# Patient Record
Sex: Female | Born: 1935
Health system: Southern US, Community
[De-identification: ages and names within clinical notes are randomized; demographics above are authoritative.]

## PROBLEM LIST (undated history)

## (undated) DIAGNOSIS — Z950 Presence of cardiac pacemaker: Principal | ICD-10-CM

## (undated) DIAGNOSIS — Z9181 History of falling: Secondary | ICD-10-CM

## (undated) DIAGNOSIS — F0390 Unspecified dementia without behavioral disturbance: Secondary | ICD-10-CM

## (undated) DIAGNOSIS — I4891 Unspecified atrial fibrillation: Secondary | ICD-10-CM

## (undated) DIAGNOSIS — E039 Hypothyroidism, unspecified: Secondary | ICD-10-CM

## (undated) DIAGNOSIS — R55 Syncope and collapse: Secondary | ICD-10-CM

## (undated) DIAGNOSIS — D649 Anemia, unspecified: Secondary | ICD-10-CM

## (undated) DIAGNOSIS — I10 Essential (primary) hypertension: Secondary | ICD-10-CM

## (undated) DIAGNOSIS — Z9289 Personal history of other medical treatment: Secondary | ICD-10-CM

## (undated) DIAGNOSIS — M1990 Unspecified osteoarthritis, unspecified site: Secondary | ICD-10-CM

## (undated) HISTORY — DX: Presence of cardiac pacemaker: Z95.0

## (undated) HISTORY — PX: PACEMAKER INSERTION: SHX728

## (undated) HISTORY — PX: ABDOMINAL HYSTERECTOMY: SHX81

---

## 2011-11-04 ENCOUNTER — Encounter (HOSPITAL_BASED_OUTPATIENT_CLINIC_OR_DEPARTMENT_OTHER): Payer: Self-pay

## 2011-12-02 ENCOUNTER — Ambulatory Visit (HOSPITAL_BASED_OUTPATIENT_CLINIC_OR_DEPARTMENT_OTHER): Payer: Self-pay

## 2012-09-21 ENCOUNTER — Other Ambulatory Visit: Payer: Self-pay | Admitting: *Deleted

## 2012-09-21 DIAGNOSIS — R011 Cardiac murmur, unspecified: Secondary | ICD-10-CM

## 2012-10-02 ENCOUNTER — Ambulatory Visit (HOSPITAL_COMMUNITY)
Admission: RE | Admit: 2012-10-02 | Discharge: 2012-10-02 | Disposition: A | Discharge status: Home / Self Care | Payer: Medicare Other | Source: Ambulatory Visit | Attending: Cardiovascular Disease | Admitting: Cardiovascular Disease

## 2012-10-02 DIAGNOSIS — R011 Cardiac murmur, unspecified: Secondary | ICD-10-CM | POA: Insufficient documentation

## 2012-10-02 DIAGNOSIS — I4891 Unspecified atrial fibrillation: Secondary | ICD-10-CM | POA: Insufficient documentation

## 2012-10-02 DIAGNOSIS — R55 Syncope and collapse: Secondary | ICD-10-CM | POA: Insufficient documentation

## 2012-10-02 NOTE — Progress Notes (Signed)
2D Echo Performed 10/02/2012    Auset Fritzler, RCS  

## 2012-10-05 ENCOUNTER — Encounter: Payer: Self-pay | Admitting: Cardiovascular Disease

## 2012-11-05 ENCOUNTER — Emergency Department (HOSPITAL_COMMUNITY): Payer: Medicare Other

## 2012-11-05 ENCOUNTER — Emergency Department (HOSPITAL_COMMUNITY)
Admission: EM | Admit: 2012-11-05 | Discharge: 2012-11-05 | Disposition: A | Discharge status: Home / Self Care | Payer: Medicare Other | Attending: Emergency Medicine | Admitting: Emergency Medicine

## 2012-11-05 ENCOUNTER — Encounter (HOSPITAL_COMMUNITY): Payer: Self-pay | Admitting: *Deleted

## 2012-11-05 DIAGNOSIS — Z880 Allergy status to penicillin: Secondary | ICD-10-CM | POA: Insufficient documentation

## 2012-11-05 DIAGNOSIS — R51 Headache: Secondary | ICD-10-CM | POA: Insufficient documentation

## 2012-11-05 DIAGNOSIS — Z950 Presence of cardiac pacemaker: Secondary | ICD-10-CM | POA: Insufficient documentation

## 2012-11-05 DIAGNOSIS — R42 Dizziness and giddiness: Secondary | ICD-10-CM | POA: Insufficient documentation

## 2012-11-05 DIAGNOSIS — Z79899 Other long term (current) drug therapy: Secondary | ICD-10-CM | POA: Insufficient documentation

## 2012-11-05 DIAGNOSIS — R000 Tachycardia, unspecified: Secondary | ICD-10-CM | POA: Insufficient documentation

## 2012-11-05 DIAGNOSIS — I4891 Unspecified atrial fibrillation: Secondary | ICD-10-CM | POA: Insufficient documentation

## 2012-11-05 DIAGNOSIS — M2550 Pain in unspecified joint: Secondary | ICD-10-CM | POA: Insufficient documentation

## 2012-11-05 DIAGNOSIS — Z7901 Long term (current) use of anticoagulants: Secondary | ICD-10-CM | POA: Insufficient documentation

## 2012-11-05 DIAGNOSIS — Z87891 Personal history of nicotine dependence: Secondary | ICD-10-CM | POA: Insufficient documentation

## 2012-11-05 DIAGNOSIS — M542 Cervicalgia: Secondary | ICD-10-CM | POA: Insufficient documentation

## 2012-11-05 DIAGNOSIS — I10 Essential (primary) hypertension: Secondary | ICD-10-CM | POA: Insufficient documentation

## 2012-11-05 HISTORY — DX: Unspecified atrial fibrillation: I48.91

## 2012-11-05 HISTORY — DX: Essential (primary) hypertension: I10

## 2012-11-05 HISTORY — DX: Syncope and collapse: R55

## 2012-11-05 LAB — CBC WITH DIFFERENTIAL/PLATELET
Basophils Absolute: 0 10*3/uL (ref 0.0–0.1)
Basophils Relative: 0 % (ref 0–1)
Eosinophils Absolute: 0.2 10*3/uL (ref 0.0–0.7)
HCT: 46.4 % — ABNORMAL HIGH (ref 36.0–46.0)
Hemoglobin: 15.7 g/dL — ABNORMAL HIGH (ref 12.0–15.0)
MCHC: 33.8 g/dL (ref 30.0–36.0)
MCV: 92.1 fL (ref 78.0–100.0)
Monocytes Absolute: 1.1 10*3/uL — ABNORMAL HIGH (ref 0.1–1.0)
Monocytes Relative: 9 % (ref 3–12)
Neutro Abs: 7 10*3/uL (ref 1.7–7.7)
Platelets: 239 10*3/uL (ref 150–400)
RDW: 13.5 % (ref 11.5–15.5)
WBC: 11.7 10*3/uL — ABNORMAL HIGH (ref 4.0–10.5)

## 2012-11-05 LAB — BASIC METABOLIC PANEL
BUN: 22 mg/dL (ref 6–23)
Calcium: 9 mg/dL (ref 8.4–10.5)
Chloride: 97 mEq/L (ref 96–112)
Creatinine, Ser: 0.81 mg/dL (ref 0.50–1.10)
GFR calc Af Amer: 71 mL/min — ABNORMAL LOW (ref >90)
GFR calc non Af Amer: 62 mL/min — ABNORMAL LOW (ref >90)
Sodium: 134 mEq/L — ABNORMAL LOW (ref 135–145)

## 2012-11-05 LAB — MAGNESIUM: Magnesium: 2.2 mg/dL (ref 1.5–2.5)

## 2012-11-05 MED ORDER — SODIUM CHLORIDE 0.9 % IV BOLUS (SEPSIS)
500.0000 mL | Freq: Once | INTRAVENOUS | Status: AC
  Administered 2012-11-05: 500 mL via INTRAVENOUS

## 2012-11-05 MED ORDER — DILTIAZEM HCL 25 MG/5ML IV SOLN
15.0000 mg | Freq: Once | INTRAVENOUS | Status: AC
  Administered 2012-11-05: 15 mg via INTRAVENOUS
  Filled 2012-11-05: qty 5

## 2012-11-05 MED ORDER — DEXTROSE 5 % IV SOLN
5.0000 mg/h | INTRAVENOUS | Status: DC
  Filled 2012-11-05: qty 100

## 2012-11-05 MED ORDER — MORPHINE SULFATE 2 MG/ML IJ SOLN
2.0000 mg | Freq: Once | INTRAMUSCULAR | Status: AC
  Administered 2012-11-05: 2 mg via INTRAVENOUS
  Filled 2012-11-05: qty 1

## 2012-11-05 NOTE — ED Notes (Signed)
Dr Alanda Amass repaged about pt. Family has inquired again about delay in getting out of dept

## 2012-11-05 NOTE — ED Notes (Signed)
EDP notified that Family and Pt. Refuse Head CT . Pt also refuses Morphine for Pain.

## 2012-11-05 NOTE — ED Notes (Signed)
Pt is on Xaralto for atrial fibrillation

## 2012-11-05 NOTE — ED Provider Notes (Signed)
CSN: [id number]     Arrival date & time 11/05/12  1456 History   First MD Initiated Contact with Patient 11/05/12 1513     Chief Complaint  Patient presents with  . Near Syncope  . Headache  . Neck Pain   (Consider location/radiation/quality/duration/timing/severity/associated sxs/prior Treatment) HPI Comments: 79 yo female with pacemaker placed 2 yrs prior, a fib hx, on xaralto, pacemaker checked 3 wks ago and was okay per pt, Dr Alanda Amass cardio and presents with fatigue and lightheaded for 2 days.  She has had chronic neck pain since fusion/ arthritis however left neck pain worsening and left HA at base new recently, gradual onset.  No new injuries. No syncope. Pt felt similar prior to pacemaker. No cp or sob.  No recent surgery.  Patient is a 79 y.o. female presenting with headaches and neck pain. The history is provided by the patient.  Headache Associated symptoms: fatigue and neck pain   Associated symptoms: no abdominal pain, no back pain, no fever, no neck stiffness, no numbness and no vomiting   Neck Pain Associated symptoms: headaches   Associated symptoms: no chest pain, no fever, no numbness and no weakness     Past Medical History  Diagnosis Date  . Hypertension   . Atrial fibrillation   . Syncope    Past Surgical History  Procedure Laterality Date  . Pacemaker insertion    . Abdominal hysterectomy     No family history on file. History  Substance Use Topics  . Smoking status: Former Games developer  . Smokeless tobacco: Not on file  . Alcohol Use: Yes     Comment: occ   OB History   Grav Para Term Preterm Abortions TAB SAB Ect Mult Living                 Review of Systems  Constitutional: Positive for fatigue. Negative for fever and chills.  HENT: Positive for neck pain. Negative for neck stiffness.   Eyes: Negative for visual disturbance.  Respiratory: Negative for shortness of breath.   Cardiovascular: Negative for chest pain.  Gastrointestinal: Negative for  vomiting and abdominal pain.  Genitourinary: Negative for dysuria and flank pain.  Musculoskeletal: Positive for arthralgias. Negative for back pain.  Skin: Negative for rash.  Neurological: Positive for light-headedness and headaches. Negative for speech difficulty, weakness and numbness.    Allergies  Penicillins  Home Medications   Current Outpatient Rx  Name  Route  Sig  Dispense  Refill  . atenolol (TENORMIN) 100 MG tablet   Oral   Take 100 mg by mouth daily.         . Rivaroxaban (XARELTO) 20 MG TABS tablet   Oral   Take 20 mg by mouth daily.         . valsartan (DIOVAN) 40 MG tablet   Oral   Take 40 mg by mouth daily.          BP 148/88  Pulse 89  Temp(Src) 97.4 F (36.3 C) (Oral)  Resp 20  SpO2 97% Physical Exam  Nursing note and vitals reviewed. Constitutional: She is oriented to person, place, and time. She appears well-developed and well-nourished.  HENT:  Head: Normocephalic and atraumatic.  Eyes: Conjunctivae are normal. Right eye exhibits no discharge. Left eye exhibits no discharge.  Neck: Normal range of motion. Neck supple. No tracheal deviation present.  Cardiovascular: An irregularly irregular rhythm present. Tachycardia present.   Pulses:      Radial pulses are 2+  on the right side, and 2+ on the left side.  Pulmonary/Chest: Effort normal and breath sounds normal.  Abdominal: Soft. She exhibits no distension. There is no tenderness. There is no guarding.  Musculoskeletal: She exhibits tenderness (left paraspinal neck). She exhibits no edema.  Neurological: She is alert and oriented to person, place, and time. No cranial nerve deficit.  Skin: Skin is warm. No rash noted.  Psychiatric: She has a normal mood and affect.    ED Course  Procedures (including critical care time) Labs Review Labs Reviewed  CBC WITH DIFFERENTIAL - Abnormal; Notable for the following:    WBC 11.7 (*)    Hemoglobin 15.7 (*)    HCT 46.4 (*)    Monocytes  Absolute 1.1 (*)    All other components within normal limits  BASIC METABOLIC PANEL - Abnormal; Notable for the following:    Sodium 134 (*)    Glucose, Bld 104 (*)    GFR calc non Af Amer 62 (*)    GFR calc Af Amer 71 (*)    All other components within normal limits  MAGNESIUM  TROPONIN I   Imaging Review No results found.  MDM  No diagnosis found. Concern for a fib and/ or pacemaker malfunction.   Date: 11/05/2012  Rate: 132  Rhythm: atrial fibrillation  QRS Axis: left  Intervals: QT prolonged  ST/T Wave abnormalities: nonspecific ST changes  Conduction Disutrbances:irregular, occcasional atrial spikes  Narrative Interpretation:   Old EKG Reviewed: changes noted   Cardizem bolus improved hr to 70s, pt sxs resolved Fluid bolus and cardio consult.   Rhythm strip reviewed HR varying 120 to 140s, no acute ST changes.   Pt on xaralto. Dg Chest Portable 1 View  11/05/2012   CLINICAL DATA:  Near syncope. Headache and neck pain  EXAM: PORTABLE CHEST - 1 VIEW  COMPARISON:  None  FINDINGS: Dual lead pacemaker in satisfactory position. Heart size upper normal. Negative for heart failure. Negative for mass, pneumonia, or effusion. Lungs are hyperinflated.  IMPRESSION: COPD. No acute abnormality.   Electronically Signed   By: Marlan Palau M.D.   On: 11/05/2012 16:16   Spoke with cardiology on call for [facility], recommended outpt fup in next two days, take medicines as directed.  Pt does not want to be observed for hr increase, family will ensure close fup.  DC   Enid Skeens, MD 11/05/12 1757

## 2012-11-05 NOTE — ED Notes (Signed)
Dr Jodi Mourning in to speak with pt and her family about dispo

## 2012-11-05 NOTE — ED Notes (Signed)
Pt was out of town yesterday and did not want to eat and slept.  Pt got up to shower and started feeling like she was going to pass out.  Pt feels like when she stands up she is going to pass out.  Pt complaining of headache this am from left posterior head down into her neck.  Pt has pacemaker

## 2012-11-05 NOTE — ED Notes (Signed)
Pt moved to pod c for further observation and to have her ct's done

## 2012-11-06 ENCOUNTER — Ambulatory Visit (INDEPENDENT_AMBULATORY_CARE_PROVIDER_SITE_OTHER): Payer: Medicare Other | Admitting: Cardiology

## 2012-11-06 ENCOUNTER — Telehealth (HOSPITAL_COMMUNITY): Payer: Self-pay | Admitting: Cardiovascular Disease

## 2012-11-06 ENCOUNTER — Encounter: Payer: Self-pay | Admitting: Cardiology

## 2012-11-06 ENCOUNTER — Ambulatory Visit: Payer: Medicare Other | Admitting: Cardiology

## 2012-11-06 VITALS — BP 126/72 | HR 130 | Ht 67.0 in | Wt 109.1 lb

## 2012-11-06 DIAGNOSIS — R000 Tachycardia, unspecified: Secondary | ICD-10-CM

## 2012-11-06 DIAGNOSIS — I4891 Unspecified atrial fibrillation: Secondary | ICD-10-CM

## 2012-11-06 DIAGNOSIS — I480 Paroxysmal atrial fibrillation: Secondary | ICD-10-CM

## 2012-11-06 LAB — PACEMAKER DEVICE OBSERVATION

## 2012-11-06 MED ORDER — ATENOLOL 100 MG PO TABS: 150.0000 mg | ORAL_TABLET | Freq: Every day | ORAL | Status: DC

## 2012-11-06 MED ORDER — ATENOLOL 100 MG PO TABS: 150.0000 mg | ORAL_TABLET | Freq: Two times a day (BID) | ORAL | Status: DC

## 2012-11-06 NOTE — Assessment & Plan Note (Addendum)
Was seen in the ER, treated but today her HR is 130-150.  She is on Xarelto.  Atenolol increased to 100 BID today  And will begin 150 mg daily tomorrow.  She will follow up in 2 weeks unless further problems.  We did discuss admitting but both the pt and her son did not want her to go back to the hospital.

## 2012-11-06 NOTE — Patient Instructions (Addendum)
Atenolol increase to 150 mg daily,  But for today only take a total of 200 mg.    Follow up, we will call you.  2 weeks with an extender.

## 2012-11-06 NOTE — Progress Notes (Signed)
Pt was seen in the ER yesterday and after cardizem bolus with decrease of new atrial fib to rate controlled a fib of 70 from 150, pt was discharged.  She is on Xarelto.  Today she is in a fib with RVR.  Pacemaker interrogated.  Dr.Weintraub here, he saw the pt and assumed care.  We increased her atenolol and she will follow up in 2 weeks unless further problems.

## 2012-11-06 NOTE — Telephone Encounter (Signed)
Patient seen in ER yesterday with increased heart rate.  Was given medication to slow it down and told to follow up today.  Mr. Fuerte says patient is dizzy and gets faint when she tries to move around.  Not sure he can get her here.

## 2012-11-06 NOTE — Telephone Encounter (Signed)
Returned call to Mr. Fiscus.  Bad connection.  Called [phone] per request.  Stated pt seen in ER yesterday for high HR (120s-150s).  Stated she was given fluids and meds and HR down to 70s-80s.  Stated pt has a pacemaker and isn't sure if something is going on with it.  Stated only new change in pt's life is being started on Valsartan at her last appt and taking prednisone for neck pain.  Stated BP has been in 140s.  Stated pt still c/o dizziness.  Informed pt should be evaluated and offered appt today w/ PA since Dr. Alanda Amass has no openings.  Agreed and appt scheduled w/ Corine Shelter, PA-C at 2:20pm for evaluation.  Mr. Wieczorek agreed to call back with any changes in pt's condition.  Informed Dr. Alanda Amass will be notified as well.  Verbalized understanding.  Available on mobile number if needed throughout the day.    [phone]

## 2012-11-10 ENCOUNTER — Telehealth: Payer: Self-pay | Admitting: Physician Assistant

## 2012-11-10 ENCOUNTER — Telehealth: Payer: Self-pay | Admitting: Cardiology

## 2012-11-10 NOTE — Telephone Encounter (Signed)
Needs some additional information about her visit with Nada Boozer, NP on Monday of this week.  Her medications were changed, but she has the dizziness.  Please advise.

## 2012-11-10 NOTE — Telephone Encounter (Signed)
BP and HR look good.   Continue current atenolol dosing.  Stay hydrated.  Follow up with Ms. Ingold as scheduled.  Antero Derosia 2:40 PM

## 2012-11-10 NOTE — Telephone Encounter (Signed)
Returned call to [place], pt's daughter-in-law.  Statd pt's med was increased at last visit (atenolol) and pt has been c/o "head feels swimmy" all week.  Stated pt c/o feeling light-headed when she's walking sometimes.  Denied checking pt's BP, but does have the means.  RN advised she check pt's BP when she goes to see her for lunch and call back w/ BP and HR.  Orthostatic instructions given as well since she described pt w/ some orthostatic symptoms.  Also advised increase in water intake as dehydration can cause similar symptoms.  F/u appt scheduled for Tuesday, 9.30.14, at 2:20pm w/ Vernona Rieger, NP.    Eunice Blase will call back w/ BP and HR so nurse/CMA can notify provider today for advice on meds.

## 2012-11-10 NOTE — Telephone Encounter (Signed)
Opened in error

## 2012-11-10 NOTE — Telephone Encounter (Signed)
Blood pressure reading 114/96 pulse is 77

## 2012-11-14 ENCOUNTER — Ambulatory Visit: Payer: Medicare Other | Admitting: Cardiology

## 2012-11-20 ENCOUNTER — Ambulatory Visit (INDEPENDENT_AMBULATORY_CARE_PROVIDER_SITE_OTHER): Payer: Medicare Other | Admitting: Cardiology

## 2012-11-20 ENCOUNTER — Encounter: Payer: Self-pay | Admitting: Cardiology

## 2012-11-20 VITALS — BP 132/80 | HR 85 | Ht 67.0 in | Wt 110.0 lb

## 2012-11-20 DIAGNOSIS — I480 Paroxysmal atrial fibrillation: Secondary | ICD-10-CM

## 2012-11-20 DIAGNOSIS — Z7901 Long term (current) use of anticoagulants: Secondary | ICD-10-CM | POA: Insufficient documentation

## 2012-11-20 DIAGNOSIS — I4891 Unspecified atrial fibrillation: Secondary | ICD-10-CM

## 2012-11-20 NOTE — Assessment & Plan Note (Addendum)
Now back in SR.   BP stable with increased dose of BB.  No complaints today.

## 2012-11-20 NOTE — Progress Notes (Signed)
          11/20/2012   PCP: Delorse Lek, MD   Chief Complaint  Patient presents with  . Follow-up    2 week follow up    Primary Cardiologist: Now Dr. Eula Fried, Had been Dr. Alanda Amass.  HPI:79 year old was seen last week for afib with RVR, she was very weak and off balance.  She had been to the ER the night before the visit and IV cardizem slowed her rate, but on arrival in office it was elevated again.  She was seen and evaluated by Dr. Alanda Amass.  He discussed with her son hospitalization, but the pt did not want to be admitted.  We increased her atenolol and she has since converted to SR.   She has PPM for sick sinus syndrome.  She is also on Xarelto for anticoagulation.  Today she has no complaints.  Feels and looks much better.  No chest pain, no shortness of breath.   Allergies  Allergen Reactions  . Penicillins Other (See Comments)    unknown    Current Outpatient Prescriptions  Medication Sig Dispense Refill  . atenolol (TENORMIN) 100 MG tablet Take 1.5 tablets (150 mg total) by mouth daily.  150 tablet  3  . Rivaroxaban (XARELTO) 20 MG TABS tablet Take 15 mg by mouth daily.       . valsartan (DIOVAN) 40 MG tablet Take 40 mg by mouth daily.       No current facility-administered medications for this visit.    Past Medical History  Diagnosis Date  . Hypertension   . Atrial fibrillation   . Syncope     Past Surgical History  Procedure Laterality Date  . Pacemaker insertion    . Abdominal hysterectomy      OZH:YQMVHQI:ON colds or fevers, no weight changes, feels much better Skin:no rashes or ulcers HEENT:no blurred vision, no congestion CV:see HPI PUL:see HPI GI:no diarrhea constipation or melena, no indigestion GU:no hematuria, no dysuria MS:no joint pain, no claudication Neuro:no syncope, no lightheadedness Endo:no diabetes, no thyroid disease  PHYSICAL EXAM BP 132/80  Pulse 85  Ht 5\' 7"  (1.702 m)  Wt 110 lb (49.896 kg)  BMI 17.22  kg/m2 General:Pleasant affect, NAD Skin:Warm and dry, brisk capillary refill HEENT:normocephalic, sclera clear, mucus membranes moist Heart:S1S2 RRR with soft systolic murmur, no gallup, rub or click Lungs:clear without rales, rhonchi, or wheezes GEX:BMWU, non tender, + BS, do not palpate liver spleen or masses Neuro:alert and oriented, MAE, follows commands, + facial symmetry XLK:GMWNUU, SR rate controlled.  ASSESSMENT AND PLAN PAF (paroxysmal atrial fibrillation) Now back in SR.   BP stable with increased dose of BB.  No complaints today.   She will follow up with Dr. Royann Shivers in 2 months unless problems in the meantime.

## 2012-11-20 NOTE — Patient Instructions (Signed)
You are now in regular Sinus rhythm with pacing normally.  We will have you follow up with Dr. Royann Shivers in 2 months for follow up.

## 2012-12-19 ENCOUNTER — Telehealth: Payer: Self-pay | Admitting: Internal Medicine

## 2012-12-19 NOTE — Telephone Encounter (Signed)
Pt has appt with Dr C on 01-22-13/mt

## 2013-01-08 ENCOUNTER — Encounter: Payer: Self-pay | Admitting: *Deleted

## 2013-01-22 ENCOUNTER — Ambulatory Visit (INDEPENDENT_AMBULATORY_CARE_PROVIDER_SITE_OTHER): Payer: Medicare Other | Admitting: Cardiovascular Disease

## 2013-01-22 ENCOUNTER — Encounter: Payer: Self-pay | Admitting: Cardiovascular Disease

## 2013-01-22 VITALS — BP 150/88 | HR 76 | Resp 20 | Ht 67.0 in | Wt 113.4 lb

## 2013-01-22 DIAGNOSIS — I4891 Unspecified atrial fibrillation: Secondary | ICD-10-CM

## 2013-01-22 DIAGNOSIS — Z950 Presence of cardiac pacemaker: Secondary | ICD-10-CM

## 2013-01-22 DIAGNOSIS — I495 Sick sinus syndrome: Secondary | ICD-10-CM

## 2013-01-22 DIAGNOSIS — I480 Paroxysmal atrial fibrillation: Secondary | ICD-10-CM

## 2013-01-22 LAB — MDC_IDC_ENUM_SESS_TYPE_INCLINIC
Lead Channel Impedance Value: 456 Ohm
Lead Channel Pacing Threshold Amplitude: 0.9 V
Lead Channel Pacing Threshold Pulse Width: 0.4 ms
Lead Channel Sensing Intrinsic Amplitude: 2 mV
Lead Channel Sensing Intrinsic Amplitude: 4.8 mV
Lead Channel Setting Sensing Sensitivity: 1 mV
Zone Setting Detection Interval: 316 ms

## 2013-01-22 LAB — PACEMAKER DEVICE OBSERVATION

## 2013-01-22 NOTE — Patient Instructions (Signed)
Remote monitoring is used to monitor your pacemaker from home. This monitoring reduces the number of office visits required to check your device to one time per year. It allows [place] to keep an eye on the functioning of your device to ensure it is working properly. You are scheduled for a device check from home on 04-25-2013. You may send your transmission at any time that day. If you have a wireless device, the transmission will be sent automatically. After your physician reviews your transmission, you will receive a postcard with your next transmission date.  Your physician recommends that you schedule a follow-up appointment in: 6 months

## 2013-01-25 ENCOUNTER — Encounter: Payer: Self-pay | Admitting: Cardiovascular Disease

## 2013-01-25 DIAGNOSIS — Z950 Presence of cardiac pacemaker: Secondary | ICD-10-CM

## 2013-01-25 HISTORY — DX: Presence of cardiac pacemaker: Z95.0

## 2013-01-25 NOTE — Progress Notes (Signed)
Patient ID: Jamie Daugherty, female   DOB: 17-Dec-1918, 79 y.o.   MRN: [id number]      Reason for office visit Atrial fibrillation, pacemaker followup  Jamie Daugherty is a former patient of Dr. Susa Griffins. She is accompanied by her son. She is now almost 47 years old. She appears relatively frail but takes care of most of her day-to-day activities independently. She had a syncopal event roughly 1-1/2 years ago and was found to have atrial flutter ablation rapid ventricular response. Medications list to marked symptomatic bradycardia and she received a Administrator in July of 2013. Since that time she has not had syncope. Jamie Daugherty saw Dr. Alanda Amass she was in a protracted episode of atrial fibrillation but this has subsequently resolved. The overall burden of atrial fibrillation is only around 1%. Rate control is good. A few episodes of nonsustained ventricular tachycardia have been recorded, all very brief. She has not had any bleeding problems and has not had falls or injuries. He does not have a history of stroke or other embolic events. Echocardiography performed in August showed normal left ventricular systolic function, moderate mitral insufficiency and a moderately dilated left atrium(the diameter was only 39 mm, but the volume was 40 mL per meter squared BSA)   Allergies  Allergen Reactions  . Penicillins Other (See Comments)    unknown    Current Outpatient Prescriptions  Medication Sig Dispense Refill  . atenolol (TENORMIN) 100 MG tablet Take 1.5 tablets (150 mg total) by mouth daily.  150 tablet  3  . Rivaroxaban (XARELTO) 20 MG TABS tablet Take 15 mg by mouth daily.       . valsartan (DIOVAN) 40 MG tablet Take 40 mg by mouth daily.       No current facility-administered medications for this visit.    Past Medical History  Diagnosis Date  . Hypertension   . Atrial fibrillation   . Syncope   . Pacemaker - dual chamber [facility] implanted  2013 01/25/2013    Past Surgical History  Procedure Laterality Date  . Pacemaker insertion    . Abdominal hysterectomy      Family History  Problem Relation Age of Onset  . Heart disease Mother   . Heart disease Father     History   Social History  . Marital Status: Unknown    Spouse Name: N/A    Number of Children: N/A  . Years of Education: N/A   Occupational History  . Not on file.   Social History Main Topics  . Smoking status: Former Games developer  . Smokeless tobacco: Not on file  . Alcohol Use: Yes     Comment: occ  . Drug Use: No  . Sexual Activity: Not on file   Other Topics Concern  . Not on file   Social History Narrative  . No narrative on file  Review of systems: The patient specifically denies any chest pain at rest or with exertion, dyspnea at rest or with exertion, orthopnea, paroxysmal nocturnal dyspnea, syncope, palpitations, focal neurological deficits, intermittent claudication, lower extremity edema, unexplained weight gain, cough, hemoptysis or wheezing.  The patient also denies abdominal pain, nausea, vomiting, dysphagia, diarrhea, constipation, polyuria, polydipsia, dysuria, hematuria, frequency, urgency, abnormal bleeding or bruising, fever, chills, unexpected weight changes, mood swings, change in skin or hair texture, change in voice quality, auditory or visual problems, allergic reactions or rashes, new musculoskeletal complaints other than usual "aches and pains".   PHYSICAL EXAM BP 150/88  Pulse 76  Resp 20  Ht 5\' 7"  (1.702 m)  Wt 113 lb 6.4 oz (51.438 kg)  BMI 17.76 kg/m2  General: Alert, oriented x3, no distress, very slender and frail appearing Head: no evidence of trauma, PERRL, EOMI, no exophtalmos or lid lag, no myxedema, no xanthelasma; normal ears, nose and oropharynx Neck: normal jugular venous pulsations and no hepatojugular reflux; brisk carotid pulses without delay and no carotid bruits Chest: clear to auscultation, no signs  of consolidation by percussion or palpation, normal fremitus, symmetrical and full respiratory excursions; they didn't pacemaker site appears healthy Cardiovascular: normal position and quality of the apical impulse, regular rhythm, normal first and second heart sounds, no murmurs, rubs or gallops Abdomen: no tenderness or distention, no masses by palpation, no abnormal pulsatility or arterial bruits, normal bowel sounds, no hepatosplenomegaly Extremities: no clubbing, cyanosis or edema; 2+ radial, ulnar and brachial pulses bilaterally; 2+ right femoral, posterior tibial and dorsalis pedis pulses; 2+ left femoral, posterior tibial and dorsalis pedis pulses; no subclavian or femoral bruits Neurological: grossly nonfocal   EKG: Atrial paced ventricular sensed  Lipid Panel  No results found for this basename: chol, trig, hdl, cholhdl, vldl, ldlcalc    BMET    Component Value Date/Time   NA 134* 11/05/2012 1606   K 3.5 11/05/2012 1606   CL 97 11/05/2012 1606   CO2 26 11/05/2012 1606   GLUCOSE 104* 11/05/2012 1606   BUN 22 11/05/2012 1606   CREATININE 0.81 11/05/2012 1606   CALCIUM 9.0 11/05/2012 1606   GFRNONAA 62* 11/05/2012 1606   GFRAA 71* 11/05/2012 1606     ASSESSMENT AND PLAN Pacemaker - dual chamber [employer] implanted 2013 Pacemaker check in clinic. Normal device function. Thresholds and impedances consistent with previous measurements. Device programmed to maximize longevity.  Roughly 65% atrial pacing and 25% ventricular pacing. (42) mode switch episodes <1 min, (10) lasting 1 min-1 hr, (4) lasting 1hr-24 hrs, (2) lasting 24hrs-48hrs, (1)>48 hours that occurred in September. 3 high ventricular rates noted---NSVT noted x 10bts.  RV auto threshold was D/C after an inaccurate reading. A fixed output of 2.5V @ 0.66ms was programmed.  Ventricularensitivity was also increased to 1.91mV after measuring an R wave of only 2.71mV.  Histogram distribution appropriate for patient activity  level. Device programmed to optimize intrinsic conduction.  Estimated longevity 6 years. Patient will follow up remotely on 04-25-2013 and with MD in 6 months.   PAF (paroxysmal atrial fibrillation) Overall burden of atrial fibrillation appears to be low and it is not particular symptomatic. Ventricular rate control is good. She is on appropriate anticoagulation therapy. We discussed the fact that the anticoagulant should be discontinued 24 hours before minor procedures that involve bleeding risk and 48 hours before major procedures.  Orders Placed This Encounter  Procedures  . Implantable device check   Donnie Gedeon  Thurmon Fair, MD, [facility] [phone] office [phone] pager

## 2013-01-25 NOTE — Assessment & Plan Note (Signed)
Overall burden of atrial fibrillation appears to be low and it is not particular symptomatic. Ventricular rate control is good. She is on appropriate anticoagulation therapy. We discussed the fact that the anticoagulant should be discontinued 24 hours before minor procedures that involve bleeding risk and 48 hours before major procedures.

## 2013-01-25 NOTE — Assessment & Plan Note (Signed)
Pacemaker check in clinic. Normal device function. Thresholds and impedances consistent with previous measurements. Device programmed to maximize longevity.  Roughly 65% atrial pacing and 25% ventricular pacing. (42) mode switch episodes <1 min, (10) lasting 1 min-1 hr, (4) lasting 1hr-24 hrs, (2) lasting 24hrs-48hrs, (1)>48 hours that occurred in September. 3 high ventricular rates noted---NSVT noted x 10bts.  RV auto threshold was D/C after an inaccurate reading. A fixed output of 2.5V @ 0.75ms was programmed.  Ventricularensitivity was also increased to 1.37mV after measuring an R wave of only 2.39mV.  Histogram distribution appropriate for patient activity level. Device programmed to optimize intrinsic conduction.  Estimated longevity 6 years. Patient will follow up remotely on 04-25-2013 and with MD in 6 months.

## 2013-03-09 LAB — PACEMAKER DEVICE OBSERVATION

## 2013-04-25 ENCOUNTER — Ambulatory Visit (INDEPENDENT_AMBULATORY_CARE_PROVIDER_SITE_OTHER): Payer: Medicare Other | Admitting: *Deleted

## 2013-04-25 DIAGNOSIS — I480 Paroxysmal atrial fibrillation: Secondary | ICD-10-CM

## 2013-04-25 DIAGNOSIS — I4891 Unspecified atrial fibrillation: Secondary | ICD-10-CM

## 2013-04-25 DIAGNOSIS — Z950 Presence of cardiac pacemaker: Secondary | ICD-10-CM

## 2013-04-25 LAB — PACEMAKER DEVICE OBSERVATION

## 2013-05-02 LAB — MDC_IDC_ENUM_SESS_TYPE_REMOTE
Brady Statistic RA Percent Paced: 64 %
Implantable Pulse Generator Serial Number: 131216
Lead Channel Impedance Value: 632 Ohm
Lead Channel Sensing Intrinsic Amplitude: 2.4 mV
Lead Channel Setting Pacing Amplitude: 2.5 V
Lead Channel Setting Pacing Amplitude: 2.6 V
Lead Channel Setting Pacing Pulse Width: 0.4 ms
Lead Channel Setting Sensing Sensitivity: 1 mV
MDC IDC MSMT LEADCHNL RA SENSING INTR AMPL: 4.6 mV
MDC IDC MSMT LEADCHNL RV IMPEDANCE VALUE: 391 Ohm
MDC IDC SET ZONE DETECTION INTERVAL: 316 ms
MDC IDC STAT BRADY RV PERCENT PACED: 6 %

## 2013-05-18 ENCOUNTER — Encounter: Payer: Self-pay | Admitting: *Deleted

## 2013-06-21 ENCOUNTER — Telehealth: Payer: Self-pay | Admitting: Cardiovascular Disease

## 2013-06-21 MED ORDER — RIVAROXABAN 15 MG PO TABS: 15.0000 mg | ORAL_TABLET | Freq: Two times a day (BID) | ORAL | Status: DC

## 2013-06-21 NOTE — Telephone Encounter (Signed)
Rx was sent to pharmacy electronically. 

## 2013-06-21 NOTE — Telephone Encounter (Signed)
Need refill on Xarelto 15 mg #90

## 2013-07-17 ENCOUNTER — Ambulatory Visit (INDEPENDENT_AMBULATORY_CARE_PROVIDER_SITE_OTHER): Payer: Medicare Other | Admitting: Cardiovascular Disease

## 2013-07-17 VITALS — BP 122/86 | HR 98 | Resp 16 | Ht 67.0 in | Wt 110.9 lb

## 2013-07-17 DIAGNOSIS — I480 Paroxysmal atrial fibrillation: Secondary | ICD-10-CM

## 2013-07-17 DIAGNOSIS — Z950 Presence of cardiac pacemaker: Secondary | ICD-10-CM

## 2013-07-17 DIAGNOSIS — I4891 Unspecified atrial fibrillation: Secondary | ICD-10-CM

## 2013-07-17 LAB — MDC_IDC_ENUM_SESS_TYPE_INCLINIC
Brady Statistic RA Percent Paced: 59 %
Brady Statistic RV Percent Paced: 8 %
Implantable Pulse Generator Serial Number: 131216
Lead Channel Impedance Value: 695 Ohm
Lead Channel Pacing Threshold Amplitude: 0.7 V
Lead Channel Pacing Threshold Amplitude: 0.9 V
Lead Channel Pacing Threshold Pulse Width: 0.4 ms
Lead Channel Sensing Intrinsic Amplitude: 6.2 mV
Lead Channel Setting Pacing Amplitude: 2.5 V
Lead Channel Setting Pacing Amplitude: 2.6 V
MDC IDC MSMT LEADCHNL RV IMPEDANCE VALUE: 435 Ohm
MDC IDC MSMT LEADCHNL RV PACING THRESHOLD PULSEWIDTH: 0.4 ms
MDC IDC MSMT LEADCHNL RV SENSING INTR AMPL: 7.1 mV
MDC IDC SESS DTM: 20150602040000
MDC IDC SET LEADCHNL RV PACING PULSEWIDTH: 0.4 ms
MDC IDC SET LEADCHNL RV SENSING SENSITIVITY: 1 mV
Zone Setting Detection Interval: 316 ms

## 2013-07-17 NOTE — Patient Instructions (Signed)
Remote monitoring is used to monitor your pacemaker from home. This monitoring reduces the number of office visits required to check your device to one time per year. It allows [place] to keep an eye on the functioning of your device to ensure it is working properly. You are scheduled for a device check from home on 10-18-2013. You may send your transmission at any time that day. If you have a wireless device, the transmission will be sent automatically. After your physician reviews your transmission, you will receive a postcard with your next transmission date.  Your physician recommends that you schedule a follow-up appointment in: 12 months with Dr.Croitoru

## 2013-07-20 ENCOUNTER — Encounter: Payer: Self-pay | Admitting: Cardiovascular Disease

## 2013-07-20 NOTE — Progress Notes (Signed)
Patient ID: Jamie Daugherty, female   DOB: 18-Jul-1918, 80 y.o.   MRN: [id number]      Reason for office visit Atrial fibrillation, pacemaker follow up  She had a syncopal event roughly 2 years ago and was found to have atrial fibrillation with rapid ventricular response. Medications caused marked symptomatic bradycardia and she received a Holiday representative in July of 2013. Since that time she has not had syncope. The overall burden of atrial fibrillation is only around 1%. Rate control is good. A few episodes of nonsustained ventricular tachycardia have been recorded, all very brief.  She has not had any bleeding problems and has not had falls or injuries. She does not have a history of stroke or other embolic events. Echocardiography performed in August 2014 showed normal left ventricular systolic function, moderate mitral insufficiency and a moderately dilated left atrium(the diameter was only 39 mm, but the volume was 40 mL per meter squared BSA).  She has no complaints. She has an increased burden of PVCs but is minimally symptomatic. Allergies  Allergen Reactions  . Penicillins Other (See Comments)    unknown    Current Outpatient Prescriptions  Medication Sig Dispense Refill  . atenolol (TENORMIN) 100 MG tablet Take 1.5 tablets (150 mg total) by mouth daily.  150 tablet  3  . Rivaroxaban (XARELTO) 15 MG TABS tablet Take 15 mg by mouth daily.      . valsartan (DIOVAN) 40 MG tablet Take 40 mg by mouth daily.       No current facility-administered medications for this visit.    Past Medical History  Diagnosis Date  . Hypertension   . Atrial fibrillation   . Syncope   . Pacemaker - dual chamber [facility] implanted 2013 01/25/2013    Past Surgical History  Procedure Laterality Date  . Pacemaker insertion    . Abdominal hysterectomy      Family History  Problem Relation Age of Onset  . Heart disease Mother   . Heart disease Father      History   Social History  . Marital Status: Unknown    Spouse Name: N/A    Number of Children: N/A  . Years of Education: N/A   Occupational History  . Not on file.   Social History Main Topics  . Smoking status: Former Research scientist (life sciences)  . Smokeless tobacco: Not on file  . Alcohol Use: Yes     Comment: occ  . Drug Use: No  . Sexual Activity: Not on file   Other Topics Concern  . Not on file   Social History Narrative  . No narrative on file    Review of systems: The patient specifically denies any chest pain at rest or with exertion, dyspnea at rest or with exertion, orthopnea, paroxysmal nocturnal dyspnea, syncope, palpitations, focal neurological deficits, intermittent claudication, lower extremity edema, unexplained weight gain, cough, hemoptysis or wheezing.  The patient also denies abdominal pain, nausea, vomiting, dysphagia, diarrhea, constipation, polyuria, polydipsia, dysuria, hematuria, frequency, urgency, abnormal bleeding or bruising, fever, chills, unexpected weight changes, mood swings, change in skin or hair texture, change in voice quality, auditory or visual problems, allergic reactions or rashes, new musculoskeletal complaints other than usual "aches and pains".   PHYSICAL EXAM BP 122/86  Pulse 98  Resp 16  Ht 5\' 7"  (1.702 m)  Wt 110 lb 14.4 oz (50.304 kg)  BMI 17.37 kg/m2 General: Alert, oriented x3, no distress, very slender and frail appearing  Head: no  evidence of trauma, PERRL, EOMI, no exophtalmos or lid lag, no myxedema, no xanthelasma; normal ears, nose and oropharynx  Neck: normal jugular venous pulsations and no hepatojugular reflux; brisk carotid pulses without delay and no carotid bruits  Chest: clear to auscultation, no signs of consolidation by percussion or palpation, normal fremitus, symmetrical and full respiratory excursions; they didn't pacemaker site appears healthy  Cardiovascular: normal position and quality of the apical impulse, regular  rhythm, normal first and second heart sounds, no murmurs, rubs or gallops  Abdomen: no tenderness or distention, no masses by palpation, no abnormal pulsatility or arterial bruits, normal bowel sounds, no hepatosplenomegaly  Extremities: no clubbing, cyanosis or edema; 2+ radial, ulnar and brachial pulses bilaterally; 2+ right femoral, posterior tibial and dorsalis pedis pulses; 2+ left femoral, posterior tibial and dorsalis pedis pulses; no subclavian or femoral bruits  Neurological: grossly nonfocal   EKG: Atrial paced ventricular sensed, LAFB. PVCs.  BMET    Component Value Date/Time   NA 134* 11/05/2012 1606   K 3.5 11/05/2012 1606   CL 97 11/05/2012 1606   CO2 26 11/05/2012 1606   GLUCOSE 104* 11/05/2012 1606   BUN 22 11/05/2012 1606   CREATININE 0.81 11/05/2012 1606   CALCIUM 9.0 11/05/2012 1606   GFRNONAA 62* 11/05/2012 1606   GFRAA 71* 11/05/2012 1606     ASSESSMENT AND PLAN  Pacemaker check in clinic. Normal device function.  Overall AFib burden 9%, onXarelto. 83 NSVT episodes noted---max dur. 5 bts per EGMs. 3.51M PVCs recorded x 51mos. Histogram distribution appropriate for patient activity level. Device programmed to  optimize intrinsic conduction. Estimated battery longevity 6 years. Patient will follow up via Latitude on 9-3 and with MD in 12 months.  Patient Instructions  Remote monitoring is used to monitor your pacemaker from home. This monitoring reduces the number of office visits required to check your device to one time per year. It allows [place] to keep an eye on the functioning of your device to ensure it is working properly. You are scheduled for a device check from home on 10-18-2013. You may send your transmission at any time that day. If you have a wireless device, the transmission will be sent automatically. After your physician reviews your transmission, you will receive a postcard with your next transmission date.  Your physician recommends that you schedule a follow-up  appointment in: 12 months with Dr.Ibrahima Holberg       Orders Placed This Encounter  Procedures  . Implantable device check   Meds ordered this encounter  Medications  . Rivaroxaban (XARELTO) 15 MG TABS tablet    Sig: Take 15 mg by mouth daily.    Shawonda Kerce  Sanda Klein, MD, [facility] [phone] office [phone] pager

## 2013-09-10 ENCOUNTER — Encounter: Payer: Self-pay | Admitting: Cardiovascular Disease

## 2013-09-19 ENCOUNTER — Other Ambulatory Visit: Payer: Self-pay | Admitting: Cardiology

## 2013-09-19 NOTE — Telephone Encounter (Signed)
Rx was sent to pharmacy electronically. 

## 2013-10-18 ENCOUNTER — Encounter: Payer: Medicare Other | Admitting: *Deleted

## 2013-10-18 ENCOUNTER — Telehealth: Payer: Self-pay | Admitting: Cardiology

## 2013-10-18 NOTE — Telephone Encounter (Signed)
Spoke with pt and reminded pt of remote transmission that is due today. Pt verbalized understanding.   

## 2013-10-19 ENCOUNTER — Encounter: Payer: Self-pay | Admitting: Cardiology

## 2013-11-20 ENCOUNTER — Encounter: Payer: Self-pay | Admitting: *Deleted

## 2013-12-25 ENCOUNTER — Encounter: Payer: Self-pay | Admitting: Cardiology

## 2014-01-02 ENCOUNTER — Telehealth: Payer: Self-pay | Admitting: *Deleted

## 2014-01-02 ENCOUNTER — Telehealth: Payer: Self-pay | Admitting: Cardiovascular Disease

## 2014-01-02 NOTE — Telephone Encounter (Signed)
Pt have been feeling dizzy. She would like to check her pacemake readings to see if everything is goi all right.

## 2014-01-02 NOTE — Telephone Encounter (Signed)
Pt feeling dizzy for 3 days. Latitude shows substantial increase in AT/AF since 12/30/13. Pt takes xarelto as instructed. Appt made to follow up w/ Dr. Sallyanne Kuster 02/25/14.

## 2014-01-02 NOTE — Telephone Encounter (Signed)
Spoke to patient 's daughter in law- Debbie RN asked if blood pressure has been taking today, she states she will see patient a little later today. RN informed will send message to [place]- someone will call her back

## 2014-01-02 NOTE — Telephone Encounter (Signed)
Spoke w/ [name]. Updated her we have not received a transmission since June 2015. Gave Debbie instructions how to send a manual transmission. She will do so this afternoon w/ pt. Gave her my direct # if trouble shooting is necessary.

## 2014-01-24 ENCOUNTER — Other Ambulatory Visit: Payer: Self-pay | Admitting: Cardiology

## 2014-01-25 NOTE — Telephone Encounter (Signed)
Rx has been sent to the pharmacy electronically. ° °

## 2014-02-20 ENCOUNTER — Other Ambulatory Visit: Payer: Self-pay | Admitting: Cardiovascular Disease

## 2014-02-20 NOTE — Telephone Encounter (Signed)
Rx(s) sent to pharmacy electronically.  

## 2014-02-25 ENCOUNTER — Encounter: Payer: Self-pay | Admitting: Cardiovascular Disease

## 2014-02-25 ENCOUNTER — Ambulatory Visit (INDEPENDENT_AMBULATORY_CARE_PROVIDER_SITE_OTHER): Payer: Medicare Other | Admitting: Cardiovascular Disease

## 2014-02-25 VITALS — BP 130/81 | HR 82 | Resp 16 | Ht 67.0 in | Wt 107.3 lb

## 2014-02-25 DIAGNOSIS — Z7901 Long term (current) use of anticoagulants: Secondary | ICD-10-CM

## 2014-02-25 DIAGNOSIS — Z950 Presence of cardiac pacemaker: Secondary | ICD-10-CM

## 2014-02-25 DIAGNOSIS — I480 Paroxysmal atrial fibrillation: Secondary | ICD-10-CM

## 2014-02-25 NOTE — Patient Instructions (Signed)
Remote monitoring is used to monitor your Pacemaker from home. This monitoring reduces the number of office visits required to check your device to one time per year. It allows [place] to monitor the functioning of your device to ensure it is working properly. You are scheduled for a device check from home on May 28, 2014.   You may send your transmission at any time that day. If you have a wireless device, the transmission will be sent automatically. After your physician reviews your transmission, you will receive a postcard with your next transmission date.   Dr. Sallyanne Kuster recommends that you schedule a follow-up appointment in: One Year.

## 2014-02-26 ENCOUNTER — Encounter: Payer: Self-pay | Admitting: Cardiovascular Disease

## 2014-02-26 NOTE — Progress Notes (Signed)
Patient ID: Jamie Daugherty, female   DOB: 1918/11/29, 81 y.o.   MRN: [id number]     Reason for office visit Paroxysmal atrial fibrillation, sinus node dysfunction, pacemaker check  Mrs. Sestak is a remarkably vibrant 81 year old woman that lives independently. They she is accompanied by her daughter.  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 She has paroxysmal atrial fibrillation with rapid ventricular response. Despite a remarkably high dose of beta blocker for an elderly slender woman, she continues to have fairly rapid ventricular rates. The arrhythmia is however always asymptomatic. The prevalence is low at around 5%. She has 74% atrial pacing and only 7% ventricular pacing. All technical parameters of her [employer] dual-chamber pacemaker are within normal limits. Her blood pressure when she came in today was a little elevated at 154/88 but when rechecked it was down to 1:30/81.  She is very steady on her feet and has not had any falls. She does not have any bleeding problems. She does not have a history of stroke or TIA and denies any interim focal neurological events. Her echocardiogram performed in August 2014 showed normal left ventricular systolic function, moderate mitral insufficiency and a moderately dilated left atrium.  Her only comorbid cardiovascular condition is mild hypertension  Allergies  Allergen Reactions  . Penicillins Other (See Comments)    unknown    Current Outpatient Prescriptions  Medication Sig Dispense Refill  . atenolol (TENORMIN) 100 MG tablet Take 1.5 tablets (150 mg total) by mouth daily. 45 tablet 5  . Rivaroxaban (XARELTO) 15 MG TABS tablet Take 15 mg by mouth daily.    . valsartan (DIOVAN) 40 MG tablet TAKE 1 TABLET DAILY 90 tablet 3   No current facility-administered medications for this visit.    Past Medical History  Diagnosis Date  . Hypertension     . Atrial fibrillation   . Syncope   . Pacemaker - dual chamber [facility] implanted 2013 01/25/2013    Past Surgical History  Procedure Laterality Date  . Pacemaker insertion    . Abdominal hysterectomy      Family History  Problem Relation Age of Onset  . Heart disease Mother   . Heart disease Father     History   Social History  . Marital Status: Unknown    Spouse Name: N/A    Number of Children: N/A  . Years of Education: N/A   Occupational History  . Not on file.   Social History Main Topics  . Smoking status: Former Research scientist (life sciences)  . Smokeless tobacco: Not on file  . Alcohol Use: Yes     Comment: occ  . Drug Use: No  . Sexual Activity: Not on file   Other Topics Concern  . Not on file   Social History Narrative    Review of systems: The patient specifically denies any chest pain at rest or with exertion, dyspnea at rest or with exertion, orthopnea, paroxysmal nocturnal dyspnea, syncope, palpitations, focal neurological deficits, intermittent claudication, lower extremity edema, unexplained weight gain, cough, hemoptysis or wheezing.  The patient also denies abdominal pain, nausea, vomiting, dysphagia, diarrhea, constipation, polyuria, polydipsia, dysuria, hematuria, frequency, urgency, abnormal bleeding or bruising, fever, chills, unexpected weight changes, mood swings, change in skin or hair texture, change in voice quality, auditory or visual problems, allergic reactions or rashes, new musculoskeletal complaints other than usual "aches and pains".   PHYSICAL EXAM BP 130/81 mmHg  Pulse 82  Resp 16  Ht 5\' 7"  (1.702 m)  Wt 107 lb 4.8 oz (48.671 kg)  BMI 16.80 kg/m2 She is very very slender but does not consider herself underweight. She was very surprised when I told her that she is too thin General: Alert, oriented x3, no distress Head: no evidence of trauma, PERRL, EOMI, no exophtalmos or lid lag, no myxedema, no xanthelasma; normal ears, nose and  oropharynx Neck: normal jugular venous pulsations and no hepatojugular reflux; brisk carotid pulses without delay and no carotid bruits Chest: clear to auscultation, no signs of consolidation by percussion or palpation, normal fremitus, symmetrical and full respiratory excursions Cardiovascular: normal position and quality of the apical impulse, regular rhythm, normal first and second heart sounds, no murmurs, rubs or gallops Abdomen: no tenderness or distention, no masses by palpation, no abnormal pulsatility or arterial bruits, normal bowel sounds, no hepatosplenomegaly Extremities: no clubbing, cyanosis or edema; 2+ radial, ulnar and brachial pulses bilaterally; 2+ right femoral, posterior tibial and dorsalis pedis pulses; 2+ left femoral, posterior tibial and dorsalis pedis pulses; no subclavian or femoral bruits Neurological: grossly nonfocal   EKG: Atrial paced, ventricular sensed  Lipid Panel  No results found for: CHOL, TRIG, HDL, CHOLHDL, VLDL, LDLCALC, LDLDIRECT  BMET    Component Value Date/Time   NA 134* 11/05/2012 1606   K 3.5 11/05/2012 1606   CL 97 11/05/2012 1606   CO2 26 11/05/2012 1606   GLUCOSE 104* 11/05/2012 1606   BUN 22 11/05/2012 1606   CREATININE 0.81 11/05/2012 1606   CALCIUM 9.0 11/05/2012 1606   GFRNONAA 62* 11/05/2012 1606   GFRAA 71* 11/05/2012 1606     ASSESSMENT AND PLAN  Paroxysmal atrial fibrillation Manuela Schwartz asymptomatic and low prevalence arrhythmia. Despite the fact that she continues to have occasional problems with rapid ventricular rate, I am loath to increase the dose of beta blocker any further due to the risk of side effects. As long as she is steady on her feet and does not have serious bleeding problems, the benefit of anticoagulation still outweighs the risk of bleeding and she should continue the Xarelto.  Dual-chamber permanent pacemaker for sinus node dysfunction Normal device function. We have not received her last remote download and  need to troubleshoot the Latitude connection.  Patient Instructions  Remote monitoring is used to monitor your Pacemaker from home. This monitoring reduces the number of office visits required to check your device to one time per year. It allows [place] to monitor the functioning of your device to ensure it is working properly. You are scheduled for a device check from home on May 28, 2014.   You may send your transmission at any time that day. If you have a wireless device, the transmission will be sent automatically. After your physician reviews your transmission, you will receive a postcard with your next transmission date.   Dr. Sallyanne Kuster recommends that you schedule a follow-up appointment in: One Year.      Holli Humbles, MD, [place] [phone] office [phone] pager

## 2014-03-01 LAB — MDC_IDC_ENUM_SESS_TYPE_INCLINIC
Battery Remaining Longevity: 6
Brady Statistic RA Percent Paced: 74 %
Brady Statistic RV Percent Paced: 7 %
Implantable Pulse Generator Serial Number: 131216
Lead Channel Impedance Value: 395 Ohm
Lead Channel Impedance Value: 657 Ohm
Lead Channel Pacing Threshold Amplitude: 0.7 V
Lead Channel Pacing Threshold Amplitude: 0.9 V
Lead Channel Pacing Threshold Pulse Width: 0.4 ms
Lead Channel Sensing Intrinsic Amplitude: 3.4 mV
Lead Channel Sensing Intrinsic Amplitude: 4.6 mV
Lead Channel Setting Pacing Amplitude: 2.5 V
MDC IDC MSMT LEADCHNL RV PACING THRESHOLD PULSEWIDTH: 0.4 ms
MDC IDC SET LEADCHNL RA PACING AMPLITUDE: 2.6 V
MDC IDC SET LEADCHNL RV PACING PULSEWIDTH: 0.4 ms
MDC IDC SET LEADCHNL RV SENSING SENSITIVITY: 1 mV
Zone Setting Detection Interval: 316 ms

## 2014-05-24 ENCOUNTER — Telehealth: Payer: Self-pay | Admitting: Cardiology

## 2014-05-24 NOTE — Telephone Encounter (Signed)
Informed pt daughter in law to call tech services.

## 2014-05-24 NOTE — Telephone Encounter (Signed)
New Message  Pt daughter in law calling to make sure remote transmission was successfully sent. Please call back and discuss.

## 2014-05-24 NOTE — Telephone Encounter (Signed)
Per [name], pt has increased drop in energy. No cold symptoms. No temp. Pacer remote was received. Remote shows increase in PAF to 39%---recent interrogation on 1/11/6 was 5%. Current rhythm strip shows pt is out of rhythm--- afib or a-flutter.  [name] is aware I will send a msg to Dr. Lissa Merlin is aware, if pt's symptoms are solely due to AF/a-flutter, a visit to her PCP is not necessary.

## 2014-05-24 NOTE — Telephone Encounter (Signed)
Pt daughter in law called and stated that has not been feeling well, more tired / fatigued than ususal. Pt doesn't seem to be getting a cold. Informed pt daughter in law that it would be ok to send remote transmission. Pt daughter in law agreed w/ this plan.

## 2014-05-28 ENCOUNTER — Encounter: Payer: Self-pay | Admitting: Cardiovascular Disease

## 2014-05-28 ENCOUNTER — Ambulatory Visit (INDEPENDENT_AMBULATORY_CARE_PROVIDER_SITE_OTHER): Payer: Medicare Other | Admitting: *Deleted

## 2014-05-28 DIAGNOSIS — I480 Paroxysmal atrial fibrillation: Secondary | ICD-10-CM | POA: Diagnosis not present

## 2014-05-28 NOTE — Progress Notes (Signed)
Remote pacemaker transmission.   

## 2014-05-29 ENCOUNTER — Telehealth: Payer: Self-pay | Admitting: Cardiovascular Disease

## 2014-05-29 NOTE — Telephone Encounter (Signed)
Phone note sent in regards to transmission from last week. Jamie Daugherty was aware of reading from last week but concerned with increase in AF from January to now. Pt was scheduled and given appointment for 06-13-14 at 40 with Dr Loletha Grayer at [facility] office but daughter was unaware of appointment. Daughter to call and speak with scheduler about appointment.

## 2014-05-29 NOTE — Telephone Encounter (Signed)
Pt's daughter-in-law called in stating that a transmission was put through on 4/8 and she would like to know what the reading was. She also states that pt has been feeling fatigue and "woosy" and would like to be advised on what to do. Please f/u with her   Thanks

## 2014-06-03 ENCOUNTER — Telehealth: Payer: Self-pay | Admitting: Cardiovascular Disease

## 2014-06-03 NOTE — Telephone Encounter (Signed)
She has had a substantial increase in atrial fibrillation burden since last device check (37% versus 7-8% in past). I have yet to get access to the whole report, but should see it tomorrow. Specifically, would like to compare the exact dates that she feels worse with the dates AFib was ocurring. This could explain why she has more fatigue.

## 2014-06-03 NOTE — Telephone Encounter (Signed)
Pt is returning Nathan's call °

## 2014-06-03 NOTE — Telephone Encounter (Signed)
Daughter called, citing pt fatigue which has been noticeably pronounced over past 2 weeks. Pt has had less energy, spent 2 days in bed. No other obvious symptoms.  Advised this may be handled best by PCP - however, she requested Dr. Sallyanne Kuster review last pacemaker download. Informed I would pass along her request.  She would like instruction from Dr. Sallyanne Kuster on what to do.

## 2014-06-03 NOTE — Telephone Encounter (Signed)
Jamie Daugherty is calling in stating that within the past week the pt has been very weak and has little energy. Jamie Daugherty is very concerned and would like to know what to do. Please advise.  Thanks

## 2014-06-03 NOTE — Telephone Encounter (Signed)
Left message for patient to call.

## 2014-06-04 ENCOUNTER — Telehealth: Payer: Self-pay | Admitting: *Deleted

## 2014-06-04 LAB — MDC_IDC_ENUM_SESS_TYPE_REMOTE
Brady Statistic RA Percent Paced: 20 %
Implantable Pulse Generator Serial Number: 131216
Lead Channel Impedance Value: 436 Ohm
Lead Channel Impedance Value: 667 Ohm
MDC IDC MSMT LEADCHNL RA SENSING INTR AMPL: 3.7 mV
MDC IDC MSMT LEADCHNL RV SENSING INTR AMPL: 1.5 mV
MDC IDC STAT BRADY RV PERCENT PACED: 11 %

## 2014-06-04 NOTE — Telephone Encounter (Signed)
Called patient in an attempt to have her to send in a manual transmission to assess AF. Patient states that she was unable to this and does not even know if she has a monitor. She stated that I would need to talk to her daughter in law about her sx's and sending in a manual remote. She also stated that her daughter in law was in class and wouldn't be available until later this pm. I left a message with her son for him to call back.

## 2014-06-06 ENCOUNTER — Other Ambulatory Visit (HOSPITAL_COMMUNITY): Payer: Self-pay | Admitting: *Deleted

## 2014-06-06 ENCOUNTER — Ambulatory Visit (HOSPITAL_COMMUNITY)
Admission: RE | Admit: 2014-06-06 | Discharge: 2014-06-06 | Disposition: A | Discharge status: Home / Self Care | Payer: Medicare Other | Source: Ambulatory Visit | Attending: Nurse Practitioner | Admitting: Nurse Practitioner

## 2014-06-06 ENCOUNTER — Other Ambulatory Visit: Payer: Self-pay

## 2014-06-06 ENCOUNTER — Other Ambulatory Visit (HOSPITAL_COMMUNITY): Payer: Self-pay | Admitting: Nurse Practitioner

## 2014-06-06 ENCOUNTER — Encounter (HOSPITAL_COMMUNITY): Payer: Self-pay | Admitting: Nurse Practitioner

## 2014-06-06 VITALS — BP 118/72 | HR 115 | Ht 67.0 in | Wt 105.0 lb

## 2014-06-06 DIAGNOSIS — I481 Persistent atrial fibrillation: Secondary | ICD-10-CM | POA: Diagnosis present

## 2014-06-06 DIAGNOSIS — I4819 Other persistent atrial fibrillation: Secondary | ICD-10-CM

## 2014-06-06 LAB — COMPREHENSIVE METABOLIC PANEL
ALBUMIN: 3.8 g/dL (ref 3.5–5.2)
ALT: 20 U/L (ref 0–35)
ANION GAP: 11 (ref 5–15)
AST: 26 U/L (ref 0–37)
Alkaline Phosphatase: 58 U/L (ref 39–117)
BUN: 11 mg/dL (ref 6–23)
CHLORIDE: 100 mmol/L (ref 96–112)
CO2: 25 mmol/L (ref 19–32)
Calcium: 9 mg/dL (ref 8.4–10.5)
Creatinine, Ser: 0.88 mg/dL (ref 0.50–1.10)
GFR calc Af Amer: 64 mL/min — ABNORMAL LOW (ref >90)
GFR calc non Af Amer: 55 mL/min — ABNORMAL LOW (ref >90)
Glucose, Bld: 145 mg/dL — ABNORMAL HIGH (ref 70–99)
Potassium: 3.9 mmol/L (ref 3.5–5.1)
Sodium: 136 mmol/L (ref 135–145)
TOTAL PROTEIN: 6.9 g/dL (ref 6.0–8.3)
Total Bilirubin: 1.1 mg/dL (ref 0.3–1.2)

## 2014-06-06 LAB — CBC
HEMATOCRIT: 50 % — AB (ref 36.0–46.0)
HEMOGLOBIN: 16.6 g/dL — AB (ref 12.0–15.0)
MCH: 30.7 pg (ref 26.0–34.0)
MCHC: 33.2 g/dL (ref 30.0–36.0)
MCV: 92.4 fL (ref 78.0–100.0)
Platelets: 172 10*3/uL (ref 150–400)
RBC: 5.41 MIL/uL — ABNORMAL HIGH (ref 3.87–5.11)
RDW: 14.1 % (ref 11.5–15.5)
WBC: 6 10*3/uL (ref 4.0–10.5)

## 2014-06-06 MED ORDER — DILTIAZEM HCL ER COATED BEADS 120 MG PO TB24: 120.0000 mg | ORAL_TABLET | Freq: Every day | ORAL | Status: DC

## 2014-06-06 MED ORDER — ATENOLOL 50 MG PO TABS: 75.0000 mg | ORAL_TABLET | Freq: Two times a day (BID) | ORAL | Status: DC

## 2014-06-06 NOTE — Telephone Encounter (Signed)
Can this encounter be closed?

## 2014-06-06 NOTE — Progress Notes (Signed)
Patient ID: Jamie Daugherty, female   DOB: January 31, 1919, 81 y.o.   MRN: [id number]   Primary Care Physician: Stephens Shire, MD Referring Physician: Dr. Margy Clarks Busk is a 81 y.o. female with a h/o PAF, s/p dual chamber pacemaker in 08/2011, for tachy/brady syndrome. She has had recent c/o fatigue and exertional dyspnea over the last several weeks.. Interrogation of her device in 1/16, showed afib burden at 5%. But, interrogation of her device today reveals persistent afib since the beginning of March. She seems to tolerate at rest  and her daughter in law has noticed it takes much more effort for her to do her ADL's. She is pleasantly confused in the office but continues to live independently, with very close supervision of the daughter-in-law and the pt usually eats with her every pm meal.   Today, she denies symptoms of palpitations, chest pain,  orthopnea, PND, lower extremity edema, dizziness, presyncope, syncope, or neurologic sequela. Occasionally she will c/o of feeling woozy.  The patient is tolerating medications without difficulties and is otherwise without complaint today.   Past Medical History  Diagnosis Date  . Hypertension   . Atrial fibrillation   . Syncope   . Pacemaker - dual chamber [facility] implanted 2013 01/25/2013   Past Surgical History  Procedure Laterality Date  . Pacemaker insertion    . Abdominal hysterectomy      Current Outpatient Prescriptions  Medication Sig Dispense Refill  . Rivaroxaban (XARELTO) 15 MG TABS tablet Take 15 mg by mouth daily.    Marland Kitchen atenolol (TENORMIN) 50 MG tablet Take 1.5 tablets (75 mg total) by mouth 2 (two) times daily. 45 tablet 3  . diltiazem (CARDIZEM LA) 120 MG 24 hr tablet Take 1 tablet (120 mg total) by mouth daily. 30 tablet 3   No current facility-administered medications for this encounter.    Allergies  Allergen Reactions  . Penicillins Other (See Comments)    unknown    History   Social  History  . Marital Status: Unknown    Spouse Name: N/A  . Number of Children: N/A  . Years of Education: N/A   Occupational History  . Not on file.   Social History Main Topics  . Smoking status: Former Research scientist (life sciences)  . Smokeless tobacco: Not on file  . Alcohol Use: Yes     Comment: occ  . Drug Use: No  . Sexual Activity: Not on file   Other Topics Concern  . Not on file   Social History Narrative    Family History  Problem Relation Age of Onset  . Heart disease Mother   . Heart disease Father     ROS- All systems are reviewed and negative except as per the HPI above  Physical Exam: Filed Vitals:   06/06/14 1344  BP: 118/72  Pulse: 115  Height: 5\' 7"  (1.702 m)  Weight: 105 lb (47.628 kg)    GEN- The patient is well appearing, alert and oriented x 3 today, but pleasantly confused..   Head- normocephalic, atraumatic Eyes-  Sclera clear, conjunctiva pink Ears- hearing intact Oropharynx- clear Neck- supple, no JVP Lymph- no cervical lymphadenopathy Lungs- Clear to ausculation bilaterally, normal work of breathing Heart- Rapid, irregular rate and rhythm, no murmurs, rubs or gallops, PMI not laterally displaced GI- soft, NT, ND, + BS Extremities- no clubbing, cyanosis, or edema MS- no significant deformity or atrophy Skin- no rash or lesion Psych- euthymic mood, full affect Neuro- strength and  sensation are intact  EKG- Aflutter with variable AV block vrs afib with RVR, 115 bpm, ventricular paced. Device interrogation- Persistent afib since March 2016.  Rate response with mode switch while in afib turned on by device rep. See Paceart report   Assessment and Plan:  1. Symptomatic persistent  Afib- Long discussion with pt and daughter in law with options to make pt less symptomatic, including rate control/AAD/ DCCV. The daughter in law would like to start with most conservative management, rate control. Will stop valsartan to have more room with BP to add  additional rate control, since BP is around 118 sys today.  Add Cardizem CD 120 mg qd  Will keep current dose of atenolol but make it 75 mg bid to have better around the clock coverage. Continue xarelto for chadsvasc at least 5.  Labs today include CBC, CMET, TSH.  F/u on Monday for assessment BP/ HR/tolerance of above change.  F/u with Dr. Loletha Grayer as scheduled 4/28.

## 2014-06-06 NOTE — Telephone Encounter (Signed)
Daughter in law returned my call on 4/19. I asked her to send in a manual transmission. Manual transmission received. Presenting rhythm APVS, although increased AF burden noted since late Feb.    Spoke to daughter in law on 4/20 about pt sx's of weakness and fatigue. She states that this has now been going on for 3 weeks. I asked if she would like to have her mother seen in the office this week. She stated that she would like her seen this week. Appt made with Roderic Palau, NP in the AF clinic for 4/21 @ 1330. Corene Cornea w/[employer] aware of appt.

## 2014-06-06 NOTE — Patient Instructions (Signed)
Your physician has recommended you make the following change in your medication:  1)Stop Diovan 2)Change Atenolol to  1 1/2 tablets (75mg ) twice a day ----  A new prescription for Atenolol to a 50mg  tablet has been called in. 3)Add Cardizem 120mg  once a day.  Return on Monday for check - up.

## 2014-06-08 NOTE — Addendum Note (Signed)
Encounter addended by: Asencion Gowda, CCT on: 06/08/2014 11:56 AM<BR>     Documentation filed: Charges VN

## 2014-06-10 ENCOUNTER — Ambulatory Visit (HOSPITAL_COMMUNITY)
Admission: RE | Admit: 2014-06-10 | Discharge: 2014-06-10 | Disposition: A | Discharge status: Home / Self Care | Payer: Medicare Other | Source: Ambulatory Visit | Attending: Nurse Practitioner | Admitting: Nurse Practitioner

## 2014-06-10 VITALS — BP 100/70 | HR 91

## 2014-06-10 DIAGNOSIS — I481 Persistent atrial fibrillation: Secondary | ICD-10-CM | POA: Insufficient documentation

## 2014-06-10 DIAGNOSIS — I4819 Other persistent atrial fibrillation: Secondary | ICD-10-CM

## 2014-06-10 NOTE — Patient Instructions (Signed)
Follow up with Dr. Loletha Grayer as scheduled.  As needed for Afib clinic

## 2014-06-10 NOTE — Telephone Encounter (Signed)
Can this encounter be closed?

## 2014-06-11 NOTE — Progress Notes (Signed)
Patient ID: Jamie Daugherty, female   DOB: 10-16-1918, 81 y.o.   MRN: [id number]   Primary Care Physician: Stephens Shire, MD Referring Physician: Dr. Margy Clarks Jamie Daugherty is a 81 y.o. female with a h/o PAF, s/p dual chamber pacemaker in 08/2011, for tachy/brady syndrome. She has had recent c/o fatigue and exertional dyspnea over the last several weeks.. Interrogation of her device in 1/16, showed afib burden at 5%. But, interrogation of her device 4/21 reveals persistent afib since the beginning of March. She seems to tolerate at rest  and her daughter in law has noticed it takes more effort for her to do her ADL's. She is pleasantly confused in the office but continues to live independently, with very close supervision of the daughter-in-law and the pt usually eats with her every pm meal.   On last visit, after options were given including rate and rhythm control, the daughter in law wanted the most conservative approach, so rate control was chosen and cardizem was added and atenolol dose was made bid with ARB d/ced for fear of hypotension. Her heart rate was 115 and today is 91. The daughter in law states it took a couple of days for her to get use to the cardizem but today seems to have more energy. She will have more shortness of breath than a few months ago with walking long distances but with short distances is not symptomatic.  Today, she denies symptoms of palpitations, chest pain,  orthopnea, PND, lower extremity edema, dizziness, presyncope, syncope, or neurologic sequela. Occasionally she will c/o of feeling woozy.  The patient is tolerating medications without difficulties and is otherwise without complaint today.   Past Medical History  Diagnosis Date  . Hypertension   . Atrial fibrillation   . Syncope   . Pacemaker - dual chamber [facility] implanted 2013 01/25/2013   Past Surgical History  Procedure Laterality Date  . Pacemaker insertion    . Abdominal  hysterectomy      Current Outpatient Prescriptions  Medication Sig Dispense Refill  . atenolol (TENORMIN) 50 MG tablet Take 1.5 tablets (75 mg total) by mouth 2 (two) times daily. 45 tablet 3  . diltiazem (CARDIZEM LA) 120 MG 24 hr tablet Take 1 tablet (120 mg total) by mouth daily. 30 tablet 3  . Rivaroxaban (XARELTO) 15 MG TABS tablet Take 15 mg by mouth daily.     No current facility-administered medications for this encounter.    Allergies  Allergen Reactions  . Penicillins Other (See Comments)    unknown    History   Social History  . Marital Status: Unknown    Spouse Name: N/A  . Number of Children: N/A  . Years of Education: N/A   Occupational History  . Not on file.   Social History Main Topics  . Smoking status: Former Research scientist (life sciences)  . Smokeless tobacco: Not on file  . Alcohol Use: Yes     Comment: occ  . Drug Use: No  . Sexual Activity: Not on file   Other Topics Concern  . Not on file   Social History Narrative    Family History  Problem Relation Age of Onset  . Heart disease Mother   . Heart disease Father     ROS- All systems are reviewed and negative except as per the HPI above  Physical Exam: There were no vitals filed for this visit.  GEN- The patient is well appearing, alert and oriented x  3 today, but pleasantly confused..   Head- normocephalic, atraumatic Eyes-  Sclera clear, conjunctiva pink Ears- hearing intact Oropharynx- clear Neck- supple, no JVP Lymph- no cervical lymphadenopathy Lungs- Clear to ausculation bilaterally, normal work of breathing Heart-  irregular rate and rhythm, no murmurs, rubs or gallops, PMI not laterally displaced GI- soft, NT, ND, + BS Extremities- no clubbing, cyanosis, or edema MS- no significant deformity or atrophy Skin- no rash or lesion Psych- euthymic mood, full affect Neuro- strength and sensation are intact  EKG-afib at 91 bpm, ventricular paced. Device interrogation 4/21- Persistent afib since  March 2016.  Rate response with mode switch while in afib turned on by device rep. See [employer] report   Assessment and Plan:  1. Persistent  Afib- Continue cardizem/atenolol at current dose, additional rate control may cause symptomatic hypotension with soft BP of 431 systolic today. Continue xarelto for chadsvasc at least 5.   F/u with Dr. Sallyanne Kuster, afib clinic as needed.

## 2014-06-13 ENCOUNTER — Encounter: Payer: Self-pay | Admitting: Cardiovascular Disease

## 2014-06-13 ENCOUNTER — Ambulatory Visit (INDEPENDENT_AMBULATORY_CARE_PROVIDER_SITE_OTHER): Payer: Medicare Other | Admitting: Cardiovascular Disease

## 2014-06-13 VITALS — BP 112/62 | HR 88 | Ht 67.0 in | Wt 105.3 lb

## 2014-06-13 DIAGNOSIS — Z7901 Long term (current) use of anticoagulants: Secondary | ICD-10-CM | POA: Diagnosis not present

## 2014-06-13 DIAGNOSIS — I480 Paroxysmal atrial fibrillation: Secondary | ICD-10-CM | POA: Diagnosis not present

## 2014-06-13 DIAGNOSIS — Z950 Presence of cardiac pacemaker: Secondary | ICD-10-CM

## 2014-06-13 NOTE — Patient Instructions (Signed)
Dr. Sallyanne Kuster recommends that you schedule a follow-up appointment in: 3 months with pacemaker check (BS).

## 2014-06-13 NOTE — Progress Notes (Signed)
Patient ID: Jamie Daugherty, female   DOB: Aug 28, 1918, 81 y.o.   MRN: [id number]      Cardiology Office Note   Date:  06/13/2014   ID:  [name], DOB [birth year], MRN [id number]  PCP:  Stephens Shire, MD  Cardiologist:   Sanda Klein, MD   Chief Complaint  Patient presents with  . Follow-up    No complaints of chest pain or edema.  SOB with activity.  Occas. lightheadedness. Evaluated by Roderic Palau in the Atrial Fib clinic.  Energy is increasing.      History of Present Illness: Jamie Daugherty is a 81 y.o. female who presents for increased prevalence of atrial fibrillation and rapid ventricular response  She had syncope related to tachycardia bradycardia syndrome and received a dual-chamber permanent pacemaker in July 2013. She has had paroxysmal atrial fibrillation with rapid ventricular response. The arrhythmia was previously asymptomatic with a prevalence around 5%. She had 74% atrial pacing and only 7% ventricular pacing at her previous office check in early January.. All technical parameters of her [employer] dual-chamber pacemaker are within normal limits.  Over the last couple of months she has had deteriorating energy levels and poor stamina. Interrogation of her pacemaker showed a marked increase in the burden of atrial fibrillation and frequent rapid ventricular rates. She had been in persistent atrial fibrillation since the beginning of March. Generally asymptomatic at rest, she becomes easily fatigued with activity. She was seen in the atrial fibrillation clinic by Roderic Palau and diltiazem was added. Since then she feels better. Pacemaker interrogation shows that she has actually been in and out of atrial fibrillation, but the overall burden of arrhythmia remains high. The patient and her daughter prefer conservative management, preferably without antiarrhythmic medications and cardioversion.  Nonvalvular AF, normal LV function. CHADSVasc score is 4  (age 60, HTN 1, gender 1; no history of stroke/TIA, DM, CHF, vascular disease).  Past Medical History  Diagnosis Date  . Hypertension   . Atrial fibrillation   . Syncope   . Pacemaker - dual chamber [facility] implanted 2013 01/25/2013    Past Surgical History  Procedure Laterality Date  . Pacemaker insertion    . Abdominal hysterectomy       Current Outpatient Prescriptions  Medication Sig Dispense Refill  . atenolol (TENORMIN) 50 MG tablet Take 1.5 tablets (75 mg total) by mouth 2 (two) times daily. 45 tablet 3  . diltiazem (CARDIZEM LA) 120 MG 24 hr tablet Take 1 tablet (120 mg total) by mouth daily. 30 tablet 3  . Rivaroxaban (XARELTO) 15 MG TABS tablet Take 15 mg by mouth daily.     No current facility-administered medications for this visit.    Allergies:   Penicillins    Social History:  The patient  reports that she has quit smoking. She does not have any smokeless tobacco history on file. She reports that she drinks alcohol. She reports that she does not use illicit drugs.   Family History:  The patient's family history includes Heart disease in her father and mother.    ROS:  Please see the history of present illness.    Otherwise, review of systems positive for none.   All other systems are reviewed and negative.    PHYSICAL EXAM: VS:  BP 112/62 mmHg  Pulse 88  Ht 5\' 7"  (1.702 m)  Wt 105 lb 4.8 oz (47.764 kg)  BMI 16.49 kg/m2 , BMI Body mass index is 16.49 kg/(m^2).  General: Alert, oriented  x3, no distress, very slender Head: no evidence of trauma, PERRL, EOMI, no exophtalmos or lid lag, no myxedema, no xanthelasma; normal ears, nose and oropharynx Neck: normal jugular venous pulsations and no hepatojugular reflux; brisk carotid pulses without delay and no carotid bruits Chest: clear to auscultation, no signs of consolidation by percussion or palpation, normal fremitus, symmetrical and full respiratory excursions, healthy appearing pacemaker  subclavian site Cardiovascular: normal position and quality of the apical impulse, irregular rhythm, normal first and second heart sounds, no murmurs, rubs or gallops Abdomen: no tenderness or distention, no masses by palpation, no abnormal pulsatility or arterial bruits, normal bowel sounds, no hepatosplenomegaly Extremities: no clubbing, cyanosis or edema; 2+ radial, ulnar and brachial pulses bilaterally; 2+ right femoral, posterior tibial and dorsalis pedis pulses; 2+ left femoral, posterior tibial and dorsalis pedis pulses; no subclavian or femoral bruits Neurological: grossly nonfocal Psych: euthymic mood, full affect   EKG:  EKG is not ordered today.   Recent Labs: 06/06/2014: ALT 20; BUN 11; Creatinine 0.88; Hemoglobin 16.6*; Platelets 172; Potassium 3.9; Sodium 136    Lipid Panel No results found for: CHOL, TRIG, HDL, CHOLHDL, VLDL, LDLCALC, LDLDIRECT    Wt Readings from Last 3 Encounters:  06/13/14 105 lb 4.8 oz (47.764 kg)  02/25/14 107 lb 4.8 oz (48.671 kg)  07/17/13 110 lb 14.4 oz (50.304 kg)      Other studies Reviewed: Additional studies/ records that were reviewed today include: Notes from A. fib clinic and full pacemaker interrogation today   ASSESSMENT AND PLAN:  Jamie Daugherty developed fatigue and low energy levels secondary to protracted atrial fibrillation with rapid ventricular response. She was unaware of palpitations but this was recorded by her pacemaker. Electrical cardioversion would have no benefit since she spontaneously goes in and out of the arrhythmia. Simple rate control improvement had slept lead to improved symptoms. She is 81 years old and both the patient and her daughter would prefer conservative management without toxic antiarrhythmic medications and electrical cardioversion, as long as this ensures good quality of life. We are definitely in agreement on this and so far her clinical course has been favorable just with rate control. We cleared the  counters on her pacemaker today so that we will get a better idea of what her ventricular rate is at her next pacemaker check.   Current medicines are reviewed at length with the patient today.  The patient does not have concerns regarding medicines.  The following changes have been made:  no change  Labs/ tests ordered today include:  No orders of the defined types were placed in this encounter.    Patient Instructions  Dr. Sallyanne Kuster recommends that you schedule a follow-up appointment in: 3 months with pacemaker check (BS).     Mikael Spray, MD  06/13/2014 8:22 AM    Sanda Klein, MD, [facility] [phone] office [phone] pager

## 2014-06-14 ENCOUNTER — Other Ambulatory Visit (HOSPITAL_COMMUNITY): Payer: Self-pay | Admitting: *Deleted

## 2014-06-14 LAB — MDC_IDC_ENUM_SESS_TYPE_INCLINIC
Battery Remaining Longevity: 4.5
Brady Statistic RA Percent Paced: 16 %
Lead Channel Sensing Intrinsic Amplitude: 1.2 mV
Lead Channel Setting Pacing Pulse Width: 0.4 ms
MDC IDC MSMT LEADCHNL RA IMPEDANCE VALUE: 641 Ohm
MDC IDC MSMT LEADCHNL RV IMPEDANCE VALUE: 416 Ohm
MDC IDC MSMT LEADCHNL RV SENSING INTR AMPL: 2.8 mV
MDC IDC PG SERIAL: 131216
MDC IDC SET LEADCHNL RA PACING AMPLITUDE: 2.6 V
MDC IDC SET LEADCHNL RV PACING AMPLITUDE: 2.5 V
MDC IDC SET LEADCHNL RV SENSING SENSITIVITY: 1 mV
MDC IDC STAT BRADY RV PERCENT PACED: 15 %
Zone Setting Detection Interval: 316 ms

## 2014-06-14 MED ORDER — ATENOLOL 50 MG PO TABS: 75.0000 mg | ORAL_TABLET | Freq: Two times a day (BID) | ORAL | Status: DC

## 2014-06-18 ENCOUNTER — Other Ambulatory Visit: Payer: Self-pay | Admitting: Cardiovascular Disease

## 2014-09-06 ENCOUNTER — Encounter: Payer: Self-pay | Admitting: Cardiovascular Disease

## 2014-09-14 ENCOUNTER — Other Ambulatory Visit: Payer: Self-pay | Admitting: Cardiovascular Disease

## 2014-09-16 NOTE — Telephone Encounter (Signed)
REFILL 

## 2014-09-17 ENCOUNTER — Encounter: Payer: Self-pay | Admitting: Cardiovascular Disease

## 2014-09-17 ENCOUNTER — Ambulatory Visit (INDEPENDENT_AMBULATORY_CARE_PROVIDER_SITE_OTHER): Payer: Medicare Other | Admitting: Cardiovascular Disease

## 2014-09-17 VITALS — BP 102/72 | HR 96 | Resp 20 | Ht 67.0 in | Wt 101.0 lb

## 2014-09-17 DIAGNOSIS — I481 Persistent atrial fibrillation: Secondary | ICD-10-CM | POA: Diagnosis not present

## 2014-09-17 DIAGNOSIS — I4891 Unspecified atrial fibrillation: Secondary | ICD-10-CM

## 2014-09-17 DIAGNOSIS — I4819 Other persistent atrial fibrillation: Secondary | ICD-10-CM | POA: Insufficient documentation

## 2014-09-17 LAB — CUP PACEART INCLINIC DEVICE CHECK
Battery Remaining Longevity: 4
Brady Statistic RA Percent Paced: 1 % — CL
Brady Statistic RV Percent Paced: 49 %
Date Time Interrogation Session: 20160816150145
Lead Channel Pacing Threshold Amplitude: 0.7 V
Lead Channel Pacing Threshold Pulse Width: 0.4 ms
Lead Channel Sensing Intrinsic Amplitude: 3.9 mV
Lead Channel Setting Pacing Amplitude: 2.5 V
Lead Channel Setting Pacing Pulse Width: 0.4 ms
Lead Channel Setting Sensing Sensitivity: 1 mV
MDC IDC MSMT LEADCHNL RA IMPEDANCE VALUE: 680 Ohm
MDC IDC MSMT LEADCHNL RA SENSING INTR AMPL: 5.9 mV
MDC IDC MSMT LEADCHNL RV IMPEDANCE VALUE: 425 Ohm
MDC IDC PG SERIAL: 131216
MDC IDC SET LEADCHNL RA PACING AMPLITUDE: 2.6 V
Zone Setting Detection Interval: 316 ms

## 2014-09-17 NOTE — Patient Instructions (Signed)
Your physician wants you to follow-up in: 6 Months. You will receive a reminder letter in the mail two months in advance. If you don't receive a letter, please call our office to schedule the follow-up appointment.  Remote monitoring is used to monitor your Pacemaker of ICD from home. This monitoring reduces the number of office visits required to check your device to one time per year. It allows [place] to keep an eye on the functioning of your device to ensure it is working properly. You are scheduled for a device check from home on 12/17/2014. You may send your transmission at any time that day. If you have a wireless device, the transmission will be sent automatically. After your physician reviews your transmission, you will receive a postcard with your next transmission date.  Your physician has requested that you have an echocardiogram. Echocardiography is a painless test that uses sound waves to create images of your heart. It provides your doctor with information about the size and shape of your heart and how well your heart's chambers and valves are working. This procedure takes approximately one hour. There are no restrictions for this procedure.

## 2014-09-17 NOTE — Progress Notes (Signed)
Patient ID: Jamie Daugherty, female   DOB: [birth year], 81 y.o.   MRN: [id number]      Cardiology Office Note   Date:  09/17/2014   ID:  Jamie Daugherty, DOB February 11, 1919, MRN [id number]  PCP:  Stephens Shire, MD  Cardiologist:   Sanda Klein, MD   Chief Complaint  Patient presents with  . Follow-up  . Shortness of Breath      History of Present Illness: Jamie Daugherty is a 81 y.o. female who presents for pacemaker check and atrial fibrillation followup.  Since her last visit in April she has been in a long episode of persistent trial fibrillation. Although rate control has improved since the last appointment, roughly 20-25% of the time her ventricular rate is >100 bpm. Her BP is in the low normal range. She has had a single fall, probably a trip over a threshold. She has bilateral forearm ecchymoses/hematomas. She has not had dizziness or syncope. V pacing has increased from 15 to 50%.  As before, she wants conservative management and wants to avoid cardioversion, toxic antiarrhythmics and , in general, escalation of her care.  Her stamina and energy level remains inferior to last year. She denies dyspnea and edema. She still goed grocery shopping with her daughter.  Nonvalvular AF, normal LV function (last checked 2014). CHADSVasc score is 4 (age 52, HTN 1, gender 1; no history of stroke/TIA, DM, CHF, vascular disease).  Past Medical History  Diagnosis Date  . Hypertension   . Atrial fibrillation   . Syncope   . Pacemaker - dual chamber [facility] implanted 2013 01/25/2013    Past Surgical History  Procedure Laterality Date  . Pacemaker insertion    . Abdominal hysterectomy       Current Outpatient Prescriptions  Medication Sig Dispense Refill  . atenolol (TENORMIN) 50 MG tablet Take 1.5 tablets (75 mg total) by mouth 2 (two) times daily. 135 tablet 3  . diltiazem (CARDIZEM LA) 120 MG 24 hr tablet Take 1 tablet (120 mg total) by mouth daily. 30 tablet 3  .  Rivaroxaban (XARELTO) 15 MG TABS tablet Take 15 mg by mouth daily.     No current facility-administered medications for this visit.    Allergies:   Penicillins    Social History:  The patient  reports that she has quit smoking. She does not have any smokeless tobacco history on file. She reports that she drinks alcohol. She reports that she does not use illicit drugs.   Family History:  The patient's family history includes Heart disease in her father and mother.    ROS:  Please see the history of present illness.    Otherwise, review of systems positive for none.   All other systems are reviewed and negative.    PHYSICAL EXAM: VS:  BP 102/72 mmHg  Pulse 96  Resp 20  Ht 5\' 7"  (1.702 m)  Wt 101 lb (45.813 kg)  BMI 15.82 kg/m2 , BMI Body mass index is 15.82 kg/(m^2).  General: Alert, oriented x3, no distress Head: no evidence of trauma, PERRL, EOMI, no exophtalmos or lid lag, no myxedema, no xanthelasma; normal ears, nose and oropharynx Neck: normal jugular venous pulsations and no hepatojugular reflux; brisk carotid pulses without delay and no carotid bruits Chest: clear to auscultation, no signs of consolidation by percussion or palpation, normal fremitus, symmetrical and full respiratory excursions Cardiovascular: normal position and quality of the apical impulse, irregular rhythm, normal first and second heart sounds, no murmurs, rubs or gallops  Abdomen: no tenderness or distention, no masses by palpation, no abnormal pulsatility or arterial bruits, normal bowel sounds, no hepatosplenomegaly Extremities: no clubbing, cyanosis or edema; 2+ radial, ulnar and brachial pulses bilaterally; 2+ right femoral, posterior tibial and dorsalis pedis pulses; 2+ left femoral, posterior tibial and dorsalis pedis pulses; no subclavian or femoral bruits Neurological: grossly nonfocal Psych: euthymic mood, full affect   EKG:  EKG is not ordered today. Recent Labs: 06/06/2014: ALT 20; BUN 11;  Creatinine, Ser 0.88; Hemoglobin 16.6*; Platelets 172; Potassium 3.9; Sodium 136      Wt Readings from Last 3 Encounters:  09/17/14 101 lb (45.813 kg)  06/13/14 105 lb 4.8 oz (47.764 kg)  06/06/14 105 lb (47.628 kg)    ASSESSMENT AND PLAN:  (Long-term) persistent atrial fibrillation. Rate control is barely adequate, even by the lax criteria of the AFFIRM trial. However, BP precludes additional conventional AV blocking agents. Remaining option include digoxin or amiodarone, both risky in a nonagenarian. Will recheck LVEF. If normal, will continue current rate control regimen. If there is any hint of cardiomyopathy, I incline to start amiodarone. Similar concern rests on the risk/benefit balance of anticoagulation after her recent fall. This was her only fall and thankfully, not a cause of serious injury. Will keep on anticoagulation for now, but the net benefit of anticoagulation will need to be periodically reviewed.  Normal pacemaker function. Roughly 50% V pacing    Current medicines are reviewed at length with the patient today.  The patient has concerns regarding medicines.  The following changes have been made:  no change  Labs/ tests ordered today include:   Orders Placed This Encounter  Procedures  . ECHOCARDIOGRAM COMPLETE    Patient Instructions  Your physician wants you to follow-up in: 6 Months. You will receive a reminder letter in the mail two months in advance. If you don't receive a letter, please call our office to schedule the follow-up appointment.  Remote monitoring is used to monitor your Pacemaker of ICD from home. This monitoring reduces the number of office visits required to check your device to one time per year. It allows [place] to keep an eye on the functioning of your device to ensure it is working properly. You are scheduled for a device check from home on 12/17/2014. You may send your transmission at any time that day. If you have a wireless device, the  transmission will be sent automatically. After your physician reviews your transmission, you will receive a postcard with your next transmission date.  Your physician has requested that you have an echocardiogram. Echocardiography is a painless test that uses sound waves to create images of your heart. It provides your doctor with information about the size and shape of your heart and how well your heart's chambers and valves are working. This procedure takes approximately one hour. There are no restrictions for this procedure.           Mikael Spray, MD  09/17/2014 9:03 PM    Sanda Klein, MD, [facility] [phone] office [phone] pager

## 2014-09-19 ENCOUNTER — Other Ambulatory Visit: Payer: Self-pay

## 2014-09-19 ENCOUNTER — Ambulatory Visit (HOSPITAL_COMMUNITY): Payer: Medicare Other | Attending: Cardiovascular Disease

## 2014-09-19 ENCOUNTER — Other Ambulatory Visit: Payer: Self-pay | Admitting: Cardiovascular Disease

## 2014-09-19 DIAGNOSIS — Z8249 Family history of ischemic heart disease and other diseases of the circulatory system: Secondary | ICD-10-CM | POA: Diagnosis not present

## 2014-09-19 DIAGNOSIS — Z87891 Personal history of nicotine dependence: Secondary | ICD-10-CM | POA: Diagnosis not present

## 2014-09-19 DIAGNOSIS — I4891 Unspecified atrial fibrillation: Secondary | ICD-10-CM | POA: Diagnosis present

## 2014-09-19 DIAGNOSIS — I517 Cardiomegaly: Secondary | ICD-10-CM | POA: Diagnosis not present

## 2014-09-19 DIAGNOSIS — I340 Nonrheumatic mitral (valve) insufficiency: Secondary | ICD-10-CM | POA: Insufficient documentation

## 2014-09-19 DIAGNOSIS — I10 Essential (primary) hypertension: Secondary | ICD-10-CM | POA: Diagnosis not present

## 2014-09-19 MED ORDER — AMIODARONE HCL 200 MG PO TABS: 200.0000 mg | ORAL_TABLET | Freq: Every day | ORAL | Status: DC

## 2014-09-19 MED ORDER — AMIODARONE HCL 200 MG PO TABS: 400.0000 mg | ORAL_TABLET | Freq: Every day | ORAL | Status: DC

## 2014-09-19 NOTE — Progress Notes (Signed)
Patient ID: Jamie Daugherty, female   DOB: 12-22-1918, 81 y.o.   MRN: [id number]   2D Echocardiogram Complete.  09/19/2014   Deliah Boston, RDCS   Preliminary Technician Findings:  Severely Decreased Ejection Fraction in Comparison to echo done 10-02-12.  Results reported to Dr. Sallyanne Kuster.

## 2014-09-20 ENCOUNTER — Telehealth: Payer: Self-pay | Admitting: Cardiovascular Disease

## 2014-09-20 NOTE — Telephone Encounter (Signed)
Closed encounter °

## 2014-09-24 ENCOUNTER — Other Ambulatory Visit (HOSPITAL_COMMUNITY): Payer: Self-pay | Admitting: Cardiovascular Disease

## 2014-09-24 ENCOUNTER — Telehealth: Payer: Self-pay | Admitting: Cardiovascular Disease

## 2014-09-24 NOTE — Telephone Encounter (Signed)
Correct. Valsartan no longer needed.

## 2014-09-24 NOTE — Telephone Encounter (Signed)
Rx(s) sent to pharmacy electronically.  

## 2014-09-24 NOTE — Telephone Encounter (Signed)
Received a call from pharmacist Robin at [facility].She stated she was calling she received a e prescription for valsartan 40 mg.Son stated mother is no longer on valsartan.She was calling to confirm.Advised at 09/17/14 office visit with Dr.Croitoru patient is taking cardizem 120 mg daily.No valsartan.

## 2014-10-14 ENCOUNTER — Telehealth: Payer: Self-pay | Admitting: Cardiovascular Disease

## 2014-10-14 NOTE — Telephone Encounter (Signed)
Returned call to patient's daughter n law Debbie.She stated for the past 2 days patient has had a nose bleed.Stated she just started on amiodarone and wanted to know if amiodarone would cause.Advised amiodarone should not cause nose bleed.Stated she has been taking xarelto 15 mg daily for the past 2 years and has never had a problem.Dr.Croitoru out of office today will send him message for advice.

## 2014-10-14 NOTE — Telephone Encounter (Signed)
Jamie Daugherty states Jamie Daugherty was started on Amiodarone the 1st of August.  For the past 2 days she has had a nose bleed.  Could this be related?

## 2014-10-15 ENCOUNTER — Encounter (HOSPITAL_COMMUNITY): Payer: Self-pay | Admitting: *Deleted

## 2014-10-15 ENCOUNTER — Inpatient Hospital Stay (HOSPITAL_COMMUNITY)
Admission: EM | Admit: 2014-10-15 | Discharge: 2014-10-23 | DRG: 811 | Disposition: A | Discharge status: Skilled Nursing Facility | Payer: Medicare Other | Attending: Internal Medicine | Admitting: Internal Medicine

## 2014-10-15 DIAGNOSIS — D696 Thrombocytopenia, unspecified: Secondary | ICD-10-CM | POA: Diagnosis present

## 2014-10-15 DIAGNOSIS — Z681 Body mass index (BMI) 19 or less, adult: Secondary | ICD-10-CM

## 2014-10-15 DIAGNOSIS — T45515A Adverse effect of anticoagulants, initial encounter: Secondary | ICD-10-CM | POA: Diagnosis present

## 2014-10-15 DIAGNOSIS — N390 Urinary tract infection, site not specified: Secondary | ICD-10-CM | POA: Diagnosis not present

## 2014-10-15 DIAGNOSIS — S41111A Laceration without foreign body of right upper arm, initial encounter: Secondary | ICD-10-CM | POA: Diagnosis present

## 2014-10-15 DIAGNOSIS — F0390 Unspecified dementia without behavioral disturbance: Secondary | ICD-10-CM | POA: Diagnosis present

## 2014-10-15 DIAGNOSIS — E876 Hypokalemia: Secondary | ICD-10-CM | POA: Diagnosis present

## 2014-10-15 DIAGNOSIS — D72829 Elevated white blood cell count, unspecified: Secondary | ICD-10-CM | POA: Diagnosis present

## 2014-10-15 DIAGNOSIS — Z7901 Long term (current) use of anticoagulants: Secondary | ICD-10-CM | POA: Diagnosis not present

## 2014-10-15 DIAGNOSIS — I4819 Other persistent atrial fibrillation: Secondary | ICD-10-CM | POA: Diagnosis present

## 2014-10-15 DIAGNOSIS — R040 Epistaxis: Secondary | ICD-10-CM | POA: Insufficient documentation

## 2014-10-15 DIAGNOSIS — K921 Melena: Secondary | ICD-10-CM | POA: Diagnosis present

## 2014-10-15 DIAGNOSIS — Z880 Allergy status to penicillin: Secondary | ICD-10-CM

## 2014-10-15 DIAGNOSIS — E871 Hypo-osmolality and hyponatremia: Secondary | ICD-10-CM | POA: Diagnosis present

## 2014-10-15 DIAGNOSIS — Z8249 Family history of ischemic heart disease and other diseases of the circulatory system: Secondary | ICD-10-CM

## 2014-10-15 DIAGNOSIS — D62 Acute posthemorrhagic anemia: Principal | ICD-10-CM | POA: Diagnosis present

## 2014-10-15 DIAGNOSIS — R55 Syncope and collapse: Secondary | ICD-10-CM

## 2014-10-15 DIAGNOSIS — Z87891 Personal history of nicotine dependence: Secondary | ICD-10-CM

## 2014-10-15 DIAGNOSIS — Z950 Presence of cardiac pacemaker: Secondary | ICD-10-CM

## 2014-10-15 DIAGNOSIS — K920 Hematemesis: Secondary | ICD-10-CM | POA: Diagnosis present

## 2014-10-15 DIAGNOSIS — I481 Persistent atrial fibrillation: Secondary | ICD-10-CM | POA: Diagnosis present

## 2014-10-15 DIAGNOSIS — X58XXXA Exposure to other specified factors, initial encounter: Secondary | ICD-10-CM | POA: Diagnosis present

## 2014-10-15 DIAGNOSIS — E43 Unspecified severe protein-calorie malnutrition: Secondary | ICD-10-CM | POA: Diagnosis present

## 2014-10-15 DIAGNOSIS — D649 Anemia, unspecified: Secondary | ICD-10-CM | POA: Diagnosis present

## 2014-10-15 DIAGNOSIS — I10 Essential (primary) hypertension: Secondary | ICD-10-CM | POA: Diagnosis present

## 2014-10-15 DIAGNOSIS — Z79899 Other long term (current) drug therapy: Secondary | ICD-10-CM

## 2014-10-15 LAB — I-STAT CHEM 8, ED
BUN: 29 mg/dL — AB (ref 6–20)
CREATININE: 0.8 mg/dL (ref 0.44–1.00)
Calcium, Ion: 1.05 mmol/L — ABNORMAL LOW (ref 1.13–1.30)
Chloride: 97 mmol/L — ABNORMAL LOW (ref 101–111)
Glucose, Bld: 128 mg/dL — ABNORMAL HIGH (ref 65–99)
HEMATOCRIT: 37 % (ref 36.0–46.0)
Hemoglobin: 12.6 g/dL (ref 12.0–15.0)
Potassium: 4.1 mmol/L (ref 3.5–5.1)
Sodium: 132 mmol/L — ABNORMAL LOW (ref 135–145)
TCO2: 25 mmol/L (ref 0–100)

## 2014-10-15 LAB — PROTIME-INR
INR: 3.31 — ABNORMAL HIGH (ref 0.00–1.49)
Prothrombin Time: 33 seconds — ABNORMAL HIGH (ref 11.6–15.2)

## 2014-10-15 LAB — BASIC METABOLIC PANEL
Anion gap: 9 (ref 5–15)
BUN: 30 mg/dL — AB (ref 6–20)
CHLORIDE: 99 mmol/L — AB (ref 101–111)
CO2: 26 mmol/L (ref 22–32)
Calcium: 8.2 mg/dL — ABNORMAL LOW (ref 8.9–10.3)
Creatinine, Ser: 0.84 mg/dL (ref 0.44–1.00)
GFR calc Af Amer: >60 mL/min (ref >60)
GFR calc non Af Amer: 58 mL/min — ABNORMAL LOW (ref >60)
GLUCOSE: 128 mg/dL — AB (ref 65–99)
Potassium: 4.2 mmol/L (ref 3.5–5.1)
Sodium: 134 mmol/L — ABNORMAL LOW (ref 135–145)

## 2014-10-15 LAB — CBC
HCT: 36.7 % (ref 36.0–46.0)
HEMOGLOBIN: 12.1 g/dL (ref 12.0–15.0)
MCH: 30.3 pg (ref 26.0–34.0)
MCHC: 33 g/dL (ref 30.0–36.0)
MCV: 91.8 fL (ref 78.0–100.0)
Platelets: 169 10*3/uL (ref 150–400)
RBC: 4 MIL/uL (ref 3.87–5.11)
RDW: 14.3 % (ref 11.5–15.5)
WBC: 9.4 10*3/uL (ref 4.0–10.5)

## 2014-10-15 LAB — HEMOGLOBIN AND HEMATOCRIT, BLOOD
HCT: 36 % (ref 36.0–46.0)
HEMATOCRIT: 32.1 % — AB (ref 36.0–46.0)
HEMOGLOBIN: 11.8 g/dL — AB (ref 12.0–15.0)
HEMOGLOBIN: 10.9 g/dL — AB (ref 12.0–15.0)

## 2014-10-15 LAB — ABO/RH: ABO/RH(D): A POS

## 2014-10-15 MED ORDER — ONDANSETRON HCL 4 MG/2ML IJ SOLN
4.0000 mg | Freq: Four times a day (QID) | INTRAMUSCULAR | Status: DC | PRN
Start: 2014-10-15 — End: 2014-10-23

## 2014-10-15 MED ORDER — AMIODARONE HCL 200 MG PO TABS
400.0000 mg | ORAL_TABLET | Freq: Every day | ORAL | Status: DC
  Administered 2014-10-16 – 2014-10-23 (×8): 400 mg via ORAL
  Filled 2014-10-15 (×8): qty 2

## 2014-10-15 MED ORDER — LIDOCAINE-EPINEPHRINE (PF) 2 %-1:200000 IJ SOLN
20.0000 mL | Freq: Once | INTRAMUSCULAR | Status: AC
  Administered 2014-10-15: 20 mL
  Filled 2014-10-15: qty 20

## 2014-10-15 MED ORDER — SODIUM CHLORIDE 0.9 % IJ SOLN
3.0000 mL | Freq: Two times a day (BID) | INTRAMUSCULAR | Status: DC
  Administered 2014-10-15 – 2014-10-23 (×9): 3 mL via INTRAVENOUS

## 2014-10-15 MED ORDER — OXYMETAZOLINE HCL 0.05 % NA SOLN
1.0000 | Freq: Once | NASAL | Status: AC
  Filled 2014-10-15: qty 15

## 2014-10-15 MED ORDER — DILTIAZEM HCL ER COATED BEADS 120 MG PO TB24
120.0000 mg | ORAL_TABLET | Freq: Every day | ORAL | Status: DC
  Administered 2014-10-16 – 2014-10-19 (×4): 120 mg via ORAL
  Filled 2014-10-15 (×9): qty 1

## 2014-10-15 MED ORDER — ACETAMINOPHEN 325 MG PO TABS: 650.0000 mg | ORAL_TABLET | Freq: Four times a day (QID) | ORAL | Status: DC | PRN

## 2014-10-15 MED ORDER — ACETAMINOPHEN 650 MG RE SUPP: 650.0000 mg | Freq: Four times a day (QID) | RECTAL | Status: DC | PRN

## 2014-10-15 MED ORDER — ONDANSETRON HCL 4 MG PO TABS
4.0000 mg | ORAL_TABLET | Freq: Four times a day (QID) | ORAL | Status: DC | PRN
  Administered 2014-10-20: 4 mg via ORAL
  Filled 2014-10-15: qty 1

## 2014-10-15 MED ORDER — ATENOLOL 50 MG PO TABS
75.0000 mg | ORAL_TABLET | Freq: Two times a day (BID) | ORAL | Status: DC
  Administered 2014-10-15 – 2014-10-23 (×16): 75 mg via ORAL
  Filled 2014-10-15 (×32): qty 1

## 2014-10-15 MED ORDER — SILVER NITRATE-POT NITRATE 75-25 % EX MISC
1.0000 | Freq: Once | CUTANEOUS | Status: AC
  Administered 2014-10-15: 1 via TOPICAL
  Filled 2014-10-15: qty 1

## 2014-10-15 MED ORDER — BACITRACIN ZINC 500 UNIT/GM EX OINT
TOPICAL_OINTMENT | Freq: Two times a day (BID) | CUTANEOUS | Status: DC
  Administered 2014-10-15: 1 via TOPICAL
  Administered 2014-10-16: 10:00:00 via TOPICAL
  Administered 2014-10-16 – 2014-10-17 (×3): 1 via TOPICAL
  Administered 2014-10-18: 10:00:00 via TOPICAL
  Administered 2014-10-18 – 2014-10-23 (×10): 1 via TOPICAL
  Filled 2014-10-15 (×10): qty 0.9
  Filled 2014-10-15: qty 1.8
  Filled 2014-10-15 (×3): qty 0.9

## 2014-10-15 MED ORDER — SODIUM CHLORIDE 0.45 % IV SOLN
INTRAVENOUS | Status: DC
  Administered 2014-10-15 – 2014-10-16 (×2): via INTRAVENOUS

## 2014-10-15 MED ORDER — CEPHALEXIN 500 MG PO CAPS
500.0000 mg | ORAL_CAPSULE | Freq: Once | ORAL | Status: AC
  Administered 2014-10-15: 500 mg via ORAL
  Filled 2014-10-15: qty 1

## 2014-10-15 MED ORDER — INFLUENZA VAC SPLIT QUAD 0.5 ML IM SUSY
0.5000 mL | PREFILLED_SYRINGE | INTRAMUSCULAR | Status: DC
  Filled 2014-10-15 (×2): qty 0.5

## 2014-10-15 NOTE — ED Notes (Signed)
Spoke to [employer] and he sts that the Pt's pacemaker "looks great."  Admitting MD made aware.  Faxed report will be sent w/ Pt to Inpatient room.

## 2014-10-15 NOTE — ED Notes (Signed)
Pt's family member out to nurse's station, appearing frantic requesting "help, something isn't right." Pt found to be clammy, hot to touch, not responding to verbal stimuli, with emesis bag under mouth and what appeared to be bloody emesis with small clots in mouth and on chin. Pt suctioned and quickly returned to baseline after RN entered room but family reports pt had been "unresponsive" for "a couple of minutes after saying she didn't feel good, her stomach gurgled and she threw up then stopped talking to [place]." Mingo Amber, MD at bedside to assess.

## 2014-10-15 NOTE — ED Notes (Signed)
It is noted that there was a large amount of bloody stool in commode once pt was lifted to stretcher.

## 2014-10-15 NOTE — Telephone Encounter (Signed)
Spoke to [employer]. [name] states patient went  ER earlier today , patient's nose started bleeding again today.  Debbie states patient 's nose was packed- -  RN informed daughter to hold XARELTO  for one day.she verbalized understanding. Will hold 10/16/14.

## 2014-10-15 NOTE — ED Provider Notes (Signed)
I was asked by Dr. Ralene Bathe to repair laceration on the patient's right arm.  LACERATION REPAIR Performed by: Montine Circle Authorized by: Montine Circle Consent: Verbal consent obtained. Risks and benefits: risks, benefits and alternatives were discussed Consent given by: patient Patient identity confirmed: provided demographic data Prepped and Draped in normal sterile fashion Wound explored  Laceration Location:stellate laceration of right forearm  Laceration Length: 5cm  No Foreign Bodies seen or palpated  Anesthesia: local infiltration  Local anesthetic: lidocaine 2% with epinephrine  Anesthetic total: 5 ml  Irrigation method: syringe Amount of cleaning: standard  Skin closure: 4-0 prolene  Number of sutures: 7  Technique: horizontal matress  Patient tolerance: Patient tolerated the procedure well with no immediate complications.   Bacitracin applied.  Wound dressed.  Sutures out in 10-14 days.  Montine Circle, PA-C 10/15/14 1800  Quintella Reichert, MD 10/15/14 2236

## 2014-10-15 NOTE — ED Notes (Signed)
Pt ambulated to bathroom with RN. Pt with minimal assistance, mild unsteady on feet. Pt left in restroom for <1 minute for RN to return to pt's room to clean stretcher that was soiled. Upon arrival back to bathroom, pt has found leaned back on commode, unresponsive to verbal cues. Other staff called to bathroom. Questionably weak pulse felt however d/t pt's unresponsiveness staff RN returned pt to stretcher in hall STAT. Pt placed back on monitor and moved to another room. MD at bedside to assess pt.

## 2014-10-15 NOTE — ED Notes (Signed)
Pt in from home where she lives with family. C/o nosebleed x3 today. Uncontrolled upon arrival, but slowed. Has spit out clots of blood with ems. No feeling nauseous at this point but sts she has swallowed a lot of blood over the course of the bleeds. On xarelto for Hx Afib. Scheduled to see ENT today. Received 2 sprays Afrin from ems without improvement. Manual pressure being held on bridge of nose currently.

## 2014-10-15 NOTE — ED Provider Notes (Signed)
CSN: [id number]     Arrival date & time 10/15/14  1321 History   First MD Initiated Contact with Patient 10/15/14 1329     Chief Complaint  Patient presents with  . Epistaxis     (Consider location/radiation/quality/duration/timing/severity/associated sxs/prior Treatment) Patient is a 81 y.o. female presenting with nosebleeds. The history is provided by the patient.  Epistaxis Location:  L nare Timing:  Intermittent Progression:  Unchanged Chronicity:  New Context: not home oxygen and not recent infection   Relieved by:  Nothing Worsened by:  Nothing tried Associated symptoms: no cough and no fever     Past Medical History  Diagnosis Date  . Hypertension   . Atrial fibrillation   . Syncope   . Pacemaker - dual chamber [facility] implanted 2013 01/25/2013   Past Surgical History  Procedure Laterality Date  . Pacemaker insertion    . Abdominal hysterectomy     Family History  Problem Relation Age of Onset  . Heart disease Mother   . Heart disease Father    Social History  Substance Use Topics  . Smoking status: Former Research scientist (life sciences)  . Smokeless tobacco: None  . Alcohol Use: Yes     Comment: occ   OB History    No data available     Review of Systems  Constitutional: Negative for fever.  HENT: Positive for nosebleeds.   Respiratory: Negative for cough and shortness of breath.   Gastrointestinal: Negative for vomiting and abdominal pain.  All other systems reviewed and are negative.     Allergies  Penicillins  Home Medications   Prior to Admission medications   Medication Sig Start Date End Date Taking? Authorizing Provider  amiodarone (PACERONE) 200 MG tablet Take 2 tablets (400 mg total) by mouth daily. 09/19/14 10/20/14 Yes Mihai Croitoru, MD  atenolol (TENORMIN) 50 MG tablet Take 1.5 tablets (75 mg total) by mouth 2 (two) times daily. 06/14/14  Yes Sherran Needs, NP  CARDIZEM LA 120 MG 24 hr tablet TAKE 1 TABLET BY MOUTH EVERY DAY 09/24/14  Yes Mihai  Croitoru, MD  Rivaroxaban (XARELTO) 15 MG TABS tablet Take 15 mg by mouth daily. 06/21/13  Yes Mihai Croitoru, MD  amiodarone (PACERONE) 200 MG tablet Take 1 tablet (200 mg total) by mouth daily. Patient not taking: Reported on 10/15/2014 10/21/14   Mihai Croitoru, MD   BP 138/71 mmHg  Pulse 70  Temp(Src) 98.5 F (36.9 C) (Oral)  Resp 18  SpO2 96% Physical Exam  Constitutional: She appears well-developed and well-nourished. No distress.  HENT:  Head: Normocephalic and atraumatic.  Nose: Epistaxis (L, small spot anteriorly without active bleeding) is observed. Left sinus exhibits no maxillary sinus tenderness and no frontal sinus tenderness.  Mouth/Throat: Oropharynx is clear and moist.  Eyes: EOM are normal. Pupils are equal, round, and reactive to light.  Neck: Normal range of motion. Neck supple.  Cardiovascular: Normal rate and regular rhythm.  Exam reveals no friction rub.   No murmur heard. Pulmonary/Chest: Effort normal and breath sounds normal. No respiratory distress. She has no wheezes. She has no rales.  Abdominal: Soft. She exhibits no distension. There is no tenderness. There is no rebound.  Musculoskeletal: Normal range of motion. She exhibits no edema.  Neurological: She is alert.  Skin: She is not diaphoretic.  Nursing note and vitals reviewed.   ED Course  EPISTAXIS MANAGEMENT Date/Time: 10/15/2014 3:56 PM Performed by: Evelina Bucy Authorized by: Evelina Bucy Consent: Verbal consent obtained. Patient sedated: no  Treatment site: left anterior and left posterior Repair method: silver nitrate and merocel sponge Post-procedure assessment: bleeding stopped Treatment complexity: complex Recurrence: recurrence of recent bleed Patient tolerance: Patient tolerated the procedure well with no immediate complications   (including critical care time) Labs Review Labs Reviewed  CBC  BASIC METABOLIC PANEL  PROTIME-INR  I-STAT CHEM 8, ED    Imaging Review No results  found. I have personally reviewed and evaluated these images and lab results as part of my medical decision-making.   EKG Interpretation None      MDM   Final diagnoses:  Epistaxis  Syncope, unspecified syncope type    55F here with nosebleed. Multiple nosebleeds over the past few days, all resolved with pressure. Today has an appointment with ENT, however had persistent bleeding and came to the ED. Patient here with bleeding controlled. L nare with possible small spot of bleeding, silver nitrate applied.  While awaiting a recheck, patient felt like something wasn't right, vomiting blood, and passed over for a few minutes. On my exam, she was responsive, feeling ok. She had a large amount of blood emesis. I think this hematemesis was likely from her nosebleed, not from a gastric process.  Patient reported she still felt a trickle of blood down the back of her throat prior to the vomiting per family, which leads me to believe she has a small posterior bleed. 10 cm merocel pack placed on the L. Keflex given.  With her syncope, vomiting, I feel she should be observed. Dr. Benjamine Mola with ENT is in agreement and will consult on the patient.  Dr. Ralene Bathe will f/u on labs.    Evelina Bucy, MD 10/15/14 [phone]

## 2014-10-15 NOTE — H&P (Addendum)
[facility] History and Physical  Jamie Daugherty TFT:[id number] DOB: 07/24/1918 DOA: 10/15/2014  Referring physician: dR. Star Age PCP: Jamie Shire, MD   Chief Complaint: Nose bleed  HPI: Jamie Daugherty is a 81 y.o. female   Low 5 caveat: Patient presenting with dementia and unable to give any reliable history. History provided by ED physician and patient's son.  Patient states she is unsure why she is here today. Per patient's son she developed a nosebleed on Friday. This would occur intermittently over the course of the weekend. He easily resolved with pinching of the nose and patient tipping her head backwards. When patient's no started to bleed this morning family use the above tactics without success. Patient began complaining of dizziness and the son called EMS to bring patient to [place] along ED. The patient had an outpatient appointment set with ENT, Dr. Redmond Daugherty, later this week. Patient was started on Xarelto for A. fib 3 years ago. Patient typically very stable on her feet.  Per ED report, patient had a single bout of bloody emesis for which she then became unresponsive for a few seconds. Patient had another syncopal episode after using the bedside commode and passing a large amount of bloody stool. Stool described as dark and mostly blood.  Patient also sustained a right arm laceration while in the emergency room during her syncopal episode. No head trauma.  Review of Systems:    Past Medical History  Diagnosis Date  . Hypertension   . Atrial fibrillation   . Syncope   . Pacemaker - dual chamber [facility] implanted 2013 01/25/2013   Past Surgical History  Procedure Laterality Date  . Pacemaker insertion    . Abdominal hysterectomy     Social History:  reports that she has quit smoking. She does not have any smokeless tobacco history on file. She reports that she drinks alcohol. She reports that she does not use illicit drugs.  Allergies  Allergen  Reactions  . Penicillins Other (See Comments)    unknown    Family History  Problem Relation Age of Onset  . Heart disease Mother   . Heart disease Father      Prior to Admission medications   Medication Sig Start Date End Date Taking? Authorizing Provider  amiodarone (PACERONE) 200 MG tablet Take 2 tablets (400 mg total) by mouth daily. 09/19/14 10/20/14 Yes Mihai Croitoru, MD  atenolol (TENORMIN) 50 MG tablet Take 1.5 tablets (75 mg total) by mouth 2 (two) times daily. 06/14/14  Yes Sherran Needs, NP  CARDIZEM LA 120 MG 24 hr tablet TAKE 1 TABLET BY MOUTH EVERY DAY 09/24/14  Yes Mihai Croitoru, MD  Rivaroxaban (XARELTO) 15 MG TABS tablet Take 15 mg by mouth daily. 06/21/13  Yes Mihai Croitoru, MD  amiodarone (PACERONE) 200 MG tablet Take 1 tablet (200 mg total) by mouth daily. Patient not taking: Reported on 10/15/2014 10/21/14   Sanda Klein, MD   Physical Exam: Filed Vitals:   10/15/14 1331 10/15/14 1656 10/15/14 1826 10/15/14 1903  BP: 138/71 114/55 130/94 130/66  Pulse: 70 70 70 71  Temp: 98.5 F (36.9 C)   97.9 F (36.6 C)  TempSrc: Oral   Oral  Resp: 18 16 20 18   Weight:    45.36 kg (100 lb)  SpO2: 96% 97% 93% 100%    Wt Readings from Last 3 Encounters:  10/15/14 45.36 kg (100 lb)  09/17/14 45.813 kg (101 lb)  06/13/14 47.764 kg (105 lb 4.8 oz)  General: elderly and frail-appearing.  Eyes:  PERRL, normal lids, irises & conjunctiva ENT: left Merrell with [employer] in place and bloody. Blood clearly visualized dripping down pharynx.  Neck:  no LAD, masses or thyromegaly Cardiovascular:  RRR, no m/r/g. Trace LE edema Respiratory:  CTA bilaterally, no w/r/r. Normal respiratory effort. Abdomen:  soft, ntnd Skin: right forearm with bandages in place that are clean dry and intact. No other rash appreciated.  Musculoskeletal:  grossly normal tone BUE/BLE Psychiatric:  AOx1. Attempts to answer questions appropriately but patient has no recollection of recent events.    Neurologic:   cranial nerves grossly intact, moves all extremity coordinated fashion,   .          Labs on Admission:  Basic Metabolic Panel:  Recent Labs Lab 10/15/14 1553 10/15/14 1558  NA 134* 132*  K 4.2 4.1  CL 99* 97*  CO2 26  --   GLUCOSE 128* 128*  BUN 30* 29*  CREATININE 0.84 0.80  CALCIUM 8.2*  --    Liver Function Tests: No results for input(s): AST, ALT, ALKPHOS, BILITOT, PROT, ALBUMIN in the last 168 hours. No results for input(s): LIPASE, AMYLASE in the last 168 hours. No results for input(s): AMMONIA in the last 168 hours. CBC:  Recent Labs Lab 10/15/14 1553 10/15/14 1558 10/15/14 1708  WBC 9.4  --   --   HGB 12.1 12.6 11.8*  HCT 36.7 37.0 36.0  MCV 91.8  --   --   PLT 169  --   --    Cardiac Enzymes: No results for input(s): CKTOTAL, CKMB, CKMBINDEX, TROPONINI in the last 168 hours.  BNP (last 3 results) No results for input(s): BNP in the last 8760 hours.  ProBNP (last 3 results) No results for input(s): PROBNP in the last 8760 hours.  CBG: No results for input(s): GLUCAP in the last 168 hours.  Radiological Exams on Admission: No results found.   Assessment/Plan Active Problems:   Chronic anticoagulation, on Xarelto   Pacemaker - dual chamber [employer] implanted 2013   Persistent atrial fibrillation   Posterior epistaxis   Syncope   Dementia without behavioral disturbance   Laceration of right upper arm   Persistent Epistaxis: Patient with initially intermittent but now constant epistaxis. Attempted chemical cautery and the anterior nasal passage was attempted without success. Pt w/ large volume bloody emesis and hematochezia x1 in ED. unlikely that bloody BM from GI bleed, but may warrant further workup if continues after resolution of epistaxis. ENT (Dr Benjamine Mola) consulted by ED. Currently Hgb stable at 12. - Tele - Hgb Q6 - f/u ENT recs - Stop Xarelto - continue current Rhinorocket   Syncope: likely vasovagal given  the fact that episodes came immediately after violent emesis and large bloody BM. Pacemaker interrogated in ED and no events noted. No evidence of stroke, or szr.  - tele - fall precautions  Afib: Chronic. On Xarelto. Currently rate controlled.  - contineu Dilt, amiodarone, and atenolol - hold xarelto as above (discussed likely discontinuation of this medication) - EKG  Dementia: at baseline per son. No medications - monitor  R arm laceration: sustained and repaired in ED - wound cared   Code Status: FULL DVT Prophylaxis: SCD Family Communication: son Disposition Plan: PEnding improvemetn  MERRELL, DAVID J, MD Family Medicine [facility] [email or web address] Password [email or web address]

## 2014-10-15 NOTE — ED Notes (Signed)
17:20 pt can go to floor.Marland Kitchenklj

## 2014-10-15 NOTE — ED Notes (Signed)
Interrogation performed on Jamie Daugherty return call from company with interrogation results. Pt at baseline. Resting in bed, on monitor. HR 70 V-paced. Family at bedside.

## 2014-10-15 NOTE — ED Notes (Addendum)
Pt was in restroom with RN. Pt became unresponsive on the toilet. Pt assisted to floor, no falls. Initially could not feel a pulse. Pt awakened spontaneously. Skin tear on R forearm caused by staff attempting to assist pt to floor. Blood found in toilet

## 2014-10-15 NOTE — Telephone Encounter (Signed)
No amiodarone should not cause nose bleed and does not interact with Xarelto. OK to hold Xarelto for one day if bleeding will not stop.

## 2014-10-15 NOTE — ED Notes (Signed)
MD at bedside. 

## 2014-10-15 NOTE — ED Notes (Signed)
Bed: QZ00 Expected date:  Expected time:  Means of arrival:  Comments: EMS- 81yo F, nosebleed on blood thinners

## 2014-10-16 DIAGNOSIS — S41111A Laceration without foreign body of right upper arm, initial encounter: Secondary | ICD-10-CM

## 2014-10-16 DIAGNOSIS — E43 Unspecified severe protein-calorie malnutrition: Secondary | ICD-10-CM | POA: Insufficient documentation

## 2014-10-16 DIAGNOSIS — R040 Epistaxis: Secondary | ICD-10-CM | POA: Diagnosis not present

## 2014-10-16 LAB — HEMOGLOBIN AND HEMATOCRIT, BLOOD
HCT: 30.4 % — ABNORMAL LOW (ref 36.0–46.0)
HCT: 28.8 % — ABNORMAL LOW (ref 36.0–46.0)
HEMOGLOBIN: 9.9 g/dL — AB (ref 12.0–15.0)
Hemoglobin: 10.3 g/dL — ABNORMAL LOW (ref 12.0–15.0)

## 2014-10-16 LAB — CBC
HCT: 29 % — ABNORMAL LOW (ref 36.0–46.0)
Hemoglobin: 9.8 g/dL — ABNORMAL LOW (ref 12.0–15.0)
MCH: 30.6 pg (ref 26.0–34.0)
MCHC: 33.8 g/dL (ref 30.0–36.0)
MCV: 90.6 fL (ref 78.0–100.0)
PLATELETS: 153 10*3/uL (ref 150–400)
RBC: 3.2 MIL/uL — ABNORMAL LOW (ref 3.87–5.11)
RDW: 14.2 % (ref 11.5–15.5)
WBC: 10.4 10*3/uL (ref 4.0–10.5)

## 2014-10-16 LAB — PROTIME-INR
INR: 1.27 (ref 0.00–1.49)
PROTHROMBIN TIME: 16.1 s — AB (ref 11.6–15.2)

## 2014-10-16 MED ORDER — CEPHALEXIN 500 MG PO CAPS
500.0000 mg | ORAL_CAPSULE | Freq: Four times a day (QID) | ORAL | Status: DC
  Administered 2014-10-16 – 2014-10-20 (×16): 500 mg via ORAL
  Filled 2014-10-16 (×16): qty 1

## 2014-10-16 MED ORDER — "THROMBI-PAD 3""X3"" EX PADS"
1.0000 | MEDICATED_PAD | Freq: Once | CUTANEOUS | Status: AC
  Administered 2014-10-16: 1 via TOPICAL
  Filled 2014-10-16: qty 1

## 2014-10-16 MED ORDER — "THROMBI-PAD 3""X3"" EX PADS"
1.0000 | MEDICATED_PAD | Freq: Once | CUTANEOUS | Status: AC
  Filled 2014-10-16: qty 1

## 2014-10-16 MED ORDER — BOOST / RESOURCE BREEZE PO LIQD
1.0000 | Freq: Two times a day (BID) | ORAL | Status: DC
  Administered 2014-10-16 – 2014-10-17 (×2): 1 via ORAL

## 2014-10-16 NOTE — Consult Note (Signed)
Reason for Consult: Epistaxis  HPI:  Jamie Daugherty is an 81 y.o. female who was admitted yesterday after an episode of severe left epistaxis with syncope. Pt was admitted for observation. The bleeding was controlled with a merocel packing. Pt had a history of recurrent bleeding in the past, but none this severe. Pt is currently on Xarelto. No recent nasal trauma.  Past Medical History  Diagnosis Date  . Hypertension   . Atrial fibrillation   . Syncope   . Pacemaker - dual chamber [facility] implanted 2013 01/25/2013    Past Surgical History  Procedure Laterality Date  . Pacemaker insertion    . Abdominal hysterectomy      Family History  Problem Relation Age of Onset  . Heart disease Mother   . Heart disease Father     Social History:  reports that she has quit smoking. She does not have any smokeless tobacco history on file. She reports that she drinks alcohol. She reports that she does not use illicit drugs.  Allergies:  Allergies  Allergen Reactions  . Penicillins Other (See Comments)    unknown    Prior to Admission medications   Medication Sig Start Date End Date Taking? Authorizing Provider  amiodarone (PACERONE) 200 MG tablet Take 2 tablets (400 mg total) by mouth daily. 09/19/14 10/20/14 Yes Mihai Croitoru, MD  atenolol (TENORMIN) 50 MG tablet Take 1.5 tablets (75 mg total) by mouth 2 (two) times daily. 06/14/14  Yes Sherran Needs, NP  CARDIZEM LA 120 MG 24 hr tablet TAKE 1 TABLET BY MOUTH EVERY DAY 09/24/14  Yes Mihai Croitoru, MD  Rivaroxaban (XARELTO) 15 MG TABS tablet Take 15 mg by mouth daily. 06/21/13  Yes Mihai Croitoru, MD  amiodarone (PACERONE) 200 MG tablet Take 1 tablet (200 mg total) by mouth daily. Patient not taking: Reported on 10/15/2014 10/21/14   Sanda Klein, MD    Results for orders placed or performed during the hospital encounter of 10/15/14 (from the past 48 hour(s))  CBC     Status: None   Collection Time: 10/15/14  3:53 PM  Result Value  Ref Range   WBC 9.4 4.0 - 10.5 K/uL   RBC 4.00 3.87 - 5.11 MIL/uL   Hemoglobin 12.1 12.0 - 15.0 g/dL   HCT 36.7 36.0 - 46.0 %   MCV 91.8 78.0 - 100.0 fL   MCH 30.3 26.0 - 34.0 pg   MCHC 33.0 30.0 - 36.0 g/dL   RDW 14.3 11.5 - 15.5 %   Platelets 169 150 - 400 K/uL  Basic metabolic panel     Status: Abnormal   Collection Time: 10/15/14  3:53 PM  Result Value Ref Range   Sodium 134 (L) 135 - 145 mmol/L   Potassium 4.2 3.5 - 5.1 mmol/L   Chloride 99 (L) 101 - 111 mmol/L   CO2 26 22 - 32 mmol/L   Glucose, Bld 128 (H) 65 - 99 mg/dL   BUN 30 (H) 6 - 20 mg/dL   Creatinine, Ser 0.84 0.44 - 1.00 mg/dL   Calcium 8.2 (L) 8.9 - 10.3 mg/dL   GFR calc non Af Amer 58 (L) >60 mL/min   GFR calc Af Amer >60 >60 mL/min    Comment: (NOTE) The eGFR has been calculated using the CKD EPI equation. This calculation has not been validated in all clinical situations. eGFR's persistently <60 mL/min signify possible Chronic Kidney Disease.    Anion gap 9 5 - 15  Protime-INR  Status: Abnormal   Collection Time: 10/15/14  3:53 PM  Result Value Ref Range   Prothrombin Time 33.0 (H) 11.6 - 15.2 seconds   INR 3.31 (H) 0.00 - 1.49  I-stat chem 8, ed     Status: Abnormal   Collection Time: 10/15/14  3:58 PM  Result Value Ref Range   Sodium 132 (L) 135 - 145 mmol/L   Potassium 4.1 3.5 - 5.1 mmol/L   Chloride 97 (L) 101 - 111 mmol/L   BUN 29 (H) 6 - 20 mg/dL   Creatinine, Ser 0.80 0.44 - 1.00 mg/dL   Glucose, Bld 128 (H) 65 - 99 mg/dL   Calcium, Ion 1.05 (L) 1.13 - 1.30 mmol/L   TCO2 25 0 - 100 mmol/L   Hemoglobin 12.6 12.0 - 15.0 g/dL   HCT 37.0 36.0 - 46.0 %  Type and screen     Status: None   Collection Time: 10/15/14  5:08 PM  Result Value Ref Range   ABO/RH(D) A POS    Antibody Screen NEG    Sample Expiration 10/18/2014   Hemoglobin and hematocrit, blood     Status: Abnormal   Collection Time: 10/15/14  5:08 PM  Result Value Ref Range   Hemoglobin 11.8 (L) 12.0 - 15.0 g/dL   HCT 36.0 36.0  - 46.0 %  ABO/Rh     Status: None   Collection Time: 10/15/14  6:00 PM  Result Value Ref Range   ABO/RH(D) A POS   Hemoglobin and hematocrit, blood     Status: Abnormal   Collection Time: 10/15/14 11:25 PM  Result Value Ref Range   Hemoglobin 10.9 (L) 12.0 - 15.0 g/dL   HCT 32.1 (L) 36.0 - 46.0 %    No results found.  Review of Systems  Constitutional: Negative for fever.  HENT: Positive for nosebleeds.  Respiratory: Negative for cough and shortness of breath.  Gastrointestinal: Negative for vomiting and abdominal pain.  All other systems reviewed and are negative.  Blood pressure 130/66, pulse 71, temperature 97.9 F (36.6 C), temperature source Oral, resp. rate 18, height 5' 6"  (1.676 m), weight 45.36 kg (100 lb), SpO2 100 %.  Physical Exam  Constitutional: She appears well-developed and well-nourished. No distress. Head: Normocephalic and atraumatic.  Nose: Left merocel packing in place. No active bleeding.  Mouth/Throat: Oropharynx is clear and moist. No pharyngeal blood. Ears: Normal auricles and EACs. Eyes: EOM are normal. Pupils are equal, round, and reactive to light.  Neck: Normal range of motion. Neck supple.  Cardiovascular: Normal rate and regular rhythm.  Pulmonary/Chest: Effort normal and breath sounds normal. No respiratory distress. She has no wheezes. She has no rales.  Neurological: She is alert.  Skin: She is not diaphoretic.  Nursing note and vitals reviewed.  Assessment/Plan: Left posterior epistaxis, now controlled with merocel packing. Will leave packing in place. Pt may follow up in my office next Monday for packing removal. Will need gram+ abx coverage while packing is in place (e.g. Keflex). Pt may call my office at [phone].  Michelyn Scullin,SUI W 10/16/2014, 2:08 AM

## 2014-10-16 NOTE — Progress Notes (Addendum)
Patient ID: Jamie Daugherty, female   DOB: 03-27-1918, 81 y.o.   MRN: [id number]  [facility] PROGRESS NOTE  Teshia Mahone GEX:[id number] DOB: [birth year] DOA: 10/15/2014 PCP: Stephens Shire, MD   Brief narrative:    81 y.o. female with known dementia, atrial fibrillation on Xarelto for 3 years, presented to [facility] emergency department with several days duration of persistent nosebleed. Please note that patient was unable to provide any history due to dementia, son at bedside able to provide some details and explained that patient has been complaining of dizziness at home and son called EMS. In emergency department, patient had syncopal event trying to use bedside commode, large amount of bloody stool was noted by staff. Patient sustained right arm laceration post syncopal event and had noticeable amount of bleeding. Per son's report, patient had an appointment scheduled with Dr. Redmond Baseman ENT for evaluation of epistaxis.  Assessment/Plan:    Active Problems:   Acute blood loss anemia - Bleeding from right arm post laceration, epistaxis, blood in stool - Likely secondary to use Xarelto - Hemoglobin trend since admission: 11.8 --> 9.9 - Repeat CBC this afternoon, transfuse if hemoglobin less than 9 - Continue to hold Xarelto - Discussed with son at bedside to completely stop anticoagulation due to high risk of bleed, he agrees with discontinuation per minute - We'll repeat CBC again in the morning - Patient does not want to have colonoscopy or endoscopy done, son at bedside agrees, conservative management only    Chronic anticoagulation, on Xarelto - Stopped as noted above, son agrees with permanently discontinuing anticoagulation    Syncopal event - Secondary to acute blood loss anemia in the setting of Xarelto use - Orthostatics positive, secondary to bleeding - Transfuse as indicated as noted above - Up with assistance only due to high risk of fall    Pacemaker - dual  chamber [employer] implanted 2013    Persistent atrial fibrillation, CHADS2 = 5 - Will need to be off anticoagulation as outlined above - Rate controlled with Cardizem and atenolol per home medical regimen - continue amiodarone     Posterior epistaxis - Packing in place, per ENT doctor keep in place and place on Keflex with packing in place     Dementia without behavioral disturbance - Will need PT evaluation once more medically stable    Hyponatremia - from acute bleeding - monitor BMP    Laceration of right upper arm - thrombi pad in place    Underweight - nutritionist consulted - Body mass index is 16.15  DVT prophylaxis - SCD's  Code Status: Full.  Family Communication:  plan of care discussed with the patient and son at bedside  Disposition Plan: Home when stable.   IV access:  Peripheral IV  Procedures and diagnostic studies:    No results found.  Medical Consultants:  None  Other Consultants:  Nutritionist   IAnti-Infectives:   Keflex 8/30 --> for as long as she has packing in place   Faye Ramsay, MD  [facility] Pager [phone]  If 7PM-7AM, please contact night-coverage [email or web address] Password [email or web address] 10/16/2014, 11:08 AM  HPI/Subjective: No events overnight. Still bleeding from right arm laceration.   Objective: Filed Vitals:   10/15/14 1903 10/16/14 0330 10/16/14 0618 10/16/14 0923  BP: 130/66 130/93  138/69  Pulse: 71 70  74  Temp: 97.9 F (36.6 C)  98.4 F (36.9 C) 97.7 F (36.5 C)  TempSrc: Oral  Oral Oral  Resp: 18 16  16  Height: 5\' 6"  (1.676 m)     Weight: 45.36 kg (100 lb)     SpO2: 100% 98%  98%    Intake/Output Summary (Last 24 hours) at 10/16/14 1108 Last data filed at 10/15/14 1451  Gross per 24 hour  Intake      0 ml  Output    300 ml  Net   -300 ml    Exam:   General:  Pt is alert, not in acute distress  Cardiovascular: Regular rate and rhythm, no rubs, no gallops  Respiratory: Clear to auscultation  bilaterally, no wheezing, no crackles, no rhonchi  Abdomen: Soft, non tender, non distended, bowel sounds present, no guarding  Extremities: right arm laceration with bleeding  Data Reviewed: Basic Metabolic Panel:  Recent Labs Lab 10/15/14 1553 10/15/14 1558  NA 134* 132*  K 4.2 4.1  CL 99* 97*  CO2 26  --   GLUCOSE 128* 128*  BUN 30* 29*  CREATININE 0.84 0.80  CALCIUM 8.2*  --    CBC:  Recent Labs Lab 10/15/14 1553 10/15/14 1558 10/15/14 1708 10/15/14 2325 10/16/14 0422 10/16/14 0956  WBC 9.4  --   --   --   --   --   HGB 12.1 12.6 11.8* 10.9* 10.3* 9.9*  HCT 36.7 37.0 36.0 32.1* 30.4* 28.8*  MCV 91.8  --   --   --   --   --   PLT 169  --   --   --   --   --    Scheduled Meds: . amiodarone  400 mg Oral Daily  . atenolol  75 mg Oral BID  . bacitracin   Topical BID  . cephALEXin  500 mg Oral 4 times per day  . diltiazem  120 mg Oral Daily  . Influenza vac split quadrivalent PF  0.5 mL Intramuscular Tomorrow-1000  . sodium chloride  3 mL Intravenous Q12H   Continuous Infusions: . sodium chloride 50 mL/hr at 10/15/14 2048

## 2014-10-16 NOTE — Progress Notes (Signed)
Pt only voided once this shift, this am, bladder scan at 1700 showed 382 mls, messaged Dr. Doyle Askew. Awaiting response.  10/16/2014 Rosine Beat

## 2014-10-16 NOTE — Progress Notes (Signed)
RN called to patient's room by tech. Patient appeared to have had a syncopal episode where she was briefly unresponsive. RRT called. Upon their arrival to floor the patient was alert and talking. VSS. Heart V-paced on the monitor. Patient stated that she has "always been a fainter." H&H changed to 4 am with draws Q6H to follow. Patient with epitaxis and rectal bleeding. Xarelto being held.

## 2014-10-16 NOTE — Progress Notes (Signed)
Initial Nutrition Assessment  DOCUMENTATION CODES:   Severe malnutrition in context of chronic illness, Underweight  INTERVENTION:  - Advance diet as medically feasible - Will order Boost Breeze BID, each supplement provides 250 kcal and 9 grams of protein - RD will continue to monitor for needs  NUTRITION DIAGNOSIS:   Malnutrition related to chronic illness as evidenced by severe depletion of muscle mass, severe depletion of body fat.  GOAL:   Patient will meet greater than or equal to 90% of their needs  MONITOR:   Diet advancement, Supplement acceptance, Weight trends, Labs, I & O's  REASON FOR ASSESSMENT:   Consult Assessment of nutrition requirement/status  ASSESSMENT:   Patient states she is unsure why she is here today. Per patient's son she developed a nosebleed on Friday. This would occur intermittently over the course of the weekend. He easily resolved with pinching of the nose and patient tipping her head backwards. When patient's no started to bleed this morning family use the above tacticsPatient states she is unsure why she is here today. Per patient's son she developed a nosebleed on Friday. This would occur intermittently over the course of the weekend. He easily resolved with pinching of the nose and patient tipping her head backwards. When patient's no started to bleed this morning family use the above tactics without success. Patient began complaining of dizziness and the son called EMS to bring patient to [place] [facility] ED. The patient had an outpatient appointment set with ENT, Dr. Redmond Baseman, later this week. Patient was started on Xarelto for A. fib 3 years ago. Patient typically very stable on her feet. without success. Patient began complaining of dizziness and the son called EMS to bring patient to [place] [facility] ED. The patient had an outpatient appointment set with ENT, Dr. Redmond Baseman, later this week. Patient was started on Xarelto for A. fib 3 years ago. Patient typically very  stable on her feet.  Pt seen for consult. BMI indicates underweight status. 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  Pt denies abdominal pain or nausea at this time. She is unable to recall the last time she had something to eat but feels that she typically has a good appetite at home. She denies chewing or swallowing difficulties. She states that her weight has been stable recently.   Per weight hx review, pt has lost 5 lbs (5% body weight) in the past 4 months which is not significant for time frame. Severe muscle and fat wasting noted.  Will order Boost Breeze with CLD diet and change supplement with diet advancement. Medications reviewed. Labs reviewed; Na: 132 mmol/L, Cl: 97 mmol/L, BUN elevated, ionized Ca: 1.05 mmol/L.      Diet Order:  Diet clear liquid Room service appropriate?: Yes; Fluid consistency:: Thin  Skin:  Wound (see comment) (R arm wound)  Last BM:  8/30  Height:   Ht Readings from Last 1 Encounters:  10/15/14 5\' 6"  (1.676 m)    Weight:   Wt Readings from Last 1 Encounters:  10/15/14 100 lb (45.36 kg)    Ideal Body Weight:  59.09 kg (kg)  BMI:  Body mass index is 16.15 kg/(m^2).  Estimated Nutritional Needs:   Kcal:  1308-6578  Protein:  50-60 grams  Fluid:  1.7-2 L/day  EDUCATION NEEDS:   No education needs identified at this time     Jarome Matin, RD, LDN Inpatient Clinical Dietitian Pager #  [phone] After hours/weekend pager # [phone]

## 2014-10-17 DIAGNOSIS — S41111A Laceration without foreign body of right upper arm, initial encounter: Secondary | ICD-10-CM | POA: Diagnosis not present

## 2014-10-17 DIAGNOSIS — R040 Epistaxis: Secondary | ICD-10-CM | POA: Diagnosis not present

## 2014-10-17 LAB — BASIC METABOLIC PANEL
ANION GAP: 6 (ref 5–15)
BUN: 20 mg/dL (ref 6–20)
CHLORIDE: 97 mmol/L — AB (ref 101–111)
CO2: 25 mmol/L (ref 22–32)
Calcium: 7.7 mg/dL — ABNORMAL LOW (ref 8.9–10.3)
Creatinine, Ser: 0.52 mg/dL (ref 0.44–1.00)
Glucose, Bld: 100 mg/dL — ABNORMAL HIGH (ref 65–99)
POTASSIUM: 2.9 mmol/L — AB (ref 3.5–5.1)
SODIUM: 128 mmol/L — AB (ref 135–145)

## 2014-10-17 LAB — CBC
HEMATOCRIT: 23.3 % — AB (ref 36.0–46.0)
HEMOGLOBIN: 7.9 g/dL — AB (ref 12.0–15.0)
MCH: 30.6 pg (ref 26.0–34.0)
MCHC: 33.9 g/dL (ref 30.0–36.0)
MCV: 90.3 fL (ref 78.0–100.0)
Platelets: 138 10*3/uL — ABNORMAL LOW (ref 150–400)
RBC: 2.58 MIL/uL — AB (ref 3.87–5.11)
RDW: 14 % (ref 11.5–15.5)
WBC: 9.8 10*3/uL (ref 4.0–10.5)

## 2014-10-17 LAB — PREPARE RBC (CROSSMATCH)

## 2014-10-17 MED ORDER — ENSURE ENLIVE PO LIQD
237.0000 mL | Freq: Two times a day (BID) | ORAL | Status: DC
  Administered 2014-10-17 – 2014-10-20 (×5): 237 mL via ORAL

## 2014-10-17 MED ORDER — POTASSIUM CHLORIDE CRYS ER 20 MEQ PO TBCR
40.0000 meq | EXTENDED_RELEASE_TABLET | Freq: Two times a day (BID) | ORAL | Status: AC
  Administered 2014-10-17 (×2): 40 meq via ORAL
  Filled 2014-10-17 (×2): qty 2

## 2014-10-17 MED ORDER — SODIUM CHLORIDE 0.9 % IV SOLN
Freq: Once | INTRAVENOUS | Status: AC
  Administered 2014-10-17: 10:00:00 via INTRAVENOUS

## 2014-10-17 NOTE — Progress Notes (Signed)
NUTRITION NOTE  New consult for assessment of nutrition status and requirements received. Pt seen for full assessment yesterday (8/31).  Diet was advanced from CLD to Regular at 1054 this AM and pt has not yet received a tray on this diet order. She consumed 25% of CLD lunch yesterday and no documentation of dinner last night or breakfast this AM.  Will change supplement: d/c Boost Breeze and order Ensure Enlive BID, each supplement provides 350 kcal and 20 grams of protein   RD will continue to follow per protocol.    Jarome Matin, RD, LDN Inpatient Clinical Dietitian Pager # [phone] After hours/weekend pager # [phone]

## 2014-10-17 NOTE — Progress Notes (Signed)
Patient ID: Jamie Daugherty, female   DOB: February 29, 1918, 81 y.o.   MRN: [id number]  [facility] PROGRESS NOTE  Jeremie Abdelaziz JME:[id number] DOB: [birth year] DOA: 10/15/2014 PCP: Stephens Shire, MD   Brief narrative:    81 y.o. female with known dementia, atrial fibrillation on Xarelto for 3 years, presented to [facility] emergency department with several days duration of persistent nosebleed. Please note that patient was unable to provide any history due to dementia, son at bedside able to provide some details and explained that patient has been complaining of dizziness at home and son called EMS. In emergency department, patient had syncopal event trying to use bedside commode, large amount of bloody stool was noted by staff. Patient sustained right arm laceration post syncopal event and had noticeable amount of bleeding. Per son's report, patient had an appointment scheduled with Dr. Redmond Baseman ENT for evaluation of epistaxis.  Assessment/Plan:    Active Problems:   Acute blood loss anemia - Bleeding from right arm post laceration, epistaxis, blood in stool - Likely secondary to use Xarelto  - Hemoglobin trend since admission: 11.8 --> 9.9 --> 7.9 - transfuse two units PRBC 9/1 - Continue to hold Xarelto - Discussed with son at bedside to completely stop anticoagulation due to high risk of bleed, he agrees with discontinuation per minute - We'll repeat CBC again in the morning - Patient does not want to have colonoscopy or endoscopy done, son at bedside agrees, conservative management only    Chronic anticoagulation, on Xarelto - Stopped as noted above, son agrees with permanently discontinuing anticoagulation    Acute thrombocytopenia - also from use of Xarelto - no need for Plt transfusion at this time - CBC In AM    Syncopal event - Secondary to acute blood loss anemia in the setting of Xarelto use - Orthostatics positive, secondary to bleeding - Transfuse as noted above - Up  with assistance only, due to high risk of fall    Pacemaker - dual chamber KeySpan implanted 2013    Persistent atrial fibrillation, CHADS2 = 5 - Will need to be off anticoagulation as outlined above - Rate controlled with Cardizem and atenolol per home medical regimen - continue amiodarone     Posterior epistaxis - Packing in place, per ENT doctor keep in place and place on Keflex with packing in place      Hypokalemia - supplement and repeat BMP in AM    Dementia without behavioral disturbance - Will need PT evaluation once more medically stable    Hyponatremia - pre renal etiology from from acute bleeding - monitor BMP    Laceration of right upper arm - thrombi pad in place    Underweight - nutritionist consulted - Body mass index is 16.15  DVT prophylaxis - SCD's  Code Status: Full.  Family Communication:  plan of care discussed with the patient and daughter-in-law at bedside  Disposition Plan: Home vs SNF, still not ready for d/c  IV access:  Peripheral IV  Procedures and diagnostic studies:    No results found.  Medical Consultants:  None  Other Consultants:  Nutritionist   IAnti-Infectives:   Keflex 8/30 --> for as long as she has packing in place   Faye Ramsay, MD  [facility] Pager [phone]  If 7PM-7AM, please contact night-coverage [email or web address] Password [facility] 10/17/2014, 6:58 AM  HPI/Subjective: No events overnight. Still bleeding from right arm laceration.   Objective: Filed Vitals:   10/16/14 2119 10/16/14 1303 10/16/14 2000 10/17/14 4174  BP: 138/69 130/62 128/67 122/65  Pulse: 74 78 70 70  Temp: 97.7 F (36.5 C) 97.9 F (36.6 C) 97.8 F (36.6 C) 98 F (36.7 C)  TempSrc: Oral Oral  Oral  Resp: 16 18 16 16   Height:      Weight:      SpO2: 98% 99% 97% 98%    Intake/Output Summary (Last 24 hours) at 10/17/14 0658 Last data filed at 10/17/14 0600  Gross per 24 hour  Intake   1900 ml  Output    200 ml  Net   1700 ml     Exam:   General:  Pt is alert, not in acute distress  Cardiovascular: Regular rate and rhythm, no rubs, no gallops  Respiratory: Clear to auscultation bilaterally, no wheezing, no crackles, no rhonchi  Abdomen: Soft, non tender, non distended, bowel sounds present, no guarding  Extremities: right arm laceration with bleeding  Data Reviewed: Basic Metabolic Panel:  Recent Labs Lab 10/15/14 1553 10/15/14 1558 10/17/14 0435  NA 134* 132* 128*  K 4.2 4.1 2.9*  CL 99* 97* 97*  CO2 26  --  25  GLUCOSE 128* 128* 100*  BUN 30* 29* 20  CREATININE 0.84 0.80 0.52  CALCIUM 8.2*  --  7.7*   CBC:  Recent Labs Lab 10/15/14 1553  10/15/14 1708 10/15/14 2325 10/16/14 0422 10/16/14 0956 10/17/14 0435  WBC 9.4  --   --   --   --  10.4 9.8  HGB 12.1  < > 11.8* 10.9* 10.3* 9.8*  9.9* 7.9*  HCT 36.7  < > 36.0 32.1* 30.4* 29.0*  28.8* 23.3*  MCV 91.8  --   --   --   --  90.6 90.3  PLT 169  --   --   --   --  153 138*  < > = values in this interval not displayed. Scheduled Meds: . amiodarone  400 mg Oral Daily  . atenolol  75 mg Oral BID  . bacitracin   Topical BID  . cephALEXin  500 mg Oral 4 times per day  . diltiazem  120 mg Oral Daily  . feeding supplement  1 Container Oral BID BM  . Influenza vac split quadrivalent PF  0.5 mL Intramuscular Tomorrow-1000  . sodium chloride  3 mL Intravenous Q12H   Continuous Infusions: . sodium chloride 50 mL/hr at 10/16/14 1702

## 2014-10-17 NOTE — Progress Notes (Signed)
OT Cancellation Note  Patient Details Name: Danashia Landers MRN: [id number] DOB: Jan 04, 1919   Cancelled Treatment:    Reason Eval/Treat Not Completed: Medical issues which prohibited therapy.  Pt receiving 2nd unit of blood.  Will check back tomorrow.  Mileah Hemmer 10/17/2014, 3:18 PM  Lesle Chris, OTR/L [phone] 10/17/2014

## 2014-10-18 DIAGNOSIS — I481 Persistent atrial fibrillation: Secondary | ICD-10-CM | POA: Diagnosis not present

## 2014-10-18 DIAGNOSIS — S41111A Laceration without foreign body of right upper arm, initial encounter: Secondary | ICD-10-CM | POA: Diagnosis not present

## 2014-10-18 LAB — BASIC METABOLIC PANEL
ANION GAP: 10 (ref 5–15)
BUN: 10 mg/dL (ref 6–20)
CALCIUM: 7.9 mg/dL — AB (ref 8.9–10.3)
CO2: 22 mmol/L (ref 22–32)
Chloride: 96 mmol/L — ABNORMAL LOW (ref 101–111)
Creatinine, Ser: 0.58 mg/dL (ref 0.44–1.00)
GFR calc Af Amer: >60 mL/min (ref >60)
Glucose, Bld: 107 mg/dL — ABNORMAL HIGH (ref 65–99)
POTASSIUM: 3.5 mmol/L (ref 3.5–5.1)
SODIUM: 128 mmol/L — AB (ref 135–145)

## 2014-10-18 LAB — TYPE AND SCREEN
ABO/RH(D): A POS
Antibody Screen: NEGATIVE
UNIT DIVISION: 0
Unit division: 0

## 2014-10-18 NOTE — Evaluation (Signed)
Occupational Therapy Evaluation Patient Details Name: Jamie Daugherty MRN: [id number] DOB: Jun 23, 1918 Today's Date: 10/18/2014    History of Present Illness 81 yo female admitted with epistaxis, R arm laceration. Hx of dementia, syncope and collapse. Pt lives alone   Clinical Impression   Pt was admitted for the above.  She will benefit from skilled OT to increase safety and independence with adls.  Pt was independent with adls prior to admission.  Currently she needs min A and goals in acute are set for min guard level of assist.    Follow Up Recommendations  Home health OT;Supervision/Assistance - 24 hour    Equipment Recommendations  None recommended by OT    Recommendations for Other Services       Precautions / Restrictions Precautions Precautions: Fall Precaution Comments: high fall risk Restrictions Weight Bearing Restrictions: No      Mobility Bed Mobility Overal bed mobility: Needs Assistance Bed Mobility: Supine to Sit      Sit to supine: Min guard   General bed mobility comments: for safety:  cues to stop scooting up the bed so she wouldn't hit head on headboard  Transfers Overall transfer level: Needs assistance Equipment used: Rolling Ma[place]re (2 wheeled) Transfers: Sit to/from [facility] Sit to Stand: Min assist Stand pivot transfers: Min assist       General transfer comment: hand held assist    Balance Overall balance assessment: Needs assistance;History of Falls         Standing balance support: During functional activity Standing balance-Leahy Scale: Poor                              ADL Overall ADL's : Needs assistance/impaired     Grooming: Oral care;Sitting       Lower Body Bathing: Minimal assistance;Sit to/from stand       Lower Body Dressing: Minimal assistance;Sit to/from stand   Toilet Transfer: Minimal assistance;Stand-pivot (recliner to bed)   Toileting- Clothing Manipulation and  Hygiene: Minimal assistance;Sit to/from stand         General ADL Comments: pt is usually independent with adls, does not use AD for ambulating.  Pt needs min A for balance for sit to stand and needed min A to don R sock      Vision     Perception     Praxis      Pertinent Vitals/Pain Pain Assessment: No/denies pain     Hand Dominance     Extremity/Trunk Assessment Upper Extremity Assessment Upper Extremity Assessment: Overall WFL for tasks assessed          Communication Communication Communication: No difficulties   Cognition Arousal/Alertness: Awake/alert Behavior During Therapy: WFL for tasks assessed/performed Overall Cognitive Status: History of cognitive impairments - at baseline       Memory: Decreased short-term memory             General Comments       Exercises       Shoulder Instructions      Home Living Family/patient expects to be discharged to:: Private residence Living Arrangements: Alone   Type of Home: Apartment Home Access: Level entry     Home Layout: One level         Bathroom Toilet: Standard     Home Equipment: Bedside commode          Prior Functioning/Environment Level of Independence: Independent  OT Diagnosis: Generalized weakness;Cognitive deficits   OT Problem List: Decreased strength;Decreased activity tolerance;Pain;Decreased knowledge of use of DME or AE;Decreased cognition   OT Treatment/Interventions: Self-care/ADL training;DME and/or AE instruction;Patient/family education;Balance training;Cognitive remediation/compensation;Therapeutic activities    OT Goals(Current goals can be found in the care plan section) Acute Rehab OT Goals Patient Stated Goal: none stated OT Goal Formulation: With patient Time For Goal Achievement: 10/25/14 Potential to Achieve Goals: Good ADL Goals Pt Will Perform Lower Body Bathing: with min guard assist;sit to/from stand Pt Will Perform Lower Body  Dressing: with min guard assist;sit to/from stand Pt Will Transfer to Toilet: with min guard assist;bedside commode;stand pivot transfer Pt Will Perform Toileting - Clothing Manipulation and hygiene: with min guard assist;sit to/from stand  OT Frequency: Min 2X/week   Barriers to D/C:            Co-evaluation              End of Session    Activity Tolerance: Patient tolerated treatment well Patient left: in bed;with call bell/phone within reach;with bed alarm set;with family/visitor present   Time: 5631-4970 OT Time Calculation (min): 21 min Charges:  OT General Charges $OT Visit: 1 Procedure OT Evaluation $Initial OT Evaluation Tier I: 1 Procedure G-Codes: OT G-codes **NOT FOR INPATIENT CLASS** Functional Assessment Tool Used: clinical observation Functional Limitation: Self care Self Care Current Status (Y6378): At least 20 percent but less than 40 percent impaired, limited or restricted Self Care Goal Status (H8850): At least 1 percent but less than 20 percent impaired, limited or restricted  Idrees Quam 10/18/2014, 2:04 PM  Lesle Chris, OTR/L [phone] 10/18/2014

## 2014-10-18 NOTE — Progress Notes (Signed)
Patient ID: Jode Lippe, female   DOB: Jun 25, 1918, 81 y.o.   MRN: [id number]  [facility] PROGRESS NOTE  Itzel Mckibbin HUT:[id number] DOB: [birth year] DOA: 10/15/2014 PCP: Stephens Shire, MD   Brief narrative:    81 y.o. female with known dementia, atrial fibrillation on Xarelto for 3 years, presented to [facility] emergency department with several days duration of persistent nosebleed. Please note that patient was unable to provide any history due to dementia, son at bedside able to provide some details and explained that patient has been complaining of dizziness at home and son called EMS. In emergency department, patient had syncopal event trying to use bedside commode, large amount of bloody stool was noted by staff. Patient sustained right arm laceration post syncopal event and had noticeable amount of bleeding. Per son's report, patient had an appointment scheduled with Dr. Redmond Baseman ENT for evaluation of epistaxis.  Assessment/Plan:    Active Problems:   Acute blood loss anemia - Bleeding from right arm post laceration, epistaxis, blood in stool - Likely secondary to use Xarelto  - Hemoglobin trend since admission: 11.8 --> 9.9 --> 7.9 --> 12 (after two U PRBC transfusion)  - transfused two units PRBC 9/1 - Continue to hold Xarelto - Discussed with son at bedside to completely stop anticoagulation due to high risk of bleed, he agrees with discontinuation per minute - We'll repeat CBC again in the morning - Patient does not want to have colonoscopy or endoscopy done, son at bedside agrees, conservative management only    Chronic anticoagulation, on Xarelto - Stopped as noted above, son agrees with permanently discontinuing anticoagulation    Acute thrombocytopenia - also from use of Xarelto, Plt trending up - no need for Plt transfusion at this time - CBC In AM    Syncopal event - Secondary to acute blood loss anemia in the setting of Xarelto use - Orthostatics  positive, secondary to bleeding - Up with assistance only, due to high risk of fall    Pacemaker - dual chamber KeySpan implanted 2013    Persistent atrial fibrillation, CHADS2 = 5 - Will need to be off anticoagulation as outlined above - Rate controlled with Cardizem and atenolol per home medical regimen - continue amiodarone     Posterior epistaxis - Packing in place, per ENT doctor keep in place and place on Keflex with packing in place      Hypokalemia - supplemented and WNL this AM    Dementia without behavioral disturbance - at baseline     Hyponatremia - pre renal etiology from from acute bleeding - monitor BMP    Laceration of right upper arm - thrombi pad in place    Underweight - nutritionist consulted - Body mass index is 16.15  DVT prophylaxis - SCD's  Code Status: Full.  Family Communication:  plan of care discussed with the patient and daughter-in-law at bedside  Disposition Plan: Home in 24 - 48 hours   IV access:  Peripheral IV  Procedures and diagnostic studies:    No results found.  Medical Consultants:  None  Other Consultants:  Nutritionist   IAnti-Infectives:   Keflex 8/30 --> for as long as she has packing in place   Faye Ramsay, MD  [facility] Pager [phone]  If 7PM-7AM, please contact night-coverage [email or web address] Password [email or web address] 10/18/2014, 12:40 PM  HPI/Subjective: No events overnight.   Objective: Filed Vitals:   10/17/14 1338 10/17/14 1702 10/17/14 2038 10/18/14 0533  BP: 126/55 149/68 151/68 157/74  Pulse: 74 72 95 98  Temp: 97.7 F (36.5 C) 97.4 F (36.3 C) 97.7 F (36.5 C) 97.7 F (36.5 C)  TempSrc: Oral Oral Oral Oral  Resp: 16 20 20    Height:      Weight:      SpO2: 99% 96% 98% 98%    Intake/Output Summary (Last 24 hours) at 10/18/14 1240 Last data filed at 10/18/14 1101  Gross per 24 hour  Intake   1223 ml  Output   1710 ml  Net   -487 ml    Exam:   General:  Pt is alert, not in acute  distress  Cardiovascular: Regular rate and rhythm, no rubs, no gallops  Respiratory: Clear to auscultation bilaterally, no wheezing, no crackles, no rhonchi  Abdomen: Soft, non tender, non distended, bowel sounds present, no guarding  Extremities: right arm laceration with bleeding  Data Reviewed: Basic Metabolic Panel:  Recent Labs Lab 10/15/14 1553 10/15/14 1558 10/17/14 0435 10/18/14 0522  NA 134* 132* 128* 128*  K 4.2 4.1 2.9* 3.5  CL 99* 97* 97* 96*  CO2 26  --  25 22  GLUCOSE 128* 128* 100* 107*  BUN 30* 29* 20 10  CREATININE 0.84 0.80 0.52 0.58  CALCIUM 8.2*  --  7.7* 7.9*   CBC:  Recent Labs Lab 10/15/14 1553  10/15/14 2325 10/16/14 0422 10/16/14 0956 10/17/14 0435 10/18/14 0522  WBC 9.4  --   --   --  10.4 9.8 10.6*  HGB 12.1  < > 10.9* 10.3* 9.8*  9.9* 7.9* 12.2  HCT 36.7  < > 32.1* 30.4* 29.0*  28.8* 23.3* 35.2*  MCV 91.8  --   --   --  90.6 90.3 87.6  PLT 169  --   --   --  153 138* 145*  < > = values in this interval not displayed. Scheduled Meds: . amiodarone  400 mg Oral Daily  . atenolol  75 mg Oral BID  . bacitracin   Topical BID  . cephALEXin  500 mg Oral 4 times per day  . diltiazem  120 mg Oral Daily  . feeding supplement (ENSURE ENLIVE)  237 mL Oral BID BM  . Influenza vac split quadrivalent PF  0.5 mL Intramuscular Tomorrow-1000  . sodium chloride  3 mL Intravenous Q12H   Continuous Infusions:

## 2014-10-18 NOTE — Progress Notes (Signed)
CSW reviewed PT evaluation recommending SNF at discharge, though patient is currently classified under Observation status. CSW met with patient & daughter-in-law, Meghanne Pletz (cell#: [phone]) at bedside to inform that if patient goes to SNF, Medicare would not cover stay. Daughter-in-law states patient can return home and they can provide the 24 hour supervision - son & daughter-in-law live a mile down the road from her and check on her frequently. RNCM, Cookie aware & will setup home health services.   No further CSW needs identified - CSW signing off.   Raynaldo Opitz, [facility] Clinical Social Worker cell #: [phone]

## 2014-10-18 NOTE — Consult Note (Signed)
   [facility] CM Inpatient Consult   10/18/2014  Jamie Daugherty 21-Jul-1918 [id number]   Referral received from inpatient [facility] for [place] [facility] services.Cross checked for eligibility. Unfortunately, patient is not eligible due to not being in a [facility] risk affiliated contract. Made inpatient RNCM aware.  Marthenia Rolling, MSN-Ed, RN,BSN [facility] Liaison [phone]

## 2014-10-18 NOTE — Evaluation (Signed)
Physical Therapy Evaluation Patient Details Name: Jamie Daugherty MRN: [id number] DOB: [birth year] Today's Date: 10/18/2014   History of Present Illness  81 yo female admitted with epistaxis, R arm laceration. Hx of dementia, syncope and collapse. Pt lives alone  Clinical Impression  On eval, pt required Min assist for mobility-pt was able to take a few steps in room before needing to sit. Pt is orthostatic and symptomatic. High fall risk!. Do not feel pt will be able to safely mobilize/manage at home alone. Recommend SNF. BP during session: 123/57 supine, 100/89 sitting, 92/53 standing.     Follow Up Recommendations SNF (if pt discharges home, she will require 24 hour supervision/assist. High fall risk)    Equipment Recommendations       Recommendations for Other Services OT consult     Precautions / Restrictions Precautions Precautions: Fall Precaution Comments: high fall risk Restrictions Weight Bearing Restrictions: No      Mobility  Bed Mobility Overal bed mobility: Needs Assistance Bed Mobility: Supine to Sit     Supine to sit: Min guard     General bed mobility comments: close guard for safety  Transfers Overall transfer level: Needs assistance Equipment used: Rolling Stro[place] (2 wheeled) Transfers: Sit to/from Stand Sit to Stand: Min assist         General transfer comment: assist to rise, stabilize, control descent.   Ambulation/Gait Ambulation/Gait assistance: Min assist Ambulation Distance (Feet): 3 Feet Assistive device: 1 person hand held assist Gait Pattern/deviations: Decreased stride length;Decreased step length - right;Decreased step length - left     General Gait Details: very unsteady. pt took a few steps over to sink to wash her hands. after standing to complete task, pt requested to sit due to dizziness, fatigue. Pt did not feel she could walk any farther during this session  Stairs            Wheelchair Mobility    Modified Rankin  (Stroke Patients Only)       Balance Overall balance assessment: Needs assistance;History of Falls         Standing balance support: During functional activity Standing balance-Leahy Scale: Poor                               Pertinent Vitals/Pain Pain Assessment: No/denies pain    Home Living Family/patient expects to be discharged to:: Private residence Living Arrangements: Alone   Type of Home: Apartment Home Access: Level entry     Home Layout: One level        Prior Function Level of Independence: Independent               Hand Dominance        Extremity/Trunk Assessment   Upper Extremity Assessment: Defer to OT evaluation           Lower Extremity Assessment: Generalized weakness         Communication   Communication: No difficulties  Cognition Arousal/Alertness: Awake/alert Behavior During Therapy: WFL for tasks assessed/performed Overall Cognitive Status: History of cognitive impairments - at baseline       Memory: Decreased short-term memory              General Comments      Exercises        Assessment/Plan    PT Assessment Patient needs continued PT services  PT Diagnosis Difficulty walking;Generalized weakness   PT Problem List Decreased strength;Decreased activity tolerance;Decreased balance;Decreased  mobility;Decreased cognition  PT Treatment Interventions DME instruction;Gait training;Functional mobility training;Therapeutic activities;Patient/family education;Balance training;Therapeutic exercise   PT Goals (Current goals can be found in the Care Plan section) Acute Rehab PT Goals Patient Stated Goal: none stated PT Goal Formulation: With patient Time For Goal Achievement: 11/01/14 Potential to Achieve Goals: Good    Frequency Min 3X/week   Barriers to discharge        Co-evaluation               End of Session Equipment Utilized During Treatment: Gait belt Activity Tolerance:  Patient limited by fatigue (limited by dizziness) Patient left: in chair;with call bell/phone within reach;with chair alarm set      Functional Assessment Tool Used: clinical judgement Functional Limitation: Mobility: Walking and moving around Mobility: Walking and Moving Around Current Status [phone]): At least 20 percent but less than 40 percent impaired, limited or restricted Mobility: Walking and Moving Around Goal Status [phone]): At least 1 percent but less than 20 percent impaired, limited or restricted    Time: 1011-1028 PT Time Calculation (min) (ACUTE ONLY): 17 min   Charges:   PT Evaluation $Initial PT Evaluation Tier I: 1 Procedure     PT G Codes:   PT G-Codes **NOT FOR INPATIENT CLASS** Functional Assessment Tool Used: clinical judgement Functional Limitation: Mobility: Walking and moving around Mobility: Walking and Moving Around Current Status (A2505): At least 20 percent but less than 40 percent impaired, limited or restricted Mobility: Walking and Moving Around Goal Status [phone]): At least 1 percent but less than 20 percent impaired, limited or restricted    Weston Anna, MPT Pager: [phone]

## 2014-10-19 DIAGNOSIS — E43 Unspecified severe protein-calorie malnutrition: Secondary | ICD-10-CM

## 2014-10-19 DIAGNOSIS — I481 Persistent atrial fibrillation: Secondary | ICD-10-CM | POA: Diagnosis not present

## 2014-10-19 DIAGNOSIS — D62 Acute posthemorrhagic anemia: Secondary | ICD-10-CM | POA: Diagnosis present

## 2014-10-19 DIAGNOSIS — S41111D Laceration without foreign body of right upper arm, subsequent encounter: Secondary | ICD-10-CM | POA: Diagnosis not present

## 2014-10-19 DIAGNOSIS — R040 Epistaxis: Secondary | ICD-10-CM | POA: Diagnosis not present

## 2014-10-19 LAB — BASIC METABOLIC PANEL
ANION GAP: 7 (ref 5–15)
BUN: 14 mg/dL (ref 6–20)
CALCIUM: 7.7 mg/dL — AB (ref 8.9–10.3)
CHLORIDE: 94 mmol/L — AB (ref 101–111)
CO2: 25 mmol/L (ref 22–32)
Creatinine, Ser: 0.63 mg/dL (ref 0.44–1.00)
GFR calc Af Amer: >60 mL/min (ref >60)
GFR calc non Af Amer: >60 mL/min (ref >60)
GLUCOSE: 102 mg/dL — AB (ref 65–99)
POTASSIUM: 3 mmol/L — AB (ref 3.5–5.1)
Sodium: 126 mmol/L — ABNORMAL LOW (ref 135–145)

## 2014-10-19 LAB — URINALYSIS, ROUTINE W REFLEX MICROSCOPIC
Bilirubin Urine: NEGATIVE
Glucose, UA: NEGATIVE mg/dL
KETONES UR: NEGATIVE mg/dL
NITRITE: NEGATIVE
PROTEIN: NEGATIVE mg/dL
Specific Gravity, Urine: 1.015 (ref 1.005–1.030)
UROBILINOGEN UA: 0.2 mg/dL (ref 0.0–1.0)
pH: 6 (ref 5.0–8.0)

## 2014-10-19 LAB — URINE MICROSCOPIC-ADD ON

## 2014-10-19 LAB — CBC
HCT: 35.2 % — ABNORMAL LOW (ref 36.0–46.0)
HEMATOCRIT: 33.6 % — AB (ref 36.0–46.0)
HEMOGLOBIN: 11.6 g/dL — AB (ref 12.0–15.0)
Hemoglobin: 12.2 g/dL (ref 12.0–15.0)
MCH: 30.3 pg (ref 26.0–34.0)
MCH: 30.6 pg (ref 26.0–34.0)
MCHC: 34.7 g/dL (ref 30.0–36.0)
MCHC: 34.5 g/dL (ref 30.0–36.0)
MCV: 87.6 fL (ref 78.0–100.0)
MCV: 88.7 fL (ref 78.0–100.0)
PLATELETS: 145 10*3/uL — AB (ref 150–400)
Platelets: 160 10*3/uL (ref 150–400)
RBC: 4.02 MIL/uL (ref 3.87–5.11)
RBC: 3.79 MIL/uL — ABNORMAL LOW (ref 3.87–5.11)
RDW: 14.5 % (ref 11.5–15.5)
RDW: 14.4 % (ref 11.5–15.5)
WBC: 10.6 10*3/uL — AB (ref 4.0–10.5)
WBC: 11.3 10*3/uL — AB (ref 4.0–10.5)

## 2014-10-19 LAB — OSMOLALITY: Osmolality: 273 mOsm/kg — ABNORMAL LOW (ref 275–300)

## 2014-10-19 LAB — SODIUM, URINE, RANDOM: SODIUM UR: 66 mmol/L

## 2014-10-19 LAB — URIC ACID: URIC ACID, SERUM: 3.2 mg/dL (ref 2.3–6.6)

## 2014-10-19 LAB — CREATININE, URINE, RANDOM: CREATININE, URINE: 67.84 mg/dL

## 2014-10-19 LAB — OSMOLALITY, URINE: Osmolality, Ur: 470 mOsm/kg (ref 390–1090)

## 2014-10-19 MED ORDER — POTASSIUM CHLORIDE CRYS ER 20 MEQ PO TBCR
40.0000 meq | EXTENDED_RELEASE_TABLET | ORAL | Status: AC
  Administered 2014-10-19 (×2): 40 meq via ORAL
  Filled 2014-10-19 (×2): qty 2

## 2014-10-19 MED ORDER — SODIUM CHLORIDE 1 G PO TABS
1.0000 g | ORAL_TABLET | Freq: Three times a day (TID) | ORAL | Status: DC
  Administered 2014-10-19 – 2014-10-23 (×12): 1 g via ORAL
  Filled 2014-10-19 (×12): qty 1

## 2014-10-19 NOTE — Clinical Social Work Note (Signed)
CSW spoke with pt's son regarding pt needs and discharge plans  CSW provided explanation of recent review of chart notes that reflected pt had been on observation for 5 days.  CSW reviewed the week day CSW note that reflected that CSW had met with pt's daughter in law and had explained the OBS status and what met the criteria for medicare to pay for a SNF.  CSW explained that she had called the MD and had inquired about the OBS status asking why the 5 days of OBS.  CSW explained to pt's son that MD stated that he had just taken over the case and in his opinion pt should be in patient and he agreed to order this for pt and change in the system.  Pt's son very frustrated and confused stating that MD yesterday had stated that she would meet with family today and discuss the discharge.  Then today pt's son stated that that MD was gone, there was a new MD and pt was not discharging.  Also this MD was recommending SNF for sure and pt's son stated that this was not clear before and he wanted someone to tell him what was the best thing to do.    CSW explained the in patient /obs status and how the insurance companies look at paying for the SNF's.  CSW also let pt's son know that Md did indeed change pt from OBS to inpatient today and that she could perhaps be here for more nights. CSW also explained that pt could be switched back to OBS or be ready for discharge before the 3 nights wee up. Either way CSW agreed to send pt information to SNFs so that family would be prepared at discharge  CSW will also follow up with supervisor to see if in patient status could be converted back a couple of days or if OBS was correct status  .Dede Query, LCSW [facility] Clinical Social Worker - Weekend Coverage cell #: [phone]

## 2014-10-19 NOTE — Clinical Social Work Note (Signed)
CSW received consult this afternoon for pt SNF.  CSW reviewed pt chart that reflected pt has been observation status for 5 days.  Review reflected that case manager set up home health services on Friday 10/18/14 and the CSW signed off so this pt was not on hand off for today.  CSW also reviewed MD note from yesterday that read that pt would be discharged within 24 to 48 hours.  This would make OBS extremely long and confusing why pt is still under OBS .  CSW called and left message for case manager Edwin Cap today.  Case manager is working at [employer] and covering for both [employer] and [place] so [place] will wait to hear back from her  CSW will also call and speak with assistant director to clarify how to proceed further.  CSW will also follow up with MD to see medical necessity.    CSW also called and left a message for pt's son  .Dede Query, LCSW [facility] Clinical Social Worker - Weekend Coverage cell #: [phone]

## 2014-10-19 NOTE — Care Management Note (Signed)
Case Management Note  Patient Details  Name: Jamie Daugherty MRN: [id number] Date of Birth: [birth year]  Subjective/Objective:      anemia              Action/Plan: Call from [place] for clarification on pt status.  Plan for dc to SNF. Pt is observation appropriate. UR completed and approved for obs-cont stay. Plan for dc to SNF when medically stable.    Expected Discharge Date:  10/20/2014               Expected Discharge Plan:  [place]  In-House Referral:  Clinical Social Work  Discharge planning Services  CM Consult   Status of Service:  Completed, signed off  Medicare Important Message Given:    Date Medicare IM Given:    Medicare IM give by:    Date Additional Medicare IM Given:    Additional Medicare Important Message give by:     If discussed at [place] of Stay Meetings, dates discussed:    Additional Comments:  Erenest Rasher, RN 10/19/2014, 5:43 PM

## 2014-10-19 NOTE — Clinical Social Work Note (Signed)
CSW called and spoke with MD asking why pt was still in Obs and in hospital  MD stated that he just received this pt today and he would transfer her to in patient.  Dede Query, LCSW [place] Worker - Weekend Coverage cell #: [phone]

## 2014-10-19 NOTE — Progress Notes (Signed)
Patient Demographics:    Jamie Daugherty, is a 81 y.o. female, DOB - January 14, 1919, HDQ:[id number]  Admit date - 10/15/2014   Admitting Physician Waldemar Dickens, MD  Outpatient Primary MD for the patient is Stephens Shire, MD  LOS -    Chief Complaint  Patient presents with  . Epistaxis      Summary  81 y.o. female with known dementia, atrial fibrillation on Xarelto for 3 years, presented to [facility] emergency department with several days duration of persistent nosebleed. Please note that patient was unable to provide any history due to dementia, son at bedside able to provide some details and explained that patient has been complaining of dizziness at home and son called EMS. In emergency department, patient had syncopal event trying to use bedside commode, large amount of bloody stool was noted by staff. Patient sustained right arm laceration post syncopal event and had noticeable amount of bleeding. Per son's report, patient had an appointment scheduled with Dr. Redmond Baseman ENT for evaluation of epistaxis.     Subjective:    Jamie Daugherty today has, No headache, No chest pain, No abdominal pain - No Nausea, No new weakness tingling or numbness, No Cough - SOB. Is pleasantly confused and appears to be a poor historian   Assessment  & Plan :      Acute blood loss anemia - Bleeding from right arm post laceration, epistaxis, blood in stool - Likely secondary to use Xarelto  - Hemoglobin trend since admission: 11.8 --> 9.9 --> 7.9 --> 12 (after two U PRBC transfusion)  - transfused two units PRBC 9/1 - Continue to hold Xarelto, Previous physician had Discussed with son at bedside to completely stop anticoagulation due to high risk of bleed, he agrees with discontinuation off anticoagulation. - H&H  now stable. - Patient does not want to have colonoscopy or endoscopy done, son at bedside agrees, conservative management only   Chronic anticoagulation, on Xarelto - Stopped as noted above, son agrees with permanently discontinuing anticoagulation   Acute thrombocytopenia - due to bleeding. Stable.   Syncopal event - Secondary to acute blood loss anemia in the setting of Xarelto use - Orthostatics positive, secondary to bleeding - Up with assistance only, due to high risk of fall, seen by PT will require SNF.   Pacemaker - dual chamber KeySpan implanted 2013   Persistent atrial fibrillation, CHADS2 = 5 - Will need to be off anticoagulation as outlined above - Rate controlled with Cardizem and atenolol per home medical regimen - continue amiodarone    Posterior epistaxis - Packing in place, per ENT doctor keep in place and place on Keflex with packing in place , outpatient follow-up with ENT postdischarge within 3-4 days.   Hypokalemia - replace, recheck in the morning with magnesium   Dementia without behavioral disturbance - at baseline    Hyponatremia - hyponatremia is getting worse, we'll obtain urine osmolality, serum osmolality and sodium. Check uric acid. Hold IV fluids. Hold IV fluids and repeat BMP in the morning. Could be SIADH    Laceration of right upper arm - supportive care now.    Underweight - nutritionist consulted - Body mass index is 16.15    Code Status : Full  Family Communication  : None present  Disposition Plan  : SNF  Consults  :  [facility]  Procedures  :    DVT Prophylaxis  :   SCDs    Lab Results  Component Value Date   PLT 160 10/19/2014    Inpatient Medications  Scheduled Meds: . amiodarone  400 mg Oral Daily  . atenolol  75 mg Oral BID  . bacitracin   Topical BID  . cephALEXin  500 mg Oral 4 times per day  . diltiazem  120 mg Oral Daily  . feeding supplement (ENSURE ENLIVE)  237 mL Oral BID BM    . Influenza vac split quadrivalent PF  0.5 mL Intramuscular Tomorrow-1000  . potassium chloride  40 mEq Oral Q4H  . sodium chloride  3 mL Intravenous Q12H   Continuous Infusions:  PRN Meds:.acetaminophen **OR** acetaminophen, ondansetron **OR** ondansetron (ZOFRAN) IV  Antibiotics  :     Anti-infectives    Start     Dose/Rate Route Frequency Ordered Stop   10/16/14 1200  cephALEXin (KEFLEX) capsule 500 mg     500 mg Oral 4 times per day 10/16/14 0945     10/15/14 1500  cephALEXin (KEFLEX) capsule 500 mg     500 mg Oral  Once 10/15/14 1458 10/15/14 1600        Objective:   Filed Vitals:   10/18/14 0533 10/18/14 1358 10/18/14 2000 10/19/14 0510  BP: 157/74 128/76 137/64 134/62  Pulse: 98 75 71 76  Temp: 97.7 F (36.5 C) 97.9 F (36.6 C) 98.1 F (36.7 C) 98.9 F (37.2 C)  TempSrc: Oral Oral Oral Oral  Resp:  18 18 18   Height:      Weight:      SpO2: 98% 98% 95% 94%    Wt Readings from Last 3 Encounters:  10/15/14 45.36 kg (100 lb)  09/17/14 45.813 kg (101 lb)  06/13/14 47.764 kg (105 lb 4.8 oz)     Intake/Output Summary (Last 24 hours) at 10/19/14 1113 Last data filed at 10/19/14 1041  Gross per 24 hour  Intake    810 ml  Output    450 ml  Net    360 ml     Physical Exam  Awake, pleasantly confused , No new F.N deficits, Normal affect [place].AT,PERRAL, L nasal packing Supple Neck,No JVD, No cervical lymphadenopathy appriciated.  Symmetrical Chest wall movement, Good air movement bilaterally, CTAB RRR,No Gallops,Rubs or new Murmurs, No Parasternal Heave +ve B.Sounds, Abd Soft, No tenderness, No organomegaly appriciated, No rebound - guarding or rigidity. No Cyanosis, Clubbing or edema, No new Rash or bruise      Data Review:   Micro Results No results found for this or any previous visit (from the past 240 hour(s)).  Radiology Reports No results found.   CBC  Recent Labs Lab 10/15/14 1553  10/16/14 0422 10/16/14 0956 10/17/14 0435 10/18/14 0522  10/19/14 0531  WBC 9.4  --   --  10.4 9.8 10.6* 11.3*  HGB 12.1  < > 10.3* 9.8*  9.9* 7.9* 12.2 11.6*  HCT 36.7  < > 30.4* 29.0*  28.8* 23.3* 35.2* 33.6*  PLT 169  --   --  153 138* 145* 160  MCV 91.8  --   --  90.6 90.3 87.6 88.7  MCH 30.3  --   --  30.6 30.6 30.3 30.6  MCHC 33.0  --   --  33.8 33.9 34.7 34.5  RDW 14.3  --   --  14.2 14.0 14.5 14.4  < > = values in this interval not displayed.  Chemistries   Recent Labs Lab 10/15/14 1553 10/15/14 1558 10/17/14 0435 10/18/14 0522 10/19/14 0531  NA 134* 132* 128* 128* 126*  K 4.2 4.1 2.9* 3.5 3.0*  CL 99* 97* 97* 96* 94*  CO2 26  --  25 22 25   GLUCOSE 128* 128* 100* 107* 102*  BUN 30* 29* 20 10 14   CREATININE 0.84 0.80 0.52 0.58 0.63  CALCIUM 8.2*  --  7.7* 7.9* 7.7*   ------------------------------------------------------------------------------------------------------------------ estimated creatinine clearance is 31.5 mL/min (by C-G formula based on Cr of 0.63). ------------------------------------------------------------------------------------------------------------------ No results for input(s): HGBA1C in the last 72 hours. ------------------------------------------------------------------------------------------------------------------ No results for input(s): CHOL, HDL, LDLCALC, TRIG, CHOLHDL, LDLDIRECT in the last 72 hours. ------------------------------------------------------------------------------------------------------------------ No results for input(s): TSH, T4TOTAL, T3FREE, THYROIDAB in the last 72 hours.  Invalid input(s): FREET3 ------------------------------------------------------------------------------------------------------------------ No results for input(s): VITAMINB12, FOLATE, FERRITIN, TIBC, IRON, RETICCTPCT in the last 72 hours.  Coagulation profile  Recent Labs Lab 10/15/14 1553 10/16/14 1050  INR 3.31* 1.27    No results for input(s): DDIMER in the last 72 hours.  Cardiac  Enzymes No results for input(s): CKMB, TROPONINI, MYOGLOBIN in the last 168 hours.  Invalid input(s): CK ------------------------------------------------------------------------------------------------------------------ Invalid input(s): POCBNP   Time Spent in minutes   35   SINGH,PRASHANT K M.D on 10/19/2014 at 11:13 AM  Between 7am to 7pm - Pager - [phone]  After 7pm go to [email or web address] - password [facility]  [facility] -  Office  [phone]

## 2014-10-19 NOTE — Progress Notes (Signed)
OT Cancellation Note  Patient Details Name: Jamie Daugherty MRN: [id number] DOB: [birth year]   Cancelled Treatment:    Reason Eval/Treat Not Completed: Fatigue/lethargy limiting ability to participate Pt stated she was tired after family leaving.   Betsy Pries 10/19/2014, 1:58 PM

## 2014-10-19 NOTE — Clinical Social Work Note (Signed)
CSW faxed pt out to SNFs  Will follow up tomorrow to see what facilities can accommodate pt. CSW also followed up with supervisor to see how if there is something that should be done to facilitate retroactive in patient status.  Supervisor stated it is up to RN/case Clinical cytogeneticist so CSW will send case manager a text to review  .Dede Query, LCSW [facility] Clinical Social Worker - Weekend Coverage cell #: [phone]

## 2014-10-20 DIAGNOSIS — F0390 Unspecified dementia without behavioral disturbance: Secondary | ICD-10-CM | POA: Diagnosis present

## 2014-10-20 DIAGNOSIS — D649 Anemia, unspecified: Secondary | ICD-10-CM | POA: Diagnosis present

## 2014-10-20 DIAGNOSIS — E871 Hypo-osmolality and hyponatremia: Secondary | ICD-10-CM | POA: Diagnosis present

## 2014-10-20 DIAGNOSIS — I481 Persistent atrial fibrillation: Secondary | ICD-10-CM | POA: Diagnosis present

## 2014-10-20 DIAGNOSIS — D62 Acute posthemorrhagic anemia: Secondary | ICD-10-CM | POA: Diagnosis present

## 2014-10-20 DIAGNOSIS — T45515A Adverse effect of anticoagulants, initial encounter: Secondary | ICD-10-CM | POA: Diagnosis present

## 2014-10-20 DIAGNOSIS — Z880 Allergy status to penicillin: Secondary | ICD-10-CM | POA: Diagnosis not present

## 2014-10-20 DIAGNOSIS — Z681 Body mass index (BMI) 19 or less, adult: Secondary | ICD-10-CM | POA: Diagnosis not present

## 2014-10-20 DIAGNOSIS — Z8249 Family history of ischemic heart disease and other diseases of the circulatory system: Secondary | ICD-10-CM | POA: Diagnosis not present

## 2014-10-20 DIAGNOSIS — I10 Essential (primary) hypertension: Secondary | ICD-10-CM | POA: Diagnosis present

## 2014-10-20 DIAGNOSIS — N390 Urinary tract infection, site not specified: Secondary | ICD-10-CM | POA: Diagnosis not present

## 2014-10-20 DIAGNOSIS — X58XXXA Exposure to other specified factors, initial encounter: Secondary | ICD-10-CM | POA: Diagnosis present

## 2014-10-20 DIAGNOSIS — K921 Melena: Secondary | ICD-10-CM | POA: Diagnosis present

## 2014-10-20 DIAGNOSIS — E43 Unspecified severe protein-calorie malnutrition: Secondary | ICD-10-CM | POA: Diagnosis present

## 2014-10-20 DIAGNOSIS — D696 Thrombocytopenia, unspecified: Secondary | ICD-10-CM | POA: Diagnosis present

## 2014-10-20 DIAGNOSIS — D72829 Elevated white blood cell count, unspecified: Secondary | ICD-10-CM | POA: Diagnosis present

## 2014-10-20 DIAGNOSIS — Z7901 Long term (current) use of anticoagulants: Secondary | ICD-10-CM | POA: Diagnosis not present

## 2014-10-20 DIAGNOSIS — R040 Epistaxis: Secondary | ICD-10-CM | POA: Diagnosis present

## 2014-10-20 DIAGNOSIS — K920 Hematemesis: Secondary | ICD-10-CM | POA: Diagnosis present

## 2014-10-20 DIAGNOSIS — Z87891 Personal history of nicotine dependence: Secondary | ICD-10-CM | POA: Diagnosis not present

## 2014-10-20 DIAGNOSIS — Z950 Presence of cardiac pacemaker: Secondary | ICD-10-CM | POA: Diagnosis not present

## 2014-10-20 DIAGNOSIS — R55 Syncope and collapse: Secondary | ICD-10-CM | POA: Diagnosis present

## 2014-10-20 DIAGNOSIS — Z79899 Other long term (current) drug therapy: Secondary | ICD-10-CM | POA: Diagnosis not present

## 2014-10-20 DIAGNOSIS — S41111A Laceration without foreign body of right upper arm, initial encounter: Secondary | ICD-10-CM | POA: Diagnosis present

## 2014-10-20 DIAGNOSIS — E876 Hypokalemia: Secondary | ICD-10-CM | POA: Diagnosis present

## 2014-10-20 LAB — URINE CULTURE: Culture: NO GROWTH

## 2014-10-20 LAB — BASIC METABOLIC PANEL
Anion gap: 6 (ref 5–15)
BUN: 12 mg/dL (ref 6–20)
CHLORIDE: 97 mmol/L — AB (ref 101–111)
CO2: 28 mmol/L (ref 22–32)
CREATININE: 0.62 mg/dL (ref 0.44–1.00)
Calcium: 8.1 mg/dL — ABNORMAL LOW (ref 8.9–10.3)
GFR calc non Af Amer: >60 mL/min (ref >60)
Glucose, Bld: 101 mg/dL — ABNORMAL HIGH (ref 65–99)
Potassium: 4.1 mmol/L (ref 3.5–5.1)
SODIUM: 131 mmol/L — AB (ref 135–145)

## 2014-10-20 LAB — CBC
HCT: 35.5 % — ABNORMAL LOW (ref 36.0–46.0)
Hemoglobin: 11.8 g/dL — ABNORMAL LOW (ref 12.0–15.0)
MCH: 30.1 pg (ref 26.0–34.0)
MCHC: 33.2 g/dL (ref 30.0–36.0)
MCV: 90.6 fL (ref 78.0–100.0)
PLATELETS: 181 10*3/uL (ref 150–400)
RBC: 3.92 MIL/uL (ref 3.87–5.11)
RDW: 14.6 % (ref 11.5–15.5)
WBC: 10.5 10*3/uL (ref 4.0–10.5)

## 2014-10-20 LAB — MAGNESIUM: MAGNESIUM: 1.8 mg/dL (ref 1.7–2.4)

## 2014-10-20 MED ORDER — DILTIAZEM HCL ER COATED BEADS 120 MG PO CP24
120.0000 mg | ORAL_CAPSULE | Freq: Every day | ORAL | Status: DC
  Administered 2014-10-20 – 2014-10-23 (×4): 120 mg via ORAL
  Filled 2014-10-20 (×4): qty 1

## 2014-10-20 MED ORDER — DEXTROSE 5 % IV SOLN
1.0000 g | INTRAVENOUS | Status: DC
  Administered 2014-10-20 – 2014-10-23 (×4): 1 g via INTRAVENOUS
  Filled 2014-10-20 (×4): qty 10

## 2014-10-20 NOTE — Progress Notes (Signed)
Patient ID: Jamie Daugherty, female   DOB: [birth year], 81 y.o.   MRN: [id number]  [facility] PROGRESS NOTE  Anshu Wehner MBW:[id number] DOB: 10-03-1918 DOA: 10/15/2014 PCP: Stephens Shire, MD   Brief narrative:    81 y.o. female with known dementia, atrial fibrillation on Xarelto for 3 years, presented to [facility] emergency department with several days duration of persistent nosebleed. Please note that patient was unable to provide any history due to dementia, son at bedside able to provide some details and explained that patient has been complaining of dizziness at home and son called EMS. In emergency department, patient had syncopal event trying to use bedside commode, large amount of bloody stool was noted by staff. Patient sustained right arm laceration post syncopal event and had noticeable amount of bleeding. Per son's report, patient had an appointment scheduled with Dr. Redmond Baseman ENT for evaluation of epistaxis.  Assessment/Plan:    Active Problems:   Acute blood loss anemia - Bleeding from right arm post laceration, epistaxis, blood in stool - Likely secondary to use Xarelto  - Hemoglobin trend since admission: 11.8 --> 9.9 --> 7.9 --> 12 (after two U PRBC transfusion)  - transfused two units PRBC 9/1 - Continue to hold Xarelto    UTI 9/3 - UA suggestive of UTI, pt with some dysuria and urinary urgency, leukocytosis on blood work  - placed on empiric rocephin and will follow up on urine cultures     Chronic anticoagulation, on Xarelto - Stopped as noted above, son agrees with permanently discontinuing anticoagulation    Acute thrombocytopenia - also from use of Xarelto, Plt trending up - no need for Plt transfusion at this time - CBC In AM    Syncopal event - Secondary to acute blood loss anemia in the setting of Xarelto use - Orthostatics positive, secondary to bleeding - Up with assistance only, due to high risk of fall - SNF recommended upon discharge, work  in progress    Pacemaker - dual chamber KeySpan implanted 2013    Persistent atrial fibrillation, CHADS2 = 5 - Will need to be off anticoagulation as outlined above - Rate controlled with Cardizem and atenolol per home medical regimen - continue amiodarone     Posterior epistaxis - Packing in place, per ENT doctor keep in place and place on Keflex with packing in place      Hypokalemia - supplemented and WNL this AM    Dementia without behavioral disturbance - at baseline     Hyponatremia - pre renal etiology from from acute bleeding - Na improving - repeat BMP in AM    Laceration of right upper arm - thrombi pad in place    Underweight - nutritionist consulted - Body mass index is 16.15  DVT prophylaxis - SCD's  Code Status: Full.  Family Communication:  plan of care discussed with the patient and daughter-in-law at bedside  Disposition Plan: SNF when bed available   IV access:  Peripheral IV  Procedures and diagnostic studies:    No results found.  Medical Consultants:  None  Other Consultants:  Nutritionist   IAnti-Infectives:   Keflex 8/30 --> for as long as she has packing in place transition to Rocephin to cover for UTI 9/4 -->  Faye Ramsay, MD  [facility] Pager [phone]  If 7PM-7AM, please contact night-coverage [email or web address] Password [email or web address] 10/20/2014, 12:26 PM  HPI/Subjective: No events overnight.   Objective: Filed Vitals:   10/19/14 1419 10/19/14 2111 10/20/14 0300 10/20/14 9233  BP: 111/50 125/60 111/90 117/52  Pulse: 69 72 77 71  Temp: 98 F (36.7 C) 98.6 F (37 C) 97.8 F (36.6 C)   TempSrc: Oral Oral Oral   Resp: 16 16 16    Height:      Weight:      SpO2: 99% 98% 96%     Intake/Output Summary (Last 24 hours) at 10/20/14 1226 Last data filed at 10/20/14 0808  Gross per 24 hour  Intake   1070 ml  Output    550 ml  Net    520 ml    Exam:   General:  Pt is alert, not in acute distress, intermittently confused    Cardiovascular: Regular rate and rhythm, no rubs, no gallops  Respiratory: Clear to auscultation bilaterally, no wheezing, no crackles, no rhonchi  Abdomen: Soft, non tender, non distended, bowel sounds present, no guarding  Extremities: right arm laceration with bleeding  Data Reviewed: Basic Metabolic Panel:  Recent Labs Lab 10/15/14 1553 10/15/14 1558 10/17/14 0435 10/18/14 0522 10/19/14 0531 10/20/14 0525  NA 134* 132* 128* 128* 126* 131*  K 4.2 4.1 2.9* 3.5 3.0* 4.1  CL 99* 97* 97* 96* 94* 97*  CO2 26  --  25 22 25 28   GLUCOSE 128* 128* 100* 107* 102* 101*  BUN 30* 29* 20 10 14 12   CREATININE 0.84 0.80 0.52 0.58 0.63 0.62  CALCIUM 8.2*  --  7.7* 7.9* 7.7* 8.1*  MG  --   --   --   --   --  1.8   CBC:  Recent Labs Lab 10/16/14 0956 10/17/14 0435 10/18/14 0522 10/19/14 0531 10/20/14 0525  WBC 10.4 9.8 10.6* 11.3* 10.5  HGB 9.8*  9.9* 7.9* 12.2 11.6* 11.8*  HCT 29.0*  28.8* 23.3* 35.2* 33.6* 35.5*  MCV 90.6 90.3 87.6 88.7 90.6  PLT 153 138* 145* 160 181   Scheduled Meds: . amiodarone  400 mg Oral Daily  . atenolol  75 mg Oral BID  . bacitracin   Topical BID  . cefTRIAXone (ROCEPHIN)  IV  1 g Intravenous Q24H  . diltiazem  120 mg Oral Daily  . feeding supplement (ENSURE ENLIVE)  237 mL Oral BID BM  . Influenza vac split quadrivalent PF  0.5 mL Intramuscular Tomorrow-1000  . sodium chloride  3 mL Intravenous Q12H  . sodium chloride  1 g Oral TID WC   Continuous Infusions:

## 2014-10-20 NOTE — Progress Notes (Signed)
Occupational Therapy Treatment Patient Details Name: Jamie Daugherty MRN: [id number] DOB: 09-Nov-1918 Today's Date: 10/20/2014    History of present illness 81 yo female admitted with epistaxis, R arm laceration. Hx of dementia, syncope and collapse. Pt lives alone   OT comments  Pt able to ambulate to bathroom. Still needs min A overall.  Will need snf unless she has 24/7  Follow Up Recommendations  SNF (vs home health with 24/7)    Equipment Recommendations  None recommended by OT    Recommendations for Other Services      Precautions / Restrictions Precautions Precautions: Fall Restrictions Weight Bearing Restrictions: No       Mobility Bed Mobility         Supine to sit: Min guard Sit to supine: Min assist   General bed mobility comments: assist for legs getting back to bed  Transfers     Transfers: Sit to/from Stand Sit to Stand: Min assist         General transfer comment: to steady    Balance             Standing balance-Leahy Scale: Poor                     ADL Overall ADL's : Needs assistance/impaired             Lower Body Bathing: Minimal assistance;Sit to/from stand           Toilet Transfer: Minimal assistance;Ambulation;BSC             General ADL Comments: ambulated to bathroom to complete ADL.  Pt is not used to a Gunner:  min A with this or with hand held assistance.  Cues for thoroughness and to not blow nose, but wipe only.  Returned to bed      Tourist information centre manager                             Extremity/Trunk Assessment               Exercises     Shoulder Instructions       General Comments      Pertinent Vitals/ Pain       Pain Assessment: No/denies pain  Home Living                                          Prior Functioning/Environment              Frequency Min 2X/week     Progress  Toward Goals  OT Goals(current goals can now be found in the care plan section)  Progress towards OT goals: Progressing toward goals  Acute Rehab OT Goals Time For Goal Achievement: 10/25/14  Plan      Co-evaluation                 End of Session     Activity Tolerance Patient tolerated treatment well   Patient Left in bed;with call bell/phone within reach;with bed alarm set;with family/visitor present   Nurse Communication          Time: 0354-6568 OT Time Calculation (min): 20 min  Charges: OT General Charges $OT Visit: 1 Procedure OT Treatments $Self Care/Home Management : 8-22 mins  Jamie Daugherty 10/20/2014, 11:45 AM  Lesle Chris, OTR/L [phone] 10/20/2014

## 2014-10-20 NOTE — Progress Notes (Signed)
PT Cancellation Note  Patient Details Name: Jamie Daugherty MRN: [id number] DOB: Nov 30, 1918   Cancelled Treatment:    Reason Eval/Treat Not Completed: Fatigue/lethargy limiting ability to participate, doesn't recall OT working with her. States that she is tired.   Claretha Cooper 10/20/2014, 3:20 PM Tresa Endo PT [phone]

## 2014-10-20 NOTE — Clinical Social Work Note (Signed)
CSW received a call from case manager stating that pt has been transferred into in patient status.  Dede Query, LCSW [place] Worker - Weekend Coverage cell #: [phone]

## 2014-10-21 LAB — BASIC METABOLIC PANEL
Anion gap: 5 (ref 5–15)
BUN: 16 mg/dL (ref 6–20)
CALCIUM: 7.8 mg/dL — AB (ref 8.9–10.3)
CHLORIDE: 99 mmol/L — AB (ref 101–111)
CO2: 26 mmol/L (ref 22–32)
CREATININE: 0.75 mg/dL (ref 0.44–1.00)
GFR calc Af Amer: >60 mL/min (ref >60)
GFR calc non Af Amer: >60 mL/min (ref >60)
GLUCOSE: 97 mg/dL (ref 65–99)
Potassium: 4.1 mmol/L (ref 3.5–5.1)
Sodium: 130 mmol/L — ABNORMAL LOW (ref 135–145)

## 2014-10-21 LAB — CBC
HEMATOCRIT: 32.9 % — AB (ref 36.0–46.0)
HEMOGLOBIN: 11.1 g/dL — AB (ref 12.0–15.0)
MCH: 30.9 pg (ref 26.0–34.0)
MCHC: 33.7 g/dL (ref 30.0–36.0)
MCV: 91.6 fL (ref 78.0–100.0)
Platelets: 192 10*3/uL (ref 150–400)
RBC: 3.59 MIL/uL — ABNORMAL LOW (ref 3.87–5.11)
RDW: 14.6 % (ref 11.5–15.5)
WBC: 9.7 10*3/uL (ref 4.0–10.5)

## 2014-10-21 MED ORDER — SODIUM CHLORIDE 0.9 % IV SOLN
INTRAVENOUS | Status: AC
  Administered 2014-10-21: 09:00:00 via INTRAVENOUS

## 2014-10-21 NOTE — Progress Notes (Signed)
Foley removed at 1630. Due to void.

## 2014-10-21 NOTE — Progress Notes (Signed)
Patient ID: Jamie Daugherty, female   DOB: 1918/09/17, 81 y.o.   MRN: [id number]  [facility] PROGRESS NOTE  Jamie Daugherty PNT:[id number] DOB: 14-Jan-1919 DOA: 10/15/2014 PCP: Stephens Shire, MD   Brief narrative:    81 y.o. female with known dementia, atrial fibrillation on Xarelto for 3 years, presented to [facility] emergency department with several days duration of persistent nosebleed. Please note that patient was unable to provide any history due to dementia, son at bedside able to provide some details and explained that patient has been complaining of dizziness at home and son called EMS. In emergency department, patient had syncopal event trying to use bedside commode, large amount of bloody stool was noted by staff. Patient sustained right arm laceration post syncopal event and had noticeable amount of bleeding. Per son's report, patient had an appointment scheduled with Dr. Redmond Baseman ENT for evaluation of epistaxis.  Assessment/Plan:    Active Problems:   Acute blood loss anemia - Bleeding from right arm post laceration, epistaxis, blood in stool - Likely secondary to use Xarelto  - Hemoglobin trend since admission: 11.8 --> 9.9 --> 7.9 --> 11  - transfused two units PRBC 9/1 - Continue to hold Xarelto    UTI 9/3 - UA suggestive of UTI, pt with some dysuria and urinary urgency, leukocytosis on blood work  - continue rocephin and follow up on urine cultures     Chronic anticoagulation, on Xarelto - Stopped as noted above, son agrees with permanently discontinuing anticoagulation    Acute thrombocytopenia - also from use of Xarelto, Plt now WNL - no need for Plt transfusion at this time - CBC In AM    Syncopal event - Secondary to acute blood loss anemia in the setting of Xarelto use - Orthostatics positive, secondary to bleeding - Up with assistance only, due to high risk of fall - SNF recommended upon discharge, work in progress    Pacemaker - dual chamber [employer] implanted 2013    Persistent atrial fibrillation, CHADS2 = 5 - Will need to be off anticoagulation as outlined above - Rate controlled with Cardizem and atenolol per home medical regimen - continue amiodarone     Posterior epistaxis - Packing in place, per ENT doctor keep in place and place on Keflex with packing in place      Hypokalemia - supplemented and WNL this AM    Dementia without behavioral disturbance - at baseline     Hyponatremia - pre renal etiology from from acute bleeding - Na improving but still low - add more IVF and repeat BMP in AM    Laceration of right upper arm - thrombi pad in place    Underweight - nutritionist consulted - Body mass index is 16.15  DVT prophylaxis - SCD's  Code Status: Full.  Family Communication:  plan of care discussed with the patient and daughter-in-law at bedside  Disposition Plan: SNF when bed available   IV access:  Peripheral IV  Procedures and diagnostic studies:    No results found.  Medical Consultants:  None  Other Consultants:  Nutritionist   IAnti-Infectives:   Keflex 8/30 --> for as long as she has packing in place transition to Rocephin to cover for UTI 9/4 -->  Faye Ramsay, MD  [facility] Pager [phone]  If 7PM-7AM, please contact night-coverage [email or web address] Password [email or web address] 10/21/2014, 8:13 AM  HPI/Subjective: No events overnight.   Objective: Filed Vitals:   10/20/14 0949 10/20/14 1344 10/20/14 2040 10/21/14 0610  BP:  117/52 119/58 118/63 117/55  Pulse: 71 74 70 72  Temp:  97.9 F (36.6 C) 98.1 F (36.7 C) 98.1 F (36.7 C)  TempSrc:  Oral Oral Oral  Resp:  18 16 12   Height:      Weight:      SpO2:  97% 94% 94%    Intake/Output Summary (Last 24 hours) at 10/21/14 0813 Last data filed at 10/21/14 0707  Gross per 24 hour  Intake    530 ml  Output    600 ml  Net    -70 ml    Exam:   General:  Pt is alert, not in acute distress, intermittently confused    Cardiovascular: Regular rate and rhythm, no rubs, no gallops  Respiratory: Clear to auscultation bilaterally, no wheezing, no crackles, no rhonchi  Abdomen: Soft, non tender, non distended, bowel sounds present, no guarding  Extremities: right arm laceration with bleeding  Data Reviewed: Basic Metabolic Panel:  Recent Labs Lab 10/17/14 0435 10/18/14 0522 10/19/14 0531 10/20/14 0525 10/21/14 0436  NA 128* 128* 126* 131* 130*  K 2.9* 3.5 3.0* 4.1 4.1  CL 97* 96* 94* 97* 99*  CO2 25 22 25 28 26   GLUCOSE 100* 107* 102* 101* 97  BUN 20 10 14 12 16   CREATININE 0.52 0.58 0.63 0.62 0.75  CALCIUM 7.7* 7.9* 7.7* 8.1* 7.8*  MG  --   --   --  1.8  --    CBC:  Recent Labs Lab 10/17/14 0435 10/18/14 0522 10/19/14 0531 10/20/14 0525 10/21/14 0436  WBC 9.8 10.6* 11.3* 10.5 9.7  HGB 7.9* 12.2 11.6* 11.8* 11.1*  HCT 23.3* 35.2* 33.6* 35.5* 32.9*  MCV 90.3 87.6 88.7 90.6 91.6  PLT 138* 145* 160 181 192   Scheduled Meds: . amiodarone  400 mg Oral Daily  . atenolol  75 mg Oral BID  . bacitracin   Topical BID  . cefTRIAXone (ROCEPHIN)  IV  1 g Intravenous Q24H  . diltiazem  120 mg Oral Daily  . feeding supplement (ENSURE ENLIVE)  237 mL Oral BID BM  . Influenza vac split quadrivalent PF  0.5 mL Intramuscular Tomorrow-1000  . sodium chloride  3 mL Intravenous Q12H  . sodium chloride  1 g Oral TID WC   Continuous Infusions:

## 2014-10-21 NOTE — Progress Notes (Signed)
Physical Therapy Treatment Patient Details Name: Jamie Daugherty MRN: [id number] DOB: 1918/12/31 Today's Date: 10/21/2014    History of Present Illness 81 yo female admitted with epistaxis, R arm laceration. Hx of dementia, syncope and collapse. Pt lives alone    PT Comments    Pt reported dizziness in sitting and standing. Supine BP 104/50, sitting 87/56, standing 99/44. Pt tolerated walking 15' with RW and min/guard assist, distance limited by dizziness. Increased activity tolerance today.   Follow Up Recommendations  SNF (if pt discharges home, she will require 24 hour supervision/assist. High fall risk)     Equipment Recommendations  None recommended by PT    Recommendations for Other Services OT consult     Precautions / Restrictions Precautions Precautions: Fall Precaution Comments: high fall risk Restrictions Weight Bearing Restrictions: No    Mobility  Bed Mobility         Supine to sit: Modified independent (Device/Increase time)     General bed mobility comments: used rail, HOB up 25*  Transfers Overall transfer level: Needs assistance Equipment used: Rolling [place] (2 wheeled) Transfers: Sit to/from Stand Sit to Stand: Min guard         General transfer comment: verbal cues for hand placement  Ambulation/Gait Ambulation/Gait assistance: Min guard Ambulation Distance (Feet): 15 Feet Assistive device: Rolling [place] (2 wheeled) Gait Pattern/deviations: Decreased stride length   Gait velocity interpretation: at or above normal speed for age/gender General Gait Details: distance limited by dizziness, no LOB   Stairs            Wheelchair Mobility    Modified Rankin (Stroke Patients Only)       Balance Overall balance assessment: History of Falls;Needs assistance   Sitting balance-Leahy Scale: Good     Standing balance support: Bilateral upper extremity supported Standing balance-Leahy Scale: Poor                       Cognition Arousal/Alertness: Awake/alert Behavior During Therapy: WFL for tasks assessed/performed Overall Cognitive Status: History of cognitive impairments - at baseline       Memory: Decreased short-term memory              Exercises      General Comments        Pertinent Vitals/Pain Pain Assessment: No/denies pain    Home Living                      Prior Function            PT Goals (current goals can now be found in the care plan section) Acute Rehab PT Goals Patient Stated Goal: to be able to walk around and go to the store PT Goal Formulation: With patient Time For Goal Achievement: 11/01/14 Potential to Achieve Goals: Good Progress towards PT goals: Progressing toward goals    Frequency  Min 3X/week    PT Plan Current plan remains appropriate    Co-evaluation             End of Session Equipment Utilized During Treatment: Gait belt Activity Tolerance: Treatment limited secondary to medical complications (Comment) (limited by dizziness) Patient left: in chair;with call bell/phone within reach;with chair alarm set     Time: 5638-7564 PT Time Calculation (min) (ACUTE ONLY): 23 min  Charges:  $Gait Training: 8-22 mins $Therapeutic Activity: 8-22 mins  G Codes:      Blondell Reveal Kistler 10/21/2014, 11:28 AM [phone]

## 2014-10-22 LAB — BASIC METABOLIC PANEL
Anion gap: 8 (ref 5–15)
BUN: 14 mg/dL (ref 6–20)
CHLORIDE: 97 mmol/L — AB (ref 101–111)
CO2: 24 mmol/L (ref 22–32)
Calcium: 7.6 mg/dL — ABNORMAL LOW (ref 8.9–10.3)
Creatinine, Ser: 0.61 mg/dL (ref 0.44–1.00)
GFR calc Af Amer: >60 mL/min (ref >60)
GFR calc non Af Amer: >60 mL/min (ref >60)
Glucose, Bld: 94 mg/dL (ref 65–99)
POTASSIUM: 3.4 mmol/L — AB (ref 3.5–5.1)
SODIUM: 129 mmol/L — AB (ref 135–145)

## 2014-10-22 LAB — CBC
HEMATOCRIT: 30.9 % — AB (ref 36.0–46.0)
HEMOGLOBIN: 10.3 g/dL — AB (ref 12.0–15.0)
MCH: 30.7 pg (ref 26.0–34.0)
MCHC: 33.3 g/dL (ref 30.0–36.0)
MCV: 92.2 fL (ref 78.0–100.0)
Platelets: 204 10*3/uL (ref 150–400)
RBC: 3.35 MIL/uL — AB (ref 3.87–5.11)
RDW: 14.5 % (ref 11.5–15.5)
WBC: 8.4 10*3/uL (ref 4.0–10.5)

## 2014-10-22 MED ORDER — POTASSIUM CHLORIDE CRYS ER 20 MEQ PO TBCR
40.0000 meq | EXTENDED_RELEASE_TABLET | Freq: Once | ORAL | Status: AC
  Administered 2014-10-22: 40 meq via ORAL
  Filled 2014-10-22: qty 2

## 2014-10-22 MED ORDER — CEPHALEXIN 500 MG PO CAPS: 500.0000 mg | ORAL_CAPSULE | Freq: Four times a day (QID) | ORAL | Status: DC

## 2014-10-22 NOTE — Clinical Social Work Placement (Signed)
CSW provided SNF bed offers to patient & son, Alvester Chou - son to review list & let CSW know which facility they decide. Anticipating discharge tomorrow, Wednesday 9/7.     Raynaldo Opitz, [facility] Clinical Social Worker cell #: [phone]    CLINICAL SOCIAL WORK PLACEMENT  NOTE  Date:  10/22/2014  Patient Details  Name: Jamie Daugherty MRN: [id number] Date of Birth: 09-Mar-1918  Clinical Social Work is seeking post-discharge placement for this patient at the [place] level of care (*CSW will initial, date and re-position this form in  chart as items are completed):  Yes   Patient/family provided with [place] Work Department's list of facilities offering this level of care within the geographic area requested by the patient (or if unable, by the patient's family).  Yes   Patient/family informed of their freedom to choose among providers that offer the needed level of care, that participate in Medicare, Medicaid or managed care program needed by the patient, have an available bed and are willing to accept the patient.  Yes   Patient/family informed of [facility]'s ownership interest in [facility] and [facility], as well as of the fact that they are under no obligation to receive care at these facilities.  PASRR submitted to EDS on 10/22/14     PASRR number received on 10/22/14     Existing PASRR number confirmed on       FL2 transmitted to all facilities in geographic area requested by pt/family on 10/22/14     FL2 transmitted to all facilities within larger geographic area on       Patient informed that his/her managed care company has contracts with or will negotiate with certain facilities, including the following:        Yes   Patient/family informed of bed offers received.  Patient chooses bed at       Physician recommends and patient chooses bed at      Patient to be transferred to   on  .  Patient  to be transferred to facility by       Patient family notified on   of transfer.  Name of family member notified:        PHYSICIAN       Additional Comment:    _______________________________________________ Standley Brooking, LCSW 10/22/2014, 12:24 PM

## 2014-10-22 NOTE — Progress Notes (Signed)
Physical Therapy Treatment Patient Details Name: Jamie Daugherty MRN: [id number] DOB: [birth year] Today's Date: 10/22/2014    History of Present Illness 81 yo female admitted with epistaxis, R arm laceration. Hx of dementia, syncope and collapse. Pt lives alone    PT Comments    Assisted pt to EOB when she stated "I'm a fainter". Amb with + 2 assist for safety such that recliner was following but ended up not needing it.  Pt tolerated amb around unit with RW.  Pt still requires ST Rehab at SNF to regain prior level of Independence.  Follow Up Recommendations  SNF     Equipment Recommendations       Recommendations for Other Services       Precautions / Restrictions Precautions Precautions: Fall Precaution Comments: high fall risk.  Pt stated "I'm a fainter" Restrictions Weight Bearing Restrictions: No    Mobility  Bed Mobility Overal bed mobility: Needs Assistance Bed Mobility: Supine to Sit     Supine to sit: Modified independent (Device/Increase time)     General bed mobility comments: used rail, HOB up 25* plus increased time  Transfers Overall transfer level: Needs assistance Equipment used: Rolling [place] (2 wheeled) Transfers: Sit to/from Stand Sit to Stand: Supervision;Min guard         General transfer comment: verbal cues for hand placement and safety with turns  Ambulation/Gait Ambulation/Gait assistance: Min guard Ambulation Distance (Feet): 250 Feet Assistive device: Rolling [place] (2 wheeled) Gait Pattern/deviations: Step-through pattern;Decreased stance time - left;Trunk flexed Gait velocity: WFL   General Gait Details: NO c/o dizziness and NO LOB.  Recliner following for safety but ended up not needing it.     Stairs            Wheelchair Mobility    Modified Rankin (Stroke Patients Only)       Balance                                    Cognition Arousal/Alertness: Awake/alert Behavior During Therapy: WFL for  tasks assessed/performed Overall Cognitive Status: Within Functional Limits for tasks assessed                      Exercises      General Comments        Pertinent Vitals/Pain Pain Assessment: No/denies pain    Home Living                      Prior Function            PT Goals (current goals can now be found in the care plan section) Progress towards PT goals: Progressing toward goals    Frequency  Min 3X/week    PT Plan Current plan remains appropriate    Co-evaluation             End of Session Equipment Utilized During Treatment: Gait belt Activity Tolerance: Patient tolerated treatment well Patient left: in chair;with call bell/phone within reach;with chair alarm set;with family/visitor present     Time: 2330-0762 PT Time Calculation (min) (ACUTE ONLY): 10 min  Charges:  $Gait Training: 8-22 mins                    G Codes:      Rica Koyanagi  PTA [facility]  Acute  Rehab Pager      [phone]

## 2014-10-22 NOTE — Discharge Summary (Signed)
Physician Discharge Summary  Jamie Daugherty LPF:[id number] DOB: January 13, 1919 DOA: 10/15/2014  PCP: Stephens Shire, MD  Admit date: 10/15/2014 Discharge date: 10/23/2014  Recommendations for Outpatient Follow-up:  1. Pt will need to follow up with PCP in 2-3 weeks post discharge 2. Please obtain BMP to evaluate electrolytes and kidney function 3. Please also check CBC to evaluate Hg and Hct levels 4. Please note that Xarelto was stopped due to high risk of bleeding 5. Pt to see Dr. Benjamine Mola to have packing removed 6. Stop Keflex when packing removed   Discharge Diagnoses:  Active Problems:   Chronic anticoagulation, on Xarelto   Pacemaker - dual chamber [employer] implanted 2013   Persistent atrial fibrillation   Posterior epistaxis   Syncope   Dementia without behavioral disturbance   Laceration of right upper arm   Epistaxis   Protein-calorie malnutrition, severe   Anemia due to blood loss, acute   Anemia  Discharge Condition: Stable  Diet recommendation: Heart healthy diet discussed in details     Brief narrative:    81 y.o. female with known dementia, atrial fibrillation on Xarelto for 3 years, presented to [facility] emergency department with several days duration of persistent nosebleed. Please note that patient was unable to provide any history due to dementia, son at bedside able to provide some details and explained that patient has been complaining of dizziness at home and son called EMS. In emergency department, patient had syncopal event trying to use bedside commode, large amount of bloody stool was noted by staff. Patient sustained right arm laceration post syncopal event and had noticeable amount of bleeding. Per son's report, patient had an appointment scheduled with Dr. Redmond Baseman ENT for evaluation of epistaxis.  Assessment/Plan:    Active Problems:  Acute blood loss anemia - Bleeding from right arm post laceration, epistaxis, blood in stool - Likely  secondary to use Xarelto  - Hemoglobin trend since admission: 11.8 --> 9.9 --> 7.9 --> 10 - transfused two units PRBC 9/1 - no plan to resume Xarelto upon discharge    UTI 9/3 - stop Rocpehin 9/7, no need for outpatient ABX    Chronic anticoagulation, on Xarelto - Stopped as noted above, son agrees with permanently discontinuing anticoagulation   Acute thrombocytopenia - also from use of Xarelto, Plt now WNL - no need for Plt transfusion at this time   Syncopal event - Secondary to acute blood loss anemia in the setting of Xarelto use - Orthostatics positive, secondary to bleeding - Up with assistance only, due to high risk of fall - SNF recommended upon discharge, possibly in AM   Pacemaker - dual chamber [employer] implanted 2013   Persistent atrial fibrillation, CHADS2 = 5 - Will need to be off anticoagulation as outlined above - Rate controlled with Cardizem and atenolol per home medical regimen - continue amiodarone    Posterior epistaxis - Packing in place, per ENT doctor keep in place and place on Keflex with packing in place    Hypokalemia - supplemented this AM - will check BMP in AM   Dementia without behavioral disturbance - at baseline    Hyponatremia - pre renal etiology from from acute bleeding - Na improving but still low   Laceration of right upper arm - thrombi pad in place   Underweight with severe PCM - nutritionist consulted, appreciate assistance  - Body mass index is 16.15  DVT prophylaxis - SCD's  Code Status: Full.  Family Communication: plan of  care discussed with the patient and daughter-in-law at bedside  Disposition Plan: SNF in AM   IV access:  Peripheral IV  Procedures and diagnostic studies:    Imaging Results    No results found.    Medical Consultants:  None  Other Consultants:  Nutritionist   IAnti-Infectives:   Keflex 8/30 --> for as long as she has packing in place       Discharge Exam: Filed Vitals:   10/22/14 0432  BP: 127/86  Pulse: 75  Temp: 98.4 F (36.9 C)  Resp: 16   Filed Vitals:   10/21/14 0610 10/21/14 1320 10/21/14 2114 10/22/14 0432  BP: 117/55 118/57 129/53 127/86  Pulse: 72 70 70 75  Temp: 98.1 F (36.7 C) 97.9 F (36.6 C) 98.5 F (36.9 C) 98.4 F (36.9 C)  TempSrc: Oral Oral Oral Oral  Resp: 12 14 12 16   Height:      Weight:      SpO2: 94% 98% 94% 93%    General: Pt is alert, follows commands appropriately, not in acute distress Cardiovascular: Irregular rate and rhythm, no rubs, no gallops Respiratory: Clear to auscultation bilaterally, no wheezing, no crackles, no rhonchi Abdominal: Soft, non tender, non distended, bowel sounds +, no guarding  Discharge Instructions     Medication List    STOP taking these medications        Rivaroxaban 15 MG Tabs tablet  Commonly known as:  XARELTO      TAKE these medications        amiodarone 200 MG tablet  Commonly known as:  PACERONE  Take 1 tablet (200 mg total) by mouth daily.     atenolol 50 MG tablet  Commonly known as:  TENORMIN  Take 1.5 tablets (75 mg total) by mouth 2 (two) times daily.     CARDIZEM LA 120 MG 24 hr tablet  Generic drug:  diltiazem  TAKE 1 TABLET BY MOUTH EVERY DAY     cephALEXin 500 MG capsule  Commonly known as:  KEFLEX  Take 1 capsule (500 mg total) by mouth 4 (four) times daily.            Follow-up Information    Follow up with BURNETT,BRENT A, MD. Schedule an appointment as soon as possible for a visit in 1 week.   Specialty:  Family Medicine   Contact information:   [address] [place] [address] [phone]       Follow up with Ascencion Dike, MD. Schedule an appointment as soon as possible for a visit in 3 days.   Specialty:  Otolaryngology   Why:  Nose bleed   Contact information:   [address] [place] [address] [phone]       Follow up with Stephens Shire, MD.    Specialty:  Family Medicine   Contact information:   [address] [place] [address] [phone]        The results of significant diagnostics from this hospitalization (including imaging, microbiology, ancillary and laboratory) are listed below for reference.     Microbiology: Recent Results (from the past 240 hour(s))  Urine culture     Status: None   Collection Time: 10/19/14 10:20 AM  Result Value Ref Range Status   Specimen Description URINE, CLEAN CATCH  Final   Special Requests NONE  Final   Culture   Final    NO GROWTH 1 DAY Performed at [facility]  [facility]    Report Status 10/20/2014 FINAL  Final     Labs: Basic Metabolic Panel:  Recent Labs Lab 10/18/14 0522 10/19/14 0531 10/20/14 0525 10/21/14 0436 10/22/14 0457  NA 128* 126* 131* 130* 129*  K 3.5 3.0* 4.1 4.1 3.4*  CL 96* 94* 97* 99* 97*  CO2 22 25 28 26 24   GLUCOSE 107* 102* 101* 97 94  BUN 10 14 12 16 14   CREATININE 0.58 0.63 0.62 0.75 0.61  CALCIUM 7.9* 7.7* 8.1* 7.8* 7.6*  MG  --   --  1.8  --   --    Liver Function Tests: No results for input(s): AST, ALT, ALKPHOS, BILITOT, PROT, ALBUMIN in the last 168 hours. No results for input(s): LIPASE, AMYLASE in the last 168 hours. No results for input(s): AMMONIA in the last 168 hours. CBC:  Recent Labs Lab 10/18/14 0522 10/19/14 0531 10/20/14 0525 10/21/14 0436 10/22/14 0457  WBC 10.6* 11.3* 10.5 9.7 8.4  HGB 12.2 11.6* 11.8* 11.1* 10.3*  HCT 35.2* 33.6* 35.5* 32.9* 30.9*  MCV 87.6 88.7 90.6 91.6 92.2  PLT 145* 160 181 192 204    SIGNED: Time coordinating discharge: 30 minutes  MAGICK-MYERS, ISKRA, MD  [facility] 10/22/2014, 11:47 AM Pager [phone]  If 7PM-7AM, please contact night-coverage [email or web address] Password [email or web address]

## 2014-10-22 NOTE — Progress Notes (Signed)
Nutrition Follow-up  DOCUMENTATION CODES:   Severe malnutrition in context of chronic illness, Underweight  INTERVENTION:  - Will d/c Ensure Enlive - Will order Magic cup BID with meals, each supplement provides 290 kcal and 9 grams of protein - RD will continue to monitor for needs  NUTRITION DIAGNOSIS:   Malnutrition related to chronic illness as evidenced by severe depletion of muscle mass, severe depletion of body fat. -ongoing  GOAL:   Patient will meet greater than or equal to 90% of their needs -likely variably met  MONITOR:   Diet advancement, Supplement acceptance, Weight trends, Labs, I & O's  ASSESSMENT:   Patient states she is unsure why she is here today. Per patient's son she developed a nosebleed on Friday. This would occur intermittently over the course of the weekend. He easily resolved with pinching of the nose and patient tipping her head backwards. When patient's no started to bleed this morning family use the above tacticsPatient states she is unsure why she is here today. Per patient's son she developed a nosebleed on Friday. This would occur intermittently over the course of the weekend. He easily resolved with pinching of the nose and patient tipping her head backwards. When patient's no started to bleed this morning family use the above tactics without success. Patient began complaining of dizziness and the son called EMS to bring patient to [place] [facility] ED. The patient had an outpatient appointment set with ENT, Dr. Redmond Baseman, later this week. Patient was started on Xarelto for A. fib 3 years ago. Patient typically very stable on her feet. without success. Patient began complaining of dizziness and the son called EMS to bring patient to [place] [facility] ED. The patient had an outpatient appointment set with ENT, Dr. Redmond Baseman, later this week. Patient was started on Xarelto for A. fib 3 years ago. Patient typically very stable on her feet.  9/6 Diet advancement: 8/30 @ 2016:  CLD 9/1 @ 1054: Regular 9/3: 1200cc fluid restriction added  Pt ate 50% of all meals 9/4 and yesterday consumed 50% breakfast and 75% lunch and dinner. Pt sleeping at time of RD visit. RN reports that pt had told her she did eat some breakfast this AM but RN unsure of percentage of meal eaten. Pt has informed RN that she does not like Ensure Enlive; will d/c this supplement.   Likely minimally meeting needs on average. Medications reviewed. Labs reviewed; Na: 129 mmol/L, K: 3.4 mmol/L, Cl: 97 mmol/L, Ca: 7.6 mg/dL.    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 - Pt denies abdominal pain or nausea at this time.  - She is unable to recall the last time she had something to eat but feels that she typically has a good appetite at home.  - She denies chewing or swallowing difficulties. She states that her weight has been stable recently.  - Per weight hx review, pt has lost 5 lbs (5% body weight) in the past 4 months which is not significant for time frame. Severe muscle and fat wasting noted. - Will order Boost Breeze with CLD diet and change supplement with diet advancement.  Diet Order:  Diet regular Room service appropriate?: Yes; Fluid consistency:: Thin; Fluid restriction:: 1200 mL Fluid  Skin:  Wound (see comment) (R arm wound)  Last BM:  9/5  Height:   Ht Readings from Last 1 Encounters:  10/15/14 5' 6"  (1.676 m)  Weight:   Wt Readings from Last 1 Encounters:  10/15/14 100 lb (45.36 kg)    Ideal Body Weight:  59.09 kg (kg)  BMI:  Body mass index is 16.15 kg/(m^2).  Estimated Nutritional Needs:   Kcal:  0322-0199  Protein:  50-60 grams  Fluid:  1.7-2 L/day  EDUCATION NEEDS:   No education needs identified at this time     Jarome Matin, RD, LDN Inpatient Clinical Dietitian Pager # [phone] After hours/weekend pager # [phone]

## 2014-10-22 NOTE — Care Management Important Message (Signed)
Important Message  Patient Details  Name: Jamie Daugherty MRN: [id number] Date of Birth: January 27, 1919   Medicare Important Message Given:  Yes-second notification given    Purcell Mouton, RN 10/22/2014, 1:05 PM

## 2014-10-22 NOTE — Discharge Instructions (Signed)

## 2014-10-23 LAB — BASIC METABOLIC PANEL
ANION GAP: 7 (ref 5–15)
BUN: 9 mg/dL (ref 6–20)
CALCIUM: 7.8 mg/dL — AB (ref 8.9–10.3)
CO2: 26 mmol/L (ref 22–32)
Chloride: 97 mmol/L — ABNORMAL LOW (ref 101–111)
Creatinine, Ser: 0.5 mg/dL (ref 0.44–1.00)
GFR calc Af Amer: >60 mL/min (ref >60)
GLUCOSE: 99 mg/dL (ref 65–99)
Potassium: 3.3 mmol/L — ABNORMAL LOW (ref 3.5–5.1)
SODIUM: 130 mmol/L — AB (ref 135–145)

## 2014-10-23 LAB — CBC
HCT: 33.8 % — ABNORMAL LOW (ref 36.0–46.0)
Hemoglobin: 11.2 g/dL — ABNORMAL LOW (ref 12.0–15.0)
MCH: 30.3 pg (ref 26.0–34.0)
MCHC: 33.1 g/dL (ref 30.0–36.0)
MCV: 91.4 fL (ref 78.0–100.0)
PLATELETS: 242 10*3/uL (ref 150–400)
RBC: 3.7 MIL/uL — ABNORMAL LOW (ref 3.87–5.11)
RDW: 14.2 % (ref 11.5–15.5)
WBC: 9.3 10*3/uL (ref 4.0–10.5)

## 2014-10-23 MED ORDER — POTASSIUM CHLORIDE CRYS ER 20 MEQ PO TBCR
40.0000 meq | EXTENDED_RELEASE_TABLET | Freq: Once | ORAL | Status: AC
  Administered 2014-10-23: 40 meq via ORAL
  Filled 2014-10-23: qty 2

## 2014-10-23 NOTE — Clinical Social Work Placement (Signed)
Patient is set to discharge to [facility] today. Patient & son, Jamie Daugherty aware. Discharge packet given to RN, Joelene Millin. [facility] called for transport.     Raynaldo Opitz, [facility] Clinical Social Worker cell #: [phone]    CLINICAL SOCIAL WORK PLACEMENT  NOTE  Date:  10/23/2014  Patient Details  Name: Jamie Daugherty MRN: [id number] Date of Birth: [birth year]  Clinical Social Work is seeking post-discharge placement for this patient at the [place] level of care (*CSW will initial, date and re-position this form in  chart as items are completed):  Yes   Patient/family provided with [place] Work Department's list of facilities offering this level of care within the geographic area requested by the patient (or if unable, by the patient's family).  Yes   Patient/family informed of their freedom to choose among providers that offer the needed level of care, that participate in Medicare, Medicaid or managed care program needed by the patient, have an available bed and are willing to accept the patient.  Yes   Patient/family informed of [place]'s ownership interest in [facility] and [facility], as well as of the fact that they are under no obligation to receive care at these facilities.  PASRR submitted to EDS on 10/22/14     PASRR number received on 10/22/14     Existing PASRR number confirmed on       FL2 transmitted to all facilities in geographic area requested by pt/family on 10/22/14     FL2 transmitted to all facilities within larger geographic area on       Patient informed that his/her managed care company has contracts with or will negotiate with certain facilities, including the following:        Yes   Patient/family informed of bed offers received.  Patient chooses bed at [facility]     Physician recommends and patient chooses bed at      Patient to be transferred to [facility] on  10/23/14.  Patient to be transferred to facility by [facility]     Patient family notified on 10/23/14 of transfer.  Name of family member notified:  patient's son, Jamie Daugherty via phone     PHYSICIAN       Additional Comment:    _______________________________________________ Standley Brooking, LCSW 10/23/2014, 10:58 AM

## 2014-10-23 NOTE — Progress Notes (Signed)
Patient d/c'd to SNF, [facility], called report to Sophia at [phone], IV removed, ambulance to transport via stretcher

## 2014-10-23 NOTE — Discharge Summary (Signed)
Physician Discharge Summary  Jamie Daugherty EQA:[id number] DOB: 03/03/1918 DOA: 10/15/2014  PCP: Stephens Shire, MD  Admit date: 10/15/2014 Discharge date: 10/23/2014  Recommendations for Outpatient Follow-up:  1. M.D. at SNF in 3-5 days with repeat labs (CBC & BMP). 2. Pt will need to follow up with PCP in 2-3 weeks post discharge 3. Please note that Xarelto was stopped due to high risk of bleeding  Discharge Diagnoses:  Active Problems:   Chronic anticoagulation, on Xarelto   Pacemaker - dual chamber [employer] implanted 2013   Persistent atrial fibrillation   Posterior epistaxis   Syncope   Dementia without behavioral disturbance   Laceration of right upper arm   Epistaxis   Protein-calorie malnutrition, severe   Anemia due to blood loss, acute   Anemia  Discharge Condition: Stable  Diet recommendation: Heart healthy diet discussed in details     Brief narrative:    81 y.o. female with known dementia, atrial fibrillation on Xarelto for 3 years, presented to [facility] emergency department with several days duration of persistent nosebleed. Please note that patient was unable to provide any history due to dementia, son at bedside able to provide some details and explained that patient has been complaining of dizziness at home and son called EMS. In emergency department, patient had syncopal event trying to use bedside commode, large amount of bloody stool was noted by staff. Patient sustained right arm laceration post syncopal event and had noticeable amount of bleeding. Per son's report, patient had an appointment scheduled with Dr. Redmond Baseman ENT for evaluation of epistaxis.  Assessment/Plan:    Active Problems:  Acute blood loss anemia - Bleeding from right arm post laceration, epistaxis, blood in stool - Likely secondary to use Xarelto  - Hemoglobin trend since admission: 11.8 --> 9.9 --> 7.9 --> 10>11.2  - transfused two units PRBC 9/1 - no plan to resume  Xarelto upon discharge    UTI 9/3 - stop Rocpehin 9/7, no need for outpatient ABX    Chronic anticoagulation, on Xarelto - Stopped as noted above, son agrees with permanently discontinuing anticoagulation   Acute thrombocytopenia - also from use of Xarelto, Plt now WNL - no need for Plt transfusion at this time   Syncopal event - Secondary to acute blood loss anemia in the setting of Xarelto use - Orthostatics positive, secondary to bleeding - Up with assistance only, due to high risk of fall - SNF recommended upon discharge.   Pacemaker - dual chamber [employer] implanted 2013   Persistent atrial fibrillation, CHADS2 = 5 - Will need to be off anticoagulation as outlined above - Rate controlled with Cardizem and atenolol per home medical regimen - continue amiodarone  - Sees Dr. Sallyanne Kuster, Cardiology as outpatient-consider outpatient follow-up with him and decision regarding starting low-dose aspirin for secondary stroke prophylaxis.    Posterior epistaxis, left nostril  - patient had left nasal packing on admission. Discussed with Dr. Benjamine Mola, ENT this morning who came by and removed patient's nasal packing and there was no further bleeding. He recommended discontinuing antibiotics.   Hypokalemia - supplemented this AM - F/U BMP at SNF in a couple of days and may consider scheduled daily potassium supplements if remains persistently low.    Dementia without behavioral disturbance - at baseline    Hyponatremia - pre renal etiology from from acute bleeding. Stable. Outpatient follow-up. - Na improving but still low   Laceration of right upper arm - thrombi pad in place. No  overt bleeding.    Underweight with severe PCM - nutritionist consulted, appreciate assistance  - Body mass index is 16.15  DVT prophylaxis - SCD's  Code Status: Full.  Family Communication: plan of care discussed with the patient and  Son at bedside  Disposition Plan: SNF  today   IV access:  Peripheral IV  Procedures and diagnostic studies:    Imaging Results    No results found.    Medical Consultants:  ENT  Other Consultants:  Nutritionist   IAnti-Infectives:   Keflex 8/30 --> DC'ed on DC      Discharge Exam:  Filed Vitals:   10/22/14 1321 10/22/14 2109 10/23/14 0300 10/23/14 0840  BP: 130/72 142/61 132/67 132/71  Pulse: 70 70 72   Temp: 98 F (36.7 C) 98.2 F (36.8 C) 98.2 F (36.8 C)   TempSrc: Oral Oral Oral   Resp: 20 16 14    Height:      Weight:      SpO2: 96%  94%     General: Pt is alert, follows commands appropriately, not in acute distress. sitting comfortably on chair. Son at bedside.  Cardiovascular: Irregular rate and rhythm, no rubs, no gallops Respiratory: Clear to auscultation bilaterally, no wheezing, no crackles, no rhonchi Abdominal: Soft, non tender, non distended, bowel sounds +, no guarding  CNS: Alert and oriented. No focal deficits.  Discharge Instructions      Discharge Instructions    Call MD for:  extreme fatigue    Complete by:  As directed      Call MD for:  persistant dizziness or light-headedness    Complete by:  As directed      Call MD for:    Complete by:  As directed   Nose bleeding or bleeding elsewhere.     Diet - low sodium heart healthy    Complete by:  As directed      Increase activity slowly    Complete by:  As directed             Medication List    STOP taking these medications        Rivaroxaban 15 MG Tabs tablet  Commonly known as:  XARELTO      TAKE these medications        amiodarone 200 MG tablet  Commonly known as:  PACERONE  Take 1 tablet (200 mg total) by mouth daily.     atenolol 50 MG tablet  Commonly known as:  TENORMIN  Take 1.5 tablets (75 mg total) by mouth 2 (two) times daily.     CARDIZEM LA 120 MG 24 hr tablet  Generic drug:  diltiazem  TAKE 1 TABLET BY MOUTH EVERY DAY        Follow-up Information    Schedule an  appointment as soon as possible for a visit with Stephens Shire, MD.   Specialty:  Family Medicine   Why:  To be seen in follow-up after discharge from SNF.   Contact information:   [address] [place] [address] [phone]       Schedule an appointment as soon as possible for a visit with Sanda Klein, MD.   Specialty:  Cardiology   Why:  Decision to be made regarding starting low-dose aspirin given history of chronic A. fib for secondary stroke prophylaxis. Anticoagulation discontinued secondary to epistaxis and high bleeding risk.   Contact information:   [address] [place] [address] [phone]  The results of significant diagnostics from this hospitalization (including imaging, microbiology, ancillary and laboratory) are listed below for reference.     Microbiology: Recent Results (from the past 240 hour(s))  Urine culture     Status: None   Collection Time: 10/19/14 10:20 AM  Result Value Ref Range Status   Specimen Description URINE, CLEAN CATCH  Final   Special Requests NONE  Final   Culture   Final    NO GROWTH 1 DAY Performed at [facility]    Report Status 10/20/2014 FINAL  Final     Labs: Basic Metabolic Panel:  Recent Labs Lab 10/19/14 0531 10/20/14 0525 10/21/14 0436 10/22/14 0457 10/23/14 0447  NA 126* 131* 130* 129* 130*  K 3.0* 4.1 4.1 3.4* 3.3*  CL 94* 97* 99* 97* 97*  CO2 25 28 26 24 26   GLUCOSE 102* 101* 97 94 99  BUN 14 12 16 14 9   CREATININE 0.63 0.62 0.75 0.61 0.50  CALCIUM 7.7* 8.1* 7.8* 7.6* 7.8*  MG  --  1.8  --   --   --    Liver Function Tests: No results for input(s): AST, ALT, ALKPHOS, BILITOT, PROT, ALBUMIN in the last 168 hours. No results for input(s): LIPASE, AMYLASE in the last 168 hours. No results for input(s): AMMONIA in the last 168 hours. CBC:  Recent Labs Lab 10/19/14 0531 10/20/14 0525 10/21/14 0436 10/22/14 0457 10/23/14 0447  WBC 11.3* 10.5  9.7 8.4 9.3  HGB 11.6* 11.8* 11.1* 10.3* 11.2*  HCT 33.6* 35.5* 32.9* 30.9* 33.8*  MCV 88.7 90.6 91.6 92.2 91.4  PLT 160 181 192 204 242    SIGNED: Time coordinating discharge: 75 minutes  Shanita Kanan, MD  [facility] 10/23/2014, 10:48 AM Pager [phone]  If 7PM-7AM, please contact night-coverage [email or web address] Password [email or web address]

## 2014-10-23 NOTE — Progress Notes (Signed)
Subjective: No more epistaxis this past week  Objective: Vital signs in last 24 hours: Temp:  [98 F (36.7 C)-98.2 F (36.8 C)] 98.2 F (36.8 C) (09/07 0300) Pulse Rate:  [70-72] 72 (09/07 0300) Resp:  [14-20] 14 (09/07 0300) BP: (130-142)/(61-72) 132/71 mmHg (09/07 0840) SpO2:  [94 %-96 %] 94 % (09/07 0300)  Physical Exam  Constitutional: She appears well-developed and well-nourished. No distress. Head: Normocephalic and atraumatic.  Nose: Left merocel packing in place. No active bleeding.  Mouth/Throat: Oropharynx is clear and moist. No pharyngeal blood. Ears: Normal auricles and EACs. Eyes: EOM are normal. Pupils are equal, round, and reactive to light.  Neck: Normal range of motion. Neck supple.  Cardiovascular: Normal rate and regular rhythm.  Pulmonary/Chest: Effort normal and breath sounds normal. No respiratory distress. She has no wheezes. She has no rales.  Skin: She is not diaphoretic.    Recent Labs  10/22/14 0457 10/23/14 0447  WBC 8.4 9.3  HGB 10.3* 11.2*  HCT 30.9* 33.8*  PLT 204 242    Recent Labs  10/22/14 0457 10/23/14 0447  NA 129* 130*  K 3.4* 3.3*  CL 97* 97*  CO2 24 26  GLUCOSE 94 99  BUN 14 9  CREATININE 0.61 0.50  CALCIUM 7.6* 7.8*    Medications:  I have reviewed the patient's current medications. Scheduled: . amiodarone  400 mg Oral Daily  . atenolol  75 mg Oral BID  . bacitracin   Topical BID  . cefTRIAXone (ROCEPHIN)  IV  1 g Intravenous Q24H  . diltiazem  120 mg Oral Daily  . Influenza vac split quadrivalent PF  0.5 mL Intramuscular Tomorrow-1000  . sodium chloride  3 mL Intravenous Q12H  . sodium chloride  1 g Oral TID WC   JXB:[id number] **OR** acetaminophen, ondansetron **OR** ondansetron (ZOFRAN) IV  Assessment/Plan: Pt's left nasal packing is removed without difficulty.  No bleeding. May d/c abx. Ok to start low dose aspirin.    LOS: 4 days   Aleatha Taite,SUI W 10/23/2014, 10:32 AM

## 2014-10-23 NOTE — Progress Notes (Signed)
Patient was interviewed and examined this morning. Her son was at the bedside. She still had left nasal packing in place without bleeding for several days. She denied complaints. As per nursing, no acute issues. Physical exam, unremarkable and stable. Patient was not on telemetry. Discussed with ENT who came by and pulled out her nasal packing and there was no further bleeding. Reviewed discharge summary done by Dr. Doyle Askew on 9/6 and made changes/addendum. Patient will be discharged to SNF today.  Vernell Leep, MD, FACP, FHM. [facility] Pager [phone]  If 7PM-7AM, please contact night-coverage [email or web address] Password [email or web address] 10/23/2014, 10:52 AM

## 2014-10-24 ENCOUNTER — Encounter: Payer: Self-pay | Admitting: Adult Health

## 2014-10-24 ENCOUNTER — Non-Acute Institutional Stay (SKILLED_NURSING_FACILITY): Payer: Medicare Other | Admitting: Adult Health

## 2014-10-24 DIAGNOSIS — E43 Unspecified severe protein-calorie malnutrition: Secondary | ICD-10-CM

## 2014-10-24 DIAGNOSIS — D62 Acute posthemorrhagic anemia: Secondary | ICD-10-CM | POA: Diagnosis not present

## 2014-10-24 DIAGNOSIS — I481 Persistent atrial fibrillation: Secondary | ICD-10-CM | POA: Diagnosis not present

## 2014-10-24 DIAGNOSIS — E876 Hypokalemia: Secondary | ICD-10-CM

## 2014-10-24 DIAGNOSIS — F0390 Unspecified dementia without behavioral disturbance: Secondary | ICD-10-CM

## 2014-10-24 DIAGNOSIS — R5381 Other malaise: Secondary | ICD-10-CM

## 2014-10-24 DIAGNOSIS — S41111S Laceration without foreign body of right upper arm, sequela: Secondary | ICD-10-CM

## 2014-10-24 DIAGNOSIS — I4819 Other persistent atrial fibrillation: Secondary | ICD-10-CM

## 2014-10-24 DIAGNOSIS — R040 Epistaxis: Secondary | ICD-10-CM

## 2014-10-25 ENCOUNTER — Non-Acute Institutional Stay (SKILLED_NURSING_FACILITY): Payer: Medicare Other | Admitting: Internal Medicine

## 2014-10-25 DIAGNOSIS — E46 Unspecified protein-calorie malnutrition: Secondary | ICD-10-CM

## 2014-10-25 DIAGNOSIS — S41111D Laceration without foreign body of right upper arm, subsequent encounter: Secondary | ICD-10-CM

## 2014-10-25 DIAGNOSIS — E871 Hypo-osmolality and hyponatremia: Secondary | ICD-10-CM

## 2014-10-25 DIAGNOSIS — R040 Epistaxis: Secondary | ICD-10-CM

## 2014-10-25 DIAGNOSIS — F0390 Unspecified dementia without behavioral disturbance: Secondary | ICD-10-CM

## 2014-10-25 DIAGNOSIS — E876 Hypokalemia: Secondary | ICD-10-CM | POA: Diagnosis not present

## 2014-10-25 DIAGNOSIS — R5381 Other malaise: Secondary | ICD-10-CM

## 2014-10-25 DIAGNOSIS — D62 Acute posthemorrhagic anemia: Secondary | ICD-10-CM

## 2014-10-25 NOTE — Progress Notes (Signed)
Patient ID: Jamie Daugherty, female   DOB: November 03, 1918, 81 y.o.   MRN: [id number]     [facility]   PCP: Stephens Shire, MD  Code Status: DNR  Allergies  Allergen Reactions  . Penicillins Other (See Comments)    Unknown. Tolerates cephalexin.    Chief Complaint  Patient presents with  . New Admit To SNF     HPI:  81 y.o. patient is here for short term rehabilitation post hospital admission from 10/15/14-10/23/14 with nose bleed and dizziness. In the ED, she had syncopal episode while in the commode and sustained right arm laceration and rectal bleed. She had a hb of 7.9 and received 2 u prbc transfusion. Her xarelto was stopped. She was also treated for UTI with rocephin. She has PMH of dementia, afib. She is seen in her room today with her son present. She has been working with therapy team. She denies any concerns. No further bleed reported.  Review of Systems:  Constitutional: Negative for fever, chills, diaphoresis.  HENT: Negative for headache, congestion, nasal discharge, hearing loss   Eyes: Negative for eye pain, blurred vision, double vision and discharge.  Respiratory: Negative for cough, shortness of breath and wheezing.   Cardiovascular: Negative for chest pain, palpitations, leg swelling.  Gastrointestinal: Negative for heartburn, nausea, vomiting, abdominal pain. Had bowel movement yesterday Genitourinary: Negative for dysuria, urgency, frequency, hematuria, incontinence and flank pain.  Musculoskeletal: Negative for back pain, falls in facility Skin: Negative for itching, rash.  Neurological: Negative for dizziness, tingling, focal weakness Psychiatric/Behavioral: Negative for depression. Has memory loss   Past Medical History  Diagnosis Date  . Hypertension   . Atrial fibrillation   . Syncope   . Pacemaker - dual chamber [facility] implanted 2013 01/25/2013   Past Surgical History  Procedure Laterality Date  .  Pacemaker insertion    . Abdominal hysterectomy     Social History:   reports that she has quit smoking. She does not have any smokeless tobacco history on file. She reports that she drinks alcohol. She reports that she does not use illicit drugs.  Family History  Problem Relation Age of Onset  . Heart disease Mother   . Heart disease Father     Medications:   Medication List       This list is accurate as of: 10/25/14 11:35 AM.  Always use your most recent med list.               amiodarone 200 MG tablet  Commonly known as:  PACERONE  Take 1 tablet (200 mg total) by mouth daily.     atenolol 50 MG tablet  Commonly known as:  TENORMIN  Take 1.5 tablets (75 mg total) by mouth 2 (two) times daily.     CARDIZEM LA 120 MG 24 hr tablet  Generic drug:  diltiazem  TAKE 1 TABLET BY MOUTH EVERY DAY         Physical Exam: Filed Vitals:   10/25/14 1134  BP: 139/73  Pulse: 71  Temp: 97.6 F (36.4 C)  Resp: 18  Weight: 102 lb 3.2 oz (46.358 kg)  SpO2: 97%    General- elderly female, thin built, in no acute distress Head- normocephalic, atraumatic Nose- normal nasal mucosa, no maxillary or frontal sinus tenderness, no nasal discharge Throat- moist mucus membrane Eyes- PERRLA, EOMI, no pallor, no icterus, no discharge, normal conjunctiva, normal sclera Neck- no cervical lymphadenopathy, no jugular vein distension Cardiovascular- normal  s1,s2, no murmurs, palpable dorsalis pedis and radial pulses, no leg edema Respiratory- bilateral clear to auscultation, no wheeze, no rhonchi, no crackles, no use of accessory muscles Abdomen- bowel sounds present, soft, non tender Musculoskeletal- able to move all 4 extremities, generalized weakness  Neurological- no focal deficit, alert and oriented to person and place but no to time Skin- warm and dry, right arm laceration with sutures Psychiatry- normal mood and affect    Labs reviewed: Basic Metabolic Panel:  Recent Labs   10/20/14 0525 10/21/14 0436 10/22/14 0457 10/23/14 0447  NA 131* 130* 129* 130*  K 4.1 4.1 3.4* 3.3*  CL 97* 99* 97* 97*  CO2 28 26 24 26   GLUCOSE 101* 97 94 99  BUN 12 16 14 9   CREATININE 0.62 0.75 0.61 0.50  CALCIUM 8.1* 7.8* 7.6* 7.8*  MG 1.8  --   --   --    Liver Function Tests:  Recent Labs  06/06/14 1513  AST 26  ALT 20  ALKPHOS 58  BILITOT 1.1  PROT 6.9  ALBUMIN 3.8   No results for input(s): LIPASE, AMYLASE in the last 8760 hours. No results for input(s): AMMONIA in the last 8760 hours. CBC:  Recent Labs  10/21/14 0436 10/22/14 0457 10/23/14 0447  WBC 9.7 8.4 9.3  HGB 11.1* 10.3* 11.2*  HCT 32.9* 30.9* 33.8*  MCV 91.6 92.2 91.4  PLT 192 204 242    Assessment/Plan  Generalized weakness Post acute blood loss anemia. Will have her work with physical therapy and occupational therapy team to help with gait training and muscle strengthening exercises.fall precautions. Skin care. Encourage to be out of bed.   Acute blood loss anemia Post bleeding from xarelto, off xarelto and s/p 2 u prbc.no further bleed. Monitor h&h  afib Rate controlled. Continue atenolol 75 mg bid, cardizem 120 mg daily and amiodarone 200 mg daily. Off anticoagulation due to recent bleed  Right arm laceration suture in place and healing well, continue skin care  Epistaxis Resolved post discontinuation of xarelto and nasal packing. Monitor clinically  Hypokalemia Noted on discharge lab, monitor bmp  Hyponatremia Monitor clinically with BMP f/u  Protein calorie malnutrition BMI 17. Continue nutritional supplement, encourage po intake. Monitor weight.   Dementia At baseline, lives at home by herself, son arrnaging for care giver for daytime and pt to be with son and family at night. Assistance with ADLs, fall precautrions, pressure ulcer prophylaxis   Goals of care: short term rehabilitation   Labs/tests ordered: cbc, bmp  Family/ staff Communication: reviewed care plan  with patient and nursing supervisor    Blanchie Serve, MD  [facility] [phone] (Monday-Friday 8 am - 5 pm) [phone] (afterhours)

## 2014-10-25 NOTE — Progress Notes (Signed)
Patient ID: Jamie Daugherty, female   DOB: [birth year], 81 y.o.   MRN: [id number]    DATE:  10/24/14 MRN:  [id number]  BIRTHDAY: Feb 28, 1918  Facility:  [facility] Location:  Jamie Daug[facility]: 106-P  LEVEL OF CARE:  SNF (31)  Contact Information    Name Relation Home Work [place] Son [phone]     Jamie Daugherty Relative [phone]         Chief Complaint  Patient presents with  . Hospitalization Follow-up    Physical deconditioning, anemia, persistent atrial fibrillation, epistaxis, dementia, laceration of right upper arm, hypokalemia and protein calorie malnutrition    HISTORY OF PRESENT ILLNESS:  This is a 81 year old female who has been admitted to [facility] on 10/23/14 from [facility]. She has PMH of dementia and atrial fibrillation on Xarelto for 3 years. She was having several days of persistent epistaxis. She was complaining of dizziness at home. Patient had syncopal episode trying to use bedside commode, large amount of bloody stool was noted by staff. She has sustained right arm laceration post syncopal event and had noticeable amount of bleeding. Her hemoglobin trended from 11.8-7.9. She had blood transfusion of 2 units packed RBC. Xarelto was discontinued.  She has been admitted for a short-term rehabilitation.  PAST MEDICAL HISTORY:  Past Medical History  Diagnosis Date  . Hypertension   . Atrial fibrillation   . Syncope   . Pacemaker - dual chamber [facility] implanted 2013 01/25/2013    CURRENT MEDICATIONS: Reviewed  Patient's Medications  New Prescriptions   No medications on file  Previous Medications   AMIODARONE (PACERONE) 200 MG TABLET    Take 1 tablet (200 mg total) by mouth daily.   ATENOLOL (TENORMIN) 50 MG TABLET    Take 1.5 tablets (75 mg total) by mouth 2 (two) times daily.   CARDIZEM LA 120 MG 24 HR TABLET    TAKE 1 TABLET BY MOUTH EVERY DAY  Modified Medications   No  medications on file  Discontinued Medications   No medications on file     Allergies  Allergen Reactions  . Penicillins Other (See Comments)    Unknown. Tolerates cephalexin.     REVIEW OF SYSTEMS:  GENERAL: no change in appetite, no fatigue, no weight changes, no fever, chills or weakness EYES: Denies change in vision, dry eyes, eye pain, itching or discharge EARS: Denies change in hearing, ringing in ears, or earache NOSE: Denies nasal congestion or epistaxis MOUTH and THROAT: Denies oral discomfort, gingival pain or bleeding, pain from teeth or hoarseness   RESPIRATORY: no cough, SOB, DOE, wheezing, hemoptysis CARDIAC: no chest pain, edema or palpitations GI: no abdominal pain, diarrhea, constipation, heart burn, nausea or vomiting GU: Denies dysuria, frequency, hematuria, incontinence, or discharge PSYCHIATRIC: Denies feeling of depression or anxiety. No report of hallucinations, insomnia, paranoia, or agitation   PHYSICAL EXAMINATION  GENERAL APPEARANCE: Well nourished. In no acute distress. Normal body habitus SKIN:  Laceration on right upper arm with sutures HEAD: Normal in size and contour. No evidence of trauma EYES: Lids open and close normally. No blepharitis, entropion or ectropion. PERRL. Conjunctivae are clear and sclerae are white. Lenses are without opacity EARS: Pinnae are normal. Patient hears normal voice tunes of the examiner MOUTH and THROAT: Lips are without lesions. Oral mucosa is moist and without lesions. Tongue is normal in shape, size, and color and without lesions NECK: supple, trachea midline, no neck  masses, no thyroid tenderness, no thyromegaly LYMPHATICS: no LAN in the neck, no supraclavicular LAN RESPIRATORY: breathing is even & unlabored, BS CTAB CARDIAC: RRR, no murmur,no extra heart sounds, no edema, left chest pacemaker GI: abdomen soft, normal BS, no masses, no tenderness, no hepatomegaly, no splenomegaly EXTREMITIES:  Able to move 4  extremities PSYCHIATRIC: Alert and oriented X 2, disoriented to time Affect and behavior are appropriate  LABS/RADIOLOGY: Labs reviewed: Basic Metabolic Panel:  Recent Labs  10/20/14 0525 10/21/14 0436 10/22/14 0457 10/23/14 0447  NA 131* 130* 129* 130*  K 4.1 4.1 3.4* 3.3*  CL 97* 99* 97* 97*  CO2 28 26 24 26   GLUCOSE 101* 97 94 99  BUN 12 16 14 9   CREATININE 0.62 0.75 0.61 0.50  CALCIUM 8.1* 7.8* 7.6* 7.8*  MG 1.8  --   --   --    Liver Function Tests:  Recent Labs  06/06/14 1513  AST 26  ALT 20  ALKPHOS 58  BILITOT 1.1  PROT 6.9  ALBUMIN 3.8   CBC:  Recent Labs  10/21/14 0436 10/22/14 0457 10/23/14 0447  WBC 9.7 8.4 9.3  HGB 11.1* 10.3* 11.2*  HCT 32.9* 30.9* 33.8*  MCV 91.6 92.2 91.4  PLT 192 204 242    ASSESSMENT/PLAN:  Physical deconditioning - for rehabilitation  Anemia, acute blood loss - hemoglobin 11.2; S/P transfusion of 2 units packed RBC; check CBC   Epistaxis - ENT consulted ; bleeding has stopped  Persistent atrial fibrillation - rate controlled; continue Cardizem LA 120 mg 1 tab by mouth daily, atenolol 75 mg by mouth twice a day  Dementia - baseline  Laceration of right upper arm - disc continue sutures  Protein calorie malnutrition, severe - Body mass index is 17.01 kg/(m^2).  continue supplementation  Hypokalemia - K3.3; check BMP      Goals of care:  Short-term rehabilitation   [facility], NP [place]

## 2014-11-01 ENCOUNTER — Encounter: Payer: Self-pay | Admitting: Adult Health

## 2014-11-01 ENCOUNTER — Non-Acute Institutional Stay (SKILLED_NURSING_FACILITY): Payer: Medicare Other | Admitting: Adult Health

## 2014-11-01 DIAGNOSIS — E43 Unspecified severe protein-calorie malnutrition: Secondary | ICD-10-CM

## 2014-11-01 DIAGNOSIS — E876 Hypokalemia: Secondary | ICD-10-CM

## 2014-11-01 DIAGNOSIS — D62 Acute posthemorrhagic anemia: Secondary | ICD-10-CM

## 2014-11-01 DIAGNOSIS — F0390 Unspecified dementia without behavioral disturbance: Secondary | ICD-10-CM

## 2014-11-01 DIAGNOSIS — I481 Persistent atrial fibrillation: Secondary | ICD-10-CM | POA: Diagnosis not present

## 2014-11-01 DIAGNOSIS — R5381 Other malaise: Secondary | ICD-10-CM | POA: Diagnosis not present

## 2014-11-01 DIAGNOSIS — R040 Epistaxis: Secondary | ICD-10-CM

## 2014-11-01 DIAGNOSIS — S41111D Laceration without foreign body of right upper arm, subsequent encounter: Secondary | ICD-10-CM | POA: Diagnosis not present

## 2014-11-01 DIAGNOSIS — I4819 Other persistent atrial fibrillation: Secondary | ICD-10-CM

## 2014-11-01 NOTE — Progress Notes (Signed)
Patient ID: Jamie Daugherty, female   DOB: Nov 25, 1918, 81 y.o.   MRN: [id number]    DATE:  11/01/14 MRN:  [id number]  BIRTHDAY: 27-Sep-1918  Facility:  [facility] Home Location:  [place] Room Number: 106-P  LEVEL OF CARE:  SNF (31)  Contact Information    Name Relation Home Work [place] Son [phone]     Jeanelle, Dake Relative [phone]         Chief Complaint  Patient presents with  . Discharge Note    Physical deconditioning, anemia, persistent atrial fibrillation, epistaxis, dementia, laceration of right upper arm, hypokalemia and protein calorie malnutrition    HISTORY OF PRESENT ILLNESS:  This is a 81 year old female who is for discharge home with Home health PT and OT and Speech Therapy. DME:  Bedside commode. She has been admitted to [facility] on 10/23/14 from [facility]. She has PMH of dementia and atrial fibrillation on Xarelto for 3 years. She was having several days of persistent epistaxis. She was complaining of dizziness at home. Patient had syncopal episode trying to use bedside commode, large amount of bloody stool was noted by staff. She has sustained right arm laceration post syncopal event and had noticeable amount of bleeding. Her hemoglobin trended from 11.8-7.9. She had blood transfusion of 2 units packed RBC. Xarelto was discontinued.  Patient was admitted to this facility for short-term rehabilitation after the patient's recent hospitalization.  Patient has completed SNF rehabilitation and therapy has cleared the patient for discharge.  PAST MEDICAL HISTORY:  Past Medical History  Diagnosis Date  . Hypertension   . Atrial fibrillation   . Syncope   . Pacemaker - dual chamber [facility] implanted 2013 01/25/2013    CURRENT MEDICATIONS: Reviewed  Patient's Medications  New Prescriptions   No medications on file  Previous Medications   AMIODARONE (PACERONE) 200 MG TABLET    Take 1  tablet (200 mg total) by mouth daily.   ATENOLOL (TENORMIN) 50 MG TABLET    Take 1.5 tablets (75 mg total) by mouth 2 (two) times daily.   CARDIZEM LA 120 MG 24 HR TABLET    TAKE 1 TABLET BY MOUTH EVERY DAY  Modified Medications   No medications on file  Discontinued Medications   No medications on file     Allergies  Allergen Reactions  . Penicillins Other (See Comments)    Unknown. Tolerates cephalexin.     REVIEW OF SYSTEMS:  GENERAL: no change in appetite, no fatigue, no weight changes, no fever, chills or weakness EYES: Denies change in vision, dry eyes, eye pain, itching or discharge EARS: Denies change in hearing, ringing in ears, or earache NOSE: Denies nasal congestion or epistaxis MOUTH and THROAT: Denies oral discomfort, gingival pain or bleeding, pain from teeth or hoarseness   RESPIRATORY: no cough, SOB, DOE, wheezing, hemoptysis CARDIAC: no chest pain, edema or palpitations GI: no abdominal pain, diarrhea, constipation, heart burn, nausea or vomiting GU: Denies dysuria, frequency, hematuria, incontinence, or discharge PSYCHIATRIC: Denies feeling of depression or anxiety. No report of hallucinations, insomnia, paranoia, or agitation   PHYSICAL EXAMINATION  GENERAL APPEARANCE: Well nourished. In no acute distress. Normal body habitus SKIN:  scab on right arm (sutures were removed) HEAD: Normal in size and contour. No evidence of trauma EYES: Lids open and close normally. No blepharitis, entropion or ectropion. PERRL. Conjunctivae are clear and sclerae are white. Lenses are without opacity EARS: Pinnae are normal. Patient  hears normal voice tunes of the examiner MOUTH and THROAT: Lips are without lesions. Oral mucosa is moist and without lesions. Tongue is normal in shape, size, and color and without lesions NECK: supple, trachea midline, no neck masses, no thyroid tenderness, no thyromegaly LYMPHATICS: no LAN in the neck, no supraclavicular LAN RESPIRATORY:  breathing is even & unlabored, BS CTAB CARDIAC: RRR, no murmur,no extra heart sounds, no edema, left chest pacemaker GI: abdomen soft, normal BS, no masses, no tenderness, no hepatomegaly, no splenomegaly EXTREMITIES:  Able to move 4 extremities PSYCHIATRIC: Alert and oriented X 2, disoriented to time Affect and behavior are appropriate  LABS/RADIOLOGY: Labs reviewed: 10/23/14  WBC 10.5 hemoglobin 11.3 hematocrit 34.9 MCV 91.6 platelet 295 sodium 139 potassium 3.7 glucose 84 BUN 14 creatinine 0.75 calcium 8.0 Basic Metabolic Panel:  Recent Labs  10/20/14 0525 10/21/14 0436 10/22/14 0457 10/23/14 0447  NA 131* 130* 129* 130*  K 4.1 4.1 3.4* 3.3*  CL 97* 99* 97* 97*  CO2 28 26 24 26   GLUCOSE 101* 97 94 99  BUN 12 16 14 9   CREATININE 0.62 0.75 0.61 0.50  CALCIUM 8.1* 7.8* 7.6* 7.8*  MG 1.8  --   --   --    Liver Function Tests:  Recent Labs  06/06/14 1513  AST 26  ALT 20  ALKPHOS 58  BILITOT 1.1  PROT 6.9  ALBUMIN 3.8   CBC:  Recent Labs  10/21/14 0436 10/22/14 0457 10/23/14 0447  WBC 9.7 8.4 9.3  HGB 11.1* 10.3* 11.2*  HCT 32.9* 30.9* 33.8*  MCV 91.6 92.2 91.4  PLT 192 204 242    ASSESSMENT/PLAN:  Physical deconditioning - for home health PT, OT and speech therapy  Anemia, acute blood loss - hemoglobin 11.3; S/P transfusion of 2 units packed RBC  Epistaxis - ENT consulted ; bleeding has stopped; no episode while @ [employer]  Persistent atrial fibrillation - rate controlled; continue Cardizem LA 120 mg 1 tab by mouth daily, atenolol 75 mg by mouth twice a day  Dementia - baseline  Laceration of right upper arm - sutures were discontinued  Protein calorie malnutrition, severe - Body mass index is 17.67 kg/(m^2).  continue supplementation  Hypokalemia - K3.7; stable       I have filled out patient's discharge paperwork and written prescriptions.  Patient will receive home health PT, OT and ST.  DME provided:  Bedside commode  Total  discharge time: Greater than 30 minutes  Discharge time involved coordination of the discharge process with social worker, nursing staff and therapy department. Medical justification for home health services/DME verified.    [facility], NP [employer] [phone]

## 2014-11-04 DIAGNOSIS — I10 Essential (primary) hypertension: Secondary | ICD-10-CM

## 2014-11-04 DIAGNOSIS — I481 Persistent atrial fibrillation: Secondary | ICD-10-CM | POA: Diagnosis not present

## 2014-11-04 DIAGNOSIS — D62 Acute posthemorrhagic anemia: Secondary | ICD-10-CM

## 2014-11-04 DIAGNOSIS — R5381 Other malaise: Secondary | ICD-10-CM | POA: Diagnosis not present

## 2014-11-06 ENCOUNTER — Telehealth: Payer: Self-pay | Admitting: Cardiovascular Disease

## 2014-11-06 NOTE — Telephone Encounter (Signed)
PT saw patient in home today. States pt complaining of feeling "a little dizzy" on standing, "always a little dizzy". Noted this sensation was brief in duration. Unsure if patient was lightheaded, dizzy, o/w.  Phys Therapist took sitting and standing BP.  She does note patient's dementia, poor historian of problems. Patient having no other complaints.   Noted 120/80 w HR 74  Sitting, 110/70 standing (no HR obtained) Advised no action for now. Requested to have family call if any concerns or changes. Patient returns to office for pacer check in November. Will see if Dr. Loletha Grayer recommends pt to be seen sooner.

## 2014-11-06 NOTE — Telephone Encounter (Signed)
Stick with original plan.

## 2014-11-12 ENCOUNTER — Encounter: Payer: Self-pay | Admitting: Cardiovascular Disease

## 2014-11-12 ENCOUNTER — Ambulatory Visit (INDEPENDENT_AMBULATORY_CARE_PROVIDER_SITE_OTHER): Payer: Medicare Other | Admitting: Cardiovascular Disease

## 2014-11-12 VITALS — BP 130/74 | HR 70 | Ht 66.0 in | Wt 96.0 lb

## 2014-11-12 DIAGNOSIS — I480 Paroxysmal atrial fibrillation: Secondary | ICD-10-CM | POA: Diagnosis not present

## 2014-11-12 DIAGNOSIS — Z79899 Other long term (current) drug therapy: Secondary | ICD-10-CM | POA: Diagnosis not present

## 2014-11-12 DIAGNOSIS — I5023 Acute on chronic systolic (congestive) heart failure: Secondary | ICD-10-CM | POA: Diagnosis not present

## 2014-11-12 DIAGNOSIS — I4819 Other persistent atrial fibrillation: Secondary | ICD-10-CM

## 2014-11-12 DIAGNOSIS — I481 Persistent atrial fibrillation: Secondary | ICD-10-CM

## 2014-11-12 MED ORDER — ATENOLOL 50 MG PO TABS: 50.0000 mg | ORAL_TABLET | Freq: Every day | ORAL | Status: DC

## 2014-11-12 NOTE — Progress Notes (Signed)
Patient ID: Jamie Daugherty, female   DOB: [birth year], 81 y.o.   MRN: [id number]      Cardiology Office Note   Date:  11/12/2014   ID:  [name], DOB 03-21-1918, MRN [id number]  PCP:  Stephens Shire, MD  Cardiologist:   Sanda Klein, MD   Chief Complaint  Patient presents with  . [place]  . Dizziness    WHEN SHE STANDS  . Fatigue      History of Present Illness: Jamie Daugherty is a 81 y.o. female who presents for follow-up of persistent atrial fibrillation. 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 She continued to have paroxysmal atrial fibrillation with rapid ventricular response, with a marked increase in atrial fibrillation burden in 2016. Rate control was difficult despite the use of relatively high doses of atenolol and diltiazem, resulting in hypotension. After echocardiography showed deterioration in left ventricular systolic function, consistent with probable tachycardia related cardiomyopathy, the decision was made to start antiarrhythmic treatment with amiodarone. This led to better rate control and surprisingly led to restoration of sinus rhythm. Interrogation of her pacemaker now shows uninterrupted maintenance of sinus rhythm for about the last 6 weeks.   Unfortunately, she was hospitalized in early September for an episode of severe epistaxis that lasted for several days, possibly also the cause of subsequent bloody stools. Eventually she even had syncope. She was also bleeding from her right arm after a laceration.Hemoglobin dropped to a minimum of 7.9. She received 2 units of packed red blood cells and by the time of hospital discharge hemoglobin was 11.2. She also underwent nasal packing and her anticoagulant was stopped (Xarelto).  She was already underweight and has lost another 5-6 pounds since her hospitalization. Her BMI is now only 15.5.  Her family describe some  exertional dyspnea, probably NYHA functional class III. She has not had orthopnea, PND or lower showed edema. There have been no focal neurological events. Her appetite has improved following discharge from the hospital.  She has had symptoms of orthostatic hypotension and her primary care physician reduced her atenolol dose to 50 mg twice a day more than a week ago. Despite this, she still has orthostatic dizziness.  Pacemaker interrogation shows normal device function with 31% atrial pacing (up from less than 1% during atrial fibrillation) and 32% ventricular pacing (down from 49% during atrial fibrillation). She has a chronic left bundle branch block.  Past Medical History  Diagnosis Date  . Hypertension   . Atrial fibrillation   . Syncope   . Pacemaker - dual chamber [facility] implanted 2013 01/25/2013    Past Surgical History  Procedure Laterality Date  . Pacemaker insertion    . Abdominal hysterectomy       Current Outpatient Prescriptions  Medication Sig Dispense Refill  . amiodarone (PACERONE) 200 MG tablet Take 1 tablet (200 mg total) by mouth daily. 90 tablet 3  . atenolol (TENORMIN) 50 MG tablet Take 1 tablet (50 mg total) by mouth daily. 90 tablet 3  . CARDIZEM LA 120 MG 24 hr tablet TAKE 1 TABLET BY MOUTH EVERY DAY 30 tablet 11   No current facility-administered medications for this visit.    Allergies:   Penicillins    Social History:  The patient  reports that she has quit smoking. She does not have any smokeless tobacco history on file. She reports that she drinks alcohol. She reports that she does not use illicit  drugs.   Family History:  The patient's family history includes Heart disease in her father and mother.    ROS:  Please see the history of present illness.    Otherwise, review of systems positive for none.  Limited by memory difficulties All other systems are reviewed and negative.    PHYSICAL EXAM: VS:  BP 130/74 mmHg  Pulse 70  Ht 5'  6" (1.676 m)  Wt 96 lb (43.545 kg)  BMI 15.50 kg/m2 , BMI Body mass index is 15.5 kg/(m^2).  General: Alert, oriented x3, no distress Head: no evidence of trauma, PERRL, EOMI, no exophtalmos or lid lag, no myxedema, no xanthelasma; normal ears, nose and oropharynx Neck: normal jugular venous pulsations and no hepatojugular reflux; brisk carotid pulses without delay and no carotid bruits Chest: clear to auscultation, no signs of consolidation by percussion or palpation, normal fremitus, symmetrical and full respiratory excursions Cardiovascular: normal position and quality of the apical impulse, regular rhythm, normal first and second heart sounds, no murmurs, rubs or gallops Abdomen: no tenderness or distention, no masses by palpation, no abnormal pulsatility or arterial bruits, normal bowel sounds, no hepatosplenomegaly Extremities: no clubbing, cyanosis or edema; 2+ radial, ulnar and brachial pulses bilaterally; 2+ right femoral, posterior tibial and dorsalis pedis pulses; 2+ left femoral, posterior tibial and dorsalis pedis pulses; no subclavian or femoral bruits Neurological: grossly nonfocal Psych: euthymic mood, full affect   EKG:  EKG is ordered today. The ekg ordered today demonstrates atrial paced, ventricular sensed, left bundle branch block. QRS 132 ms.   Recent Labs: 06/06/2014: ALT 20 10/20/2014: Magnesium 1.8 10/23/2014: BUN 9; Creatinine, Ser 0.50; Hemoglobin 11.2*; Platelets 242; Potassium 3.3*; Sodium 130*    Lipid Panel No results found for: CHOL, TRIG, HDL, CHOLHDL, VLDL, LDLCALC, LDLDIRECT    Wt Readings from Last 3 Encounters:  11/12/14 96 lb (43.545 kg)  11/01/14 106 lb 3.2 oz (48.172 kg)  10/25/14 102 lb 3.2 oz (46.358 kg)      Other studies Reviewed: Additional studies/ records that were reviewed today include: Records from recent hospitalization.   ASSESSMENT AND PLAN:  1. Persistent atrial fibrillation, converted to sinus rhythm on amiodarone. Plan to  try to reduce the amiodarone dose 100 mg daily if we have sustained maintenance of sinus rhythm over the next couple of months. Discussed need for periodic reevaluation of liver function tests and thyroid function tests, protection from sunlight, risk of drug interactions, risk of ophthalmological or pulmonary complications.  At this point the risk of anticoagulation seems excessive in this extremely frail and elderly lady. I don't think the benefit from aspirin is worth the risk of bleeding even with that agent. We reviewed the fact that there is a substantial risk of embolic stroke but that there is no way to prevent this without increasing the bleeding risk. Nonvalvular AF, normal LV function. CHADSVasc score is 4 (age 9, HTN 1, gender 1; no history of stroke/TIA, DM, CHF, vascular disease).  2. Chronic systolic heart failure, probably due to tachycardia related cardiomyopathy. Hopefully we'll see clinical improvement as well as improvement of left ventricular systolic function with maintenance of sinus rhythm. Plan to repeat an echocardiogram in a couple of months. I don't think we can safely start an ACE inhibitor at this time and would try to avoid polypharmacy in this nonagenarian.  3. Normally functioning dual-chamber permanent pacemaker  4. Chronic left bundle branch block. She is not a great candidate for CRT, although this is an option if her  LV systolic function does not recover with rate/rhythm control.  5. Weight loss is partly age-related, possibly dementia related, clearly in part related to her recent acute illness. Need to check TSH for hyperthyroidism, but the weight loss preceded initiation of amiodarone therapy.   Current medicines are reviewed at length with the patient today.  The patient does not have concerns regarding medicines.  The following changes have been made:  Reduce atenolol to 50 mg daily  Labs/ tests ordered today include:  Orders Placed This Encounter    Procedures  . Comprehensive metabolic panel  . TSH  . EKG 12-Lead  . ECHOCARDIOGRAM COMPLETE     Patient Instructions  Your physician recommends that you return for lab work in: [place].  Your physician has recommended you make the following change in your medication: DECREASE ATENOLOL TO 50MG  DAILY   Your physician has requested that you have an echocardiogram end of November. Echocardiography is a painless test that uses sound waves to create images of your heart. It provides your doctor with information about the size and shape of your heart and how well your heart's chambers and valves are working. This procedure takes approximately one hour. There are no restrictions for this procedure.  Dr. Sallyanne Kuster recommends that you schedule a follow-up appointment in: December FOR ECHO RESULTS AND PACEMAKER CHECK ([employer]).         Mikael Spray, MD  11/12/2014 3:51 PM    Sanda Klein, MD, [facility] [phone] office [phone] pager

## 2014-11-12 NOTE — Patient Instructions (Addendum)
Your physician recommends that you return for lab work in: [place].  Your physician has recommended you make the following change in your medication: DECREASE ATENOLOL TO 50MG  DAILY   Your physician has requested that you have an echocardiogram end of November. Echocardiography is a painless test that uses sound waves to create images of your heart. It provides your doctor with information about the size and shape of your heart and how well your heart's chambers and valves are working. This procedure takes approximately one hour. There are no restrictions for this procedure.  Dr. Sallyanne Kuster recommends that you schedule a follow-up appointment in: December FOR ECHO RESULTS AND PACEMAKER CHECK ([employer]).

## 2014-11-13 LAB — COMPREHENSIVE METABOLIC PANEL
ALBUMIN: 3.3 g/dL — AB (ref 3.6–5.1)
ALK PHOS: 99 U/L (ref 33–130)
ALT: 19 U/L (ref 6–29)
AST: 25 U/L (ref 10–35)
BILIRUBIN TOTAL: 0.5 mg/dL (ref 0.2–1.2)
BUN: 13 mg/dL (ref 7–25)
CALCIUM: 8.3 mg/dL — AB (ref 8.6–10.4)
CO2: 26 mmol/L (ref 20–31)
CREATININE: 0.71 mg/dL (ref 0.60–0.88)
Chloride: 96 mmol/L — ABNORMAL LOW (ref 98–110)
Glucose, Bld: 96 mg/dL (ref 65–99)
Potassium: 4 mmol/L (ref 3.5–5.3)
SODIUM: 130 mmol/L — AB (ref 135–146)
TOTAL PROTEIN: 6.5 g/dL (ref 6.1–8.1)

## 2014-11-14 ENCOUNTER — Telehealth: Payer: Self-pay | Admitting: Cardiovascular Disease

## 2014-11-14 LAB — TSH: TSH: 19.452 u[IU]/mL — AB (ref 0.350–4.500)

## 2014-11-14 NOTE — Telephone Encounter (Signed)
Please recheck O2 sat at next appt. No change in meds

## 2014-11-14 NOTE — Telephone Encounter (Signed)
  [facility] reports today:  Tuesday and today her resting O2 sat was 89% and 90%.  Last week it was 95%.    No shortness of breath with activity.   Seemed fatigued today but has had been busy the last 2 days  BP 110/58 P 72 reg

## 2014-11-15 ENCOUNTER — Telehealth: Payer: Self-pay | Admitting: *Deleted

## 2014-11-15 DIAGNOSIS — E038 Other specified hypothyroidism: Secondary | ICD-10-CM

## 2014-11-15 MED ORDER — LEVOTHYROXINE SODIUM 25 MCG PO TABS: 25.0000 ug | ORAL_TABLET | Freq: Every day | ORAL | Status: DC

## 2014-11-15 NOTE — Telephone Encounter (Signed)
Lab results called to family.  Instructed to start Levothyroxine 25 mcg daily.  Rx sent to pharmacy and lab order mailed to have done week of 12/23/14.  Voiced understanding.  Copy sent to PCP.

## 2014-11-15 NOTE — Telephone Encounter (Signed)
-----   Message from Sanda Klein, MD sent at 11/14/2014  5:45 PM EDT ----- Labs show a underactive thyroid. Please start levothyroxine 25 mcg daily and check TSH and free T4 in 5-6 weeks. Other labs generally OK. Low albumin supports clinical impression that she is malnourished. Copy Dr. Tollie Pizza

## 2014-12-03 LAB — CUP PACEART INCLINIC DEVICE CHECK
Battery Remaining Longevity: 60 mo
Brady Statistic RA Percent Paced: 31 %
Brady Statistic RV Percent Paced: 32 %
Date Time Interrogation Session: 20161018165204
Implantable Lead Implant Date: 20130729
Implantable Lead Implant Date: 20130729
Implantable Lead Location: 753859
Implantable Lead Location: 753860
Implantable Lead Model: 4456
Implantable Lead Model: 4479
Implantable Lead Serial Number: 466820
Implantable Lead Serial Number: 522401
Lead Channel Impedance Value: 410 Ohm
Lead Channel Impedance Value: 702 Ohm
Lead Channel Pacing Threshold Amplitude: 0.8 V
Lead Channel Pacing Threshold Amplitude: 1 V
Lead Channel Pacing Threshold Pulse Width: 0.4 ms
Lead Channel Pacing Threshold Pulse Width: 0.4 ms
Lead Channel Sensing Intrinsic Amplitude: 5.2 mV
Lead Channel Sensing Intrinsic Amplitude: 9 mV
Lead Channel Setting Pacing Amplitude: 2.5 V
Lead Channel Setting Pacing Amplitude: 2.6 V
Lead Channel Setting Pacing Pulse Width: 0.4 ms
Lead Channel Setting Sensing Sensitivity: 1 mV
Pulse Gen Serial Number: 131216

## 2014-12-09 ENCOUNTER — Other Ambulatory Visit: Payer: Self-pay | Admitting: Adult Health

## 2014-12-14 ENCOUNTER — Other Ambulatory Visit (HOSPITAL_COMMUNITY): Payer: Self-pay | Admitting: Nurse Practitioner

## 2014-12-17 ENCOUNTER — Other Ambulatory Visit: Payer: Self-pay | Admitting: Cardiovascular Disease

## 2014-12-17 NOTE — Telephone Encounter (Signed)
Pt requesting a refill on Xarelto 15 mg daily. Medication was D/C in the hosp discharge summary on 10/15/14. Pt has an appt on 12/23/2014. Would you like to refill this medication? Please advise

## 2014-12-19 LAB — TSH: TSH: 18.651 u[IU]/mL — AB (ref 0.350–4.500)

## 2014-12-19 LAB — T4, FREE: Free T4: 0.88 ng/dL (ref 0.80–1.80)

## 2014-12-20 ENCOUNTER — Telehealth: Payer: Self-pay | Admitting: *Deleted

## 2014-12-20 DIAGNOSIS — Z79899 Other long term (current) drug therapy: Secondary | ICD-10-CM

## 2014-12-20 MED ORDER — LEVOTHYROXINE SODIUM 50 MCG PO TABS: 50.0000 ug | ORAL_TABLET | Freq: Every day | ORAL | Status: DC

## 2014-12-20 NOTE — Telephone Encounter (Signed)
-----   Message from Sanda Klein, MD sent at 12/19/2014  5:32 PM EDT ----- Increase levothyroxine to 50 mcg daily and recheck TSH in 6 weeks please. Copy Dr. Tollie Pizza

## 2014-12-20 NOTE — Telephone Encounter (Signed)
Lab results called to [place] and instructed to increase levothyroxine to 50 mcg daily.  Recheck in 6 weeks.  Will pick up request Monday during appt with Dr. Loletha Grayer.  Copy sent to Dr. Tollie Pizza.

## 2014-12-23 ENCOUNTER — Other Ambulatory Visit: Payer: Self-pay | Admitting: Adult Health

## 2014-12-23 ENCOUNTER — Ambulatory Visit (INDEPENDENT_AMBULATORY_CARE_PROVIDER_SITE_OTHER): Payer: Medicare Other | Admitting: Cardiovascular Disease

## 2014-12-23 ENCOUNTER — Encounter: Payer: Self-pay | Admitting: Cardiovascular Disease

## 2014-12-23 VITALS — BP 136/80 | HR 71 | Ht 67.0 in | Wt 98.4 lb

## 2014-12-23 DIAGNOSIS — I5022 Chronic systolic (congestive) heart failure: Secondary | ICD-10-CM

## 2014-12-23 DIAGNOSIS — Z950 Presence of cardiac pacemaker: Secondary | ICD-10-CM | POA: Diagnosis not present

## 2014-12-23 DIAGNOSIS — I480 Paroxysmal atrial fibrillation: Secondary | ICD-10-CM

## 2014-12-23 DIAGNOSIS — E43 Unspecified severe protein-calorie malnutrition: Secondary | ICD-10-CM

## 2014-12-23 LAB — CUP PACEART INCLINIC DEVICE CHECK
Date Time Interrogation Session: 20161114113809
Implantable Lead Location: 753859
Implantable Lead Model: 4479
Lead Channel Impedance Value: 482 Ohm
Lead Channel Pacing Threshold Pulse Width: 0.4 ms
Lead Channel Sensing Intrinsic Amplitude: 5 mV
Lead Channel Sensing Intrinsic Amplitude: 7.8 mV
Lead Channel Setting Pacing Amplitude: 2.6 V
Lead Channel Setting Sensing Sensitivity: 1 mV
MDC IDC LEAD IMPLANT DT: 20130729
MDC IDC LEAD IMPLANT DT: 20130729
MDC IDC LEAD LOCATION: 753860
MDC IDC LEAD SERIAL: 466820
MDC IDC LEAD SERIAL: 522401
MDC IDC MSMT BATTERY REMAINING LONGEVITY: 60 mo
MDC IDC MSMT LEADCHNL RA IMPEDANCE VALUE: 671 Ohm
MDC IDC MSMT LEADCHNL RA PACING THRESHOLD AMPLITUDE: 1 V
MDC IDC MSMT LEADCHNL RV PACING THRESHOLD AMPLITUDE: 1.2 V
MDC IDC MSMT LEADCHNL RV PACING THRESHOLD PULSEWIDTH: 0.4 ms
MDC IDC PG SERIAL: 131216
MDC IDC SET LEADCHNL RV PACING AMPLITUDE: 2.5 V
MDC IDC SET LEADCHNL RV PACING PULSEWIDTH: 0.4 ms
MDC IDC STAT BRADY RA PERCENT PACED: 100 %
MDC IDC STAT BRADY RV PERCENT PACED: 13 %

## 2014-12-23 NOTE — Progress Notes (Signed)
Patient ID: Jamie Daugherty, female   DOB: [birth year], 81 y.o.   MRN: [id number]      Cardiology Office Note   Date:  12/25/2014   ID:  Jamie Daugherty, DOB 1918/06/12, MRN [id number]  PCP:  Jamie Shire, MD  Cardiologist:   Jamie Klein, MD   Chief Complaint  Patient presents with  . Follow-up    3 months:  No complaints of chest pain, SOB, edema.  Infrequent episodes of lightheadedness.      History of Present Illness: Jamie Daugherty is a 81 y.o. female who presents for follow-up for atrial fibrillation with rapid ventricular response complicated by tachycardia related cardiomyopathy. She feels much better than she did in the spring when she was tachycardic. Interrogation of pacemaker shows no atrial fibrillation since September. She has 100% atrial pacing. Rare episodes of paroxysmal atrial tachycardia have been recorded but these are very brief. She also requires only 13% ventricular pacing. Her [employer] dual-chamber device has an estimated generator longevity of about another 5 years. She is on amiodarone therapy.  She was hospitalized with severe epistaxis in early September, required 2 units of red blood cell transfusion and her anticoagulation was stopped. The decision was made to permanently discontinue anticoagulation in view of heard best age, frailty and dangerous bleeding event.  She does not appear to have any side effects from amiodarone so far. She denies chest pain, dyspnea, lower showed edema. She has infrequent episodes of lightheadedness but no syncope. Her daughter Jamie Daugherty agrees that she has had a marked improvement compared with a few months ago.   Past Medical History  Diagnosis Date  . Hypertension   . Atrial fibrillation ([place])   . Syncope   . Pacemaker - dual chamber [facility] implanted 2013 01/25/2013    Past Surgical History  Procedure Laterality Date  . Pacemaker insertion    . Abdominal hysterectomy       Current  Outpatient Prescriptions  Medication Sig Dispense Refill  . amiodarone (PACERONE) 200 MG tablet Take 1 tablet (200 mg total) by mouth daily. 90 tablet 3  . atenolol (TENORMIN) 50 MG tablet Take 1 tablet (50 mg total) by mouth daily. 90 tablet 3  . CARDIZEM LA 120 MG 24 hr tablet TAKE 1 TABLET BY MOUTH EVERY DAY 30 tablet 11  . levothyroxine (SYNTHROID, LEVOTHROID) 50 MCG tablet Take 1 tablet (50 mcg total) by mouth daily before breakfast. 90 tablet 3   No current facility-administered medications for this visit.    Allergies:   Penicillins    Social History:  The patient  reports that she has quit smoking. She does not have any smokeless tobacco history on file. She reports that she drinks alcohol. She reports that she does not use illicit drugs.   Family History:  The patient's family history includes Heart disease in her father and mother.    ROS:  Please see the history of present illness.    Otherwise, review of systems positive for none.   All other systems are reviewed and negative.    PHYSICAL EXAM: VS:  BP 136/80 mmHg  Pulse 71  Ht 5\' 7"  (1.702 m)  Wt 98 lb 6.4 oz (44.634 kg)  BMI 15.41 kg/m2 , BMI Body mass index is 15.41 kg/(m^2).  General: Alert, oriented x3, no distress, very thin and frail appearing Head: no evidence of trauma, PERRL, EOMI, no exophtalmos or lid lag, no myxedema, no xanthelasma; normal ears, nose and oropharynx Neck: normal jugular venous pulsations  and no hepatojugular reflux; brisk carotid pulses without delay and no carotid bruits Chest: clear to auscultation, no signs of consolidation by percussion or palpation, normal fremitus, symmetrical and full respiratory excursions, healthy pacemaker site Cardiovascular: normal position and quality of the apical impulse, regular rhythm, normal first and second heart sounds, no murmurs, rubs or gallops Abdomen: no tenderness or distention, no masses by palpation, no abnormal pulsatility or arterial bruits,  normal bowel sounds, no hepatosplenomegaly Extremities: no clubbing, cyanosis or edema; 2+ radial, ulnar and brachial pulses bilaterally; 2+ right femoral, posterior tibial and dorsalis pedis pulses; 2+ left femoral, posterior tibial and dorsalis pedis pulses; no subclavian or femoral bruits Neurological: grossly nonfocal Psych: euthymic mood, full affect   EKG:  EKG is not ordered today. The ekg ordered today demonstrates    Recent Labs: 10/20/2014: Magnesium 1.8 10/23/2014: Hemoglobin 11.2*; Platelets 242 11/12/2014: ALT 19; BUN 13; Creat 0.71; Potassium 4.0; Sodium 130* 12/18/2014: TSH 18.651*   More recent labs show sodium 132, creatinine 0.8, potassium 4.1, hemoglobin 12.2    Wt Readings from Last 3 Encounters:  12/23/14 98 lb 6.4 oz (44.634 kg)  11/12/14 96 lb (43.545 kg)  11/01/14 106 lb 3.2 oz (48.172 kg)     ASSESSMENT AND PLAN:  1. History of persistent atrial fibrillation, now in sinus rhythm on amiodarone. Despite high stroke risk, she has also proven to be at very high bleeding risk and is not receiving any anticoagulation. She is maintaining atrial paced rhythm for the last couple of months with only very brief episodes of ectopic atrial tachycardia. Plan to check liver function tests and thyroid function tests every 6 months. Also discussed the need to avoid prolonged sun exposure and a yearly ophthalmological exam. For the time being continue atenolol and diltiazem for possible breakthrough atrial fibrillation rate control. Ideally we'll try to streamline her medications at a future appointment. CHADSVasc 5.  2. Normally functioning dual-chamber permanent pacemaker  3. Tachycardia related cardiomyopathy and chronic systolic heart failure with improved symptoms, suspect on a follow-up echocardiogram we will see a marked improvement in left ventricular systolic function compared with earlier this year.  4. Hypothyroidism on hormone replacement therapy. After recent  adjustment in her thyroxine dose, her TSH is still quite high. Will recheck TSH in a month  5. Severe malnutrition/underweight  6. Age-related dementia  7. Chronic left bundle branch block, she is probably not the optimal candidate for CRT    Current medicines are reviewed at length with the patient today.  The patient does not have concerns regarding medicines.  The following changes have been made:  no change  Labs/ tests ordered today include:  Orders Placed This Encounter  Procedures  . EKG 12-Lead     Patient Instructions  Remote monitoring is used to monitor your Pacemaker of ICD from home. This monitoring reduces the number of office visits required to check your device to one time per year. It allows [place] to keep an eye on the functioning of your device to ensure it is working properly. You are scheduled for a device check from home on March 25, 2014. You may send your transmission at any time that day. If you have a wireless device, the transmission will be sent automatically. After your physician reviews your transmission, you will receive a postcard with your next transmission date.  Your physician wants you to follow-up in: 6 months with Dr. Sallyanne Kuster - device check in office. You will receive a reminder letter in the mail  two months in advance. If you don't receive a letter, please call our office to schedule the follow-up appointment.      Mikael Spray, MD  12/25/2014 5:04 PM    Jamie Klein, MD, [facility] [phone] office [phone] pager

## 2014-12-23 NOTE — Patient Instructions (Signed)
Remote monitoring is used to monitor your Pacemaker of ICD from home. This monitoring reduces the number of office visits required to check your device to one time per year. It allows [place] to keep an eye on the functioning of your device to ensure it is working properly. You are scheduled for a device check from home on March 25, 2014. You may send your transmission at any time that day. If you have a wireless device, the transmission will be sent automatically. After your physician reviews your transmission, you will receive a postcard with your next transmission date.  Your physician wants you to follow-up in: 6 months with Dr. Sallyanne Kuster - device check in office. You will receive a reminder letter in the mail two months in advance. If you don't receive a letter, please call our office to schedule the follow-up appointment.

## 2014-12-25 ENCOUNTER — Other Ambulatory Visit: Payer: Self-pay | Admitting: Adult Health

## 2014-12-25 DIAGNOSIS — I5022 Chronic systolic (congestive) heart failure: Secondary | ICD-10-CM | POA: Insufficient documentation

## 2014-12-26 ENCOUNTER — Other Ambulatory Visit: Payer: Self-pay

## 2014-12-26 ENCOUNTER — Telehealth: Payer: Self-pay | Admitting: Cardiovascular Disease

## 2014-12-26 MED ORDER — DILTIAZEM HCL ER COATED BEADS 120 MG PO TB24: 120.0000 mg | ORAL_TABLET | Freq: Every day | ORAL | Status: DC

## 2014-12-26 NOTE — Telephone Encounter (Signed)
°*  STAT* If patient is at the pharmacy, call can be transferred to refill team.   1. Which medications need to be refilled? (please list name of each medication and dose if known) Cardizem-please call today,she have been waiting. Pt is completely out of her medicine  2. Which pharmacy/location (including street and city if local pharmacy) is medication to be sent to?[facility]  3. Do they need a 30 day or 90 day supply? 90 and refills

## 2014-12-26 NOTE — Telephone Encounter (Signed)
Called the pharmacy and spoke with Clair Gulling, pharmacy tech and gave a verbal order for Cardizem dispensing 90 tablets with 2 refills. I also called the pt and left a message informing her that she has refills left on this medication and she just need to call her pharmacy for a refills. I advised Clair Gulling, pharmacy tech, that if he or the pt has any other problems, questions or concerns to call the office. Clair Gulling verbalized understanding.

## 2015-01-03 ENCOUNTER — Other Ambulatory Visit: Payer: Self-pay

## 2015-01-03 ENCOUNTER — Ambulatory Visit (HOSPITAL_COMMUNITY): Payer: Medicare Other | Attending: Cardiology

## 2015-01-03 ENCOUNTER — Other Ambulatory Visit: Payer: Self-pay | Admitting: Cardiovascular Disease

## 2015-01-03 DIAGNOSIS — I5023 Acute on chronic systolic (congestive) heart failure: Secondary | ICD-10-CM | POA: Diagnosis not present

## 2015-01-03 DIAGNOSIS — I059 Rheumatic mitral valve disease, unspecified: Secondary | ICD-10-CM | POA: Insufficient documentation

## 2015-01-03 DIAGNOSIS — I509 Heart failure, unspecified: Secondary | ICD-10-CM | POA: Diagnosis present

## 2015-01-03 DIAGNOSIS — I10 Essential (primary) hypertension: Secondary | ICD-10-CM | POA: Diagnosis not present

## 2015-01-07 ENCOUNTER — Telehealth: Payer: Self-pay | Admitting: Cardiovascular Disease

## 2015-01-07 NOTE — Telephone Encounter (Signed)
Returning your call. °

## 2015-01-08 NOTE — Telephone Encounter (Signed)
Echo results given to patient 01/07/15.

## 2015-02-04 ENCOUNTER — Encounter: Payer: Medicare Other | Admitting: Cardiovascular Disease

## 2015-02-15 LAB — TSH: TSH: 7.212 u[IU]/mL — ABNORMAL HIGH (ref 0.350–4.500)

## 2015-02-18 ENCOUNTER — Telehealth: Payer: Self-pay | Admitting: Cardiovascular Disease

## 2015-02-18 DIAGNOSIS — E038 Other specified hypothyroidism: Secondary | ICD-10-CM

## 2015-02-18 MED ORDER — LEVOTHYROXINE SODIUM 75 MCG PO TABS: 75.0000 ug | ORAL_TABLET | Freq: Every day | ORAL | Status: DC

## 2015-02-18 NOTE — Telephone Encounter (Signed)
Returning your call. °

## 2015-02-18 NOTE — Telephone Encounter (Signed)
TSH results called to patient/daughter in law.  Will increase levothyroxine to 75 mcg daily and recheck in 6 weeks.  Order mailed to patient.

## 2015-02-18 NOTE — Telephone Encounter (Signed)
-----   Message from Sanda Klein, MD sent at 02/18/2015 12:37 PM EST ----- Improved TSH, almost at target. Please increase levothyroxine to 75 mcg daily and recheck in 6 weeks.

## 2015-02-20 ENCOUNTER — Telehealth: Payer: Self-pay | Admitting: Cardiovascular Disease

## 2015-02-20 NOTE — Telephone Encounter (Signed)
°  1. Has your device fired? no ° °2. Is you device beeping? no ° °3. Are you experiencing draining or swelling at device site? no ° °4. Are you calling to see if we received your device transmission? yes ° °5. Have you passed out? no ° °

## 2015-02-21 ENCOUNTER — Telehealth: Payer: Self-pay | Admitting: Cardiology

## 2015-02-21 NOTE — Telephone Encounter (Signed)
Follow up      Calling to get the results from the remote transmission

## 2015-02-21 NOTE — Telephone Encounter (Signed)
LMOM advising that transmission was successfully received and that next remote transmission is due on 03/26/15.  Gave device clinic phone number if she has any questions or concerns.

## 2015-02-21 NOTE — Telephone Encounter (Signed)
Pt daughter in law called back wanting results to remote transmission that was received today. Informed her that the device tech was with another pt and would call her back. Pt daughter in law verbalized understanding.

## 2015-02-21 NOTE — Telephone Encounter (Signed)
Informed EC that remote was received. No episodes recorded. Presenting rhythm is ApVs.   EC voiced understanding.

## 2015-03-26 ENCOUNTER — Ambulatory Visit (INDEPENDENT_AMBULATORY_CARE_PROVIDER_SITE_OTHER): Payer: Medicare Other | Admitting: *Deleted

## 2015-03-26 DIAGNOSIS — Z950 Presence of cardiac pacemaker: Secondary | ICD-10-CM | POA: Diagnosis not present

## 2015-03-26 DIAGNOSIS — I480 Paroxysmal atrial fibrillation: Secondary | ICD-10-CM

## 2015-03-26 LAB — CUP PACEART REMOTE DEVICE CHECK
Battery Remaining Longevity: 54 mo
Date Time Interrogation Session: 20170314131845
Implantable Lead Implant Date: 20130729
Implantable Lead Location: 753859
Implantable Lead Serial Number: 466820
Lead Channel Setting Pacing Amplitude: 2.5 V
Lead Channel Setting Pacing Amplitude: 2.6 V
Lead Channel Setting Pacing Pulse Width: 0.4 ms
Lead Channel Setting Sensing Sensitivity: 1 mV
MDC IDC LEAD IMPLANT DT: 20130729
MDC IDC LEAD LOCATION: 753860
MDC IDC LEAD MODEL: 4479
MDC IDC LEAD SERIAL: 522401
MDC IDC MSMT LEADCHNL RA IMPEDANCE VALUE: 659 Ohm
MDC IDC MSMT LEADCHNL RV IMPEDANCE VALUE: 408 Ohm
MDC IDC MSMT LEADCHNL RV PACING THRESHOLD AMPLITUDE: 0.9 V
MDC IDC MSMT LEADCHNL RV PACING THRESHOLD PULSEWIDTH: 0.4 ms
MDC IDC MSMT LEADCHNL RV SENSING INTR AMPL: 11.7 mV
Pulse Gen Serial Number: 131216

## 2015-03-26 NOTE — Progress Notes (Signed)
Remote pacemaker transmission.   

## 2015-04-04 DIAGNOSIS — E038 Other specified hypothyroidism: Secondary | ICD-10-CM | POA: Diagnosis not present

## 2015-04-05 LAB — TSH: TSH: 3.55 m[IU]/L

## 2015-04-16 ENCOUNTER — Emergency Department (HOSPITAL_COMMUNITY): Payer: Medicare Other

## 2015-04-16 ENCOUNTER — Inpatient Hospital Stay (HOSPITAL_COMMUNITY)
Admission: EM | Admit: 2015-04-16 | Discharge: 2015-04-23 | DRG: 470 | Disposition: A | Discharge status: Skilled Nursing Facility | Payer: Medicare Other | Attending: Internal Medicine | Admitting: Internal Medicine

## 2015-04-16 ENCOUNTER — Encounter (HOSPITAL_COMMUNITY): Payer: Self-pay | Admitting: *Deleted

## 2015-04-16 DIAGNOSIS — S728X9A Other fracture of unspecified femur, initial encounter for closed fracture: Secondary | ICD-10-CM | POA: Diagnosis not present

## 2015-04-16 DIAGNOSIS — W010XXA Fall on same level from slipping, tripping and stumbling without subsequent striking against object, initial encounter: Secondary | ICD-10-CM | POA: Diagnosis present

## 2015-04-16 DIAGNOSIS — S72012A Unspecified intracapsular fracture of left femur, initial encounter for closed fracture: Principal | ICD-10-CM | POA: Diagnosis present

## 2015-04-16 DIAGNOSIS — F0390 Unspecified dementia without behavioral disturbance: Secondary | ICD-10-CM | POA: Diagnosis not present

## 2015-04-16 DIAGNOSIS — S72009A Fracture of unspecified part of neck of unspecified femur, initial encounter for closed fracture: Secondary | ICD-10-CM | POA: Diagnosis present

## 2015-04-16 DIAGNOSIS — I5022 Chronic systolic (congestive) heart failure: Secondary | ICD-10-CM | POA: Diagnosis not present

## 2015-04-16 DIAGNOSIS — D62 Acute posthemorrhagic anemia: Secondary | ICD-10-CM | POA: Diagnosis not present

## 2015-04-16 DIAGNOSIS — Z8249 Family history of ischemic heart disease and other diseases of the circulatory system: Secondary | ICD-10-CM | POA: Diagnosis not present

## 2015-04-16 DIAGNOSIS — Z4732 Aftercare following explantation of hip joint prosthesis: Secondary | ICD-10-CM | POA: Diagnosis not present

## 2015-04-16 DIAGNOSIS — S72001A Fracture of unspecified part of neck of right femur, initial encounter for closed fracture: Secondary | ICD-10-CM | POA: Diagnosis not present

## 2015-04-16 DIAGNOSIS — Z880 Allergy status to penicillin: Secondary | ICD-10-CM

## 2015-04-16 DIAGNOSIS — I5020 Unspecified systolic (congestive) heart failure: Secondary | ICD-10-CM

## 2015-04-16 DIAGNOSIS — S72002A Fracture of unspecified part of neck of left femur, initial encounter for closed fracture: Secondary | ICD-10-CM

## 2015-04-16 DIAGNOSIS — S72012D Unspecified intracapsular fracture of left femur, subsequent encounter for closed fracture with routine healing: Secondary | ICD-10-CM | POA: Diagnosis not present

## 2015-04-16 DIAGNOSIS — Z9181 History of falling: Secondary | ICD-10-CM | POA: Diagnosis not present

## 2015-04-16 DIAGNOSIS — R0902 Hypoxemia: Secondary | ICD-10-CM | POA: Diagnosis not present

## 2015-04-16 DIAGNOSIS — E039 Hypothyroidism, unspecified: Secondary | ICD-10-CM | POA: Diagnosis not present

## 2015-04-16 DIAGNOSIS — Z950 Presence of cardiac pacemaker: Secondary | ICD-10-CM

## 2015-04-16 DIAGNOSIS — I951 Orthostatic hypotension: Secondary | ICD-10-CM | POA: Diagnosis not present

## 2015-04-16 DIAGNOSIS — E871 Hypo-osmolality and hyponatremia: Secondary | ICD-10-CM | POA: Diagnosis not present

## 2015-04-16 DIAGNOSIS — I10 Essential (primary) hypertension: Secondary | ICD-10-CM | POA: Diagnosis not present

## 2015-04-16 DIAGNOSIS — W19XXXA Unspecified fall, initial encounter: Secondary | ICD-10-CM | POA: Diagnosis present

## 2015-04-16 DIAGNOSIS — E876 Hypokalemia: Secondary | ICD-10-CM | POA: Diagnosis present

## 2015-04-16 DIAGNOSIS — W19XXXD Unspecified fall, subsequent encounter: Secondary | ICD-10-CM | POA: Diagnosis not present

## 2015-04-16 DIAGNOSIS — M6281 Muscle weakness (generalized): Secondary | ICD-10-CM | POA: Diagnosis not present

## 2015-04-16 DIAGNOSIS — Z9660 Presence of unspecified orthopedic joint implant: Secondary | ICD-10-CM | POA: Diagnosis not present

## 2015-04-16 DIAGNOSIS — S72042A Displaced fracture of base of neck of left femur, initial encounter for closed fracture: Secondary | ICD-10-CM | POA: Diagnosis not present

## 2015-04-16 DIAGNOSIS — S7292XA Unspecified fracture of left femur, initial encounter for closed fracture: Secondary | ICD-10-CM | POA: Diagnosis not present

## 2015-04-16 DIAGNOSIS — R41841 Cognitive communication deficit: Secondary | ICD-10-CM | POA: Diagnosis not present

## 2015-04-16 DIAGNOSIS — R2689 Other abnormalities of gait and mobility: Secondary | ICD-10-CM | POA: Diagnosis not present

## 2015-04-16 DIAGNOSIS — K5900 Constipation, unspecified: Secondary | ICD-10-CM | POA: Diagnosis present

## 2015-04-16 DIAGNOSIS — G8929 Other chronic pain: Secondary | ICD-10-CM | POA: Diagnosis not present

## 2015-04-16 DIAGNOSIS — Z87891 Personal history of nicotine dependence: Secondary | ICD-10-CM | POA: Diagnosis not present

## 2015-04-16 DIAGNOSIS — Z79899 Other long term (current) drug therapy: Secondary | ICD-10-CM

## 2015-04-16 DIAGNOSIS — Z96649 Presence of unspecified artificial hip joint: Secondary | ICD-10-CM | POA: Insufficient documentation

## 2015-04-16 DIAGNOSIS — Z96642 Presence of left artificial hip joint: Secondary | ICD-10-CM | POA: Insufficient documentation

## 2015-04-16 DIAGNOSIS — Z471 Aftercare following joint replacement surgery: Secondary | ICD-10-CM | POA: Diagnosis not present

## 2015-04-16 DIAGNOSIS — I4891 Unspecified atrial fibrillation: Secondary | ICD-10-CM | POA: Diagnosis not present

## 2015-04-16 DIAGNOSIS — Z419 Encounter for procedure for purposes other than remedying health state, unspecified: Secondary | ICD-10-CM | POA: Diagnosis not present

## 2015-04-16 DIAGNOSIS — I480 Paroxysmal atrial fibrillation: Secondary | ICD-10-CM | POA: Diagnosis not present

## 2015-04-16 DIAGNOSIS — R278 Other lack of coordination: Secondary | ICD-10-CM | POA: Diagnosis not present

## 2015-04-16 DIAGNOSIS — S72002D Fracture of unspecified part of neck of left femur, subsequent encounter for closed fracture with routine healing: Secondary | ICD-10-CM | POA: Diagnosis not present

## 2015-04-16 LAB — BASIC METABOLIC PANEL
ANION GAP: 13 (ref 5–15)
BUN: 14 mg/dL (ref 6–20)
CHLORIDE: 98 mmol/L — AB (ref 101–111)
CO2: 25 mmol/L (ref 22–32)
Calcium: 9.3 mg/dL (ref 8.9–10.3)
Creatinine, Ser: 0.81 mg/dL (ref 0.44–1.00)
GFR calc Af Amer: >60 mL/min (ref >60)
GLUCOSE: 113 mg/dL — AB (ref 65–99)
POTASSIUM: 3.7 mmol/L (ref 3.5–5.1)
Sodium: 136 mmol/L (ref 135–145)

## 2015-04-16 LAB — CBC WITH DIFFERENTIAL/PLATELET
BASOS ABS: 0 10*3/uL (ref 0.0–0.1)
Basophils Relative: 0 %
Eosinophils Absolute: 0.1 10*3/uL (ref 0.0–0.7)
Eosinophils Relative: 1 %
HEMATOCRIT: 39.1 % (ref 36.0–46.0)
Hemoglobin: 12.7 g/dL (ref 12.0–15.0)
LYMPHS ABS: 1.1 10*3/uL (ref 0.7–4.0)
LYMPHS PCT: 11 %
MCH: 28.9 pg (ref 26.0–34.0)
MCHC: 32.5 g/dL (ref 30.0–36.0)
MCV: 88.9 fL (ref 78.0–100.0)
MONO ABS: 0.7 10*3/uL (ref 0.1–1.0)
Monocytes Relative: 7 %
NEUTROS ABS: 8.2 10*3/uL — AB (ref 1.7–7.7)
Neutrophils Relative %: 81 %
PLATELETS: 223 10*3/uL (ref 150–400)
RBC: 4.4 MIL/uL (ref 3.87–5.11)
RDW: 14.9 % (ref 11.5–15.5)
WBC: 10 10*3/uL (ref 4.0–10.5)

## 2015-04-16 LAB — PROTIME-INR
INR: 1.04 (ref 0.00–1.49)
Prothrombin Time: 13.8 seconds (ref 11.6–15.2)

## 2015-04-16 LAB — TYPE AND SCREEN
ABO/RH(D): A POS
ANTIBODY SCREEN: NEGATIVE

## 2015-04-16 LAB — ABO/RH: ABO/RH(D): A POS

## 2015-04-16 MED ORDER — ATENOLOL 50 MG PO TABS
50.0000 mg | ORAL_TABLET | Freq: Every day | ORAL | Status: DC
  Administered 2015-04-17 – 2015-04-23 (×7): 50 mg via ORAL
  Filled 2015-04-16 (×8): qty 1

## 2015-04-16 MED ORDER — ALUM & MAG HYDROXIDE-SIMETH 200-200-20 MG/5ML PO SUSP: 30.0000 mL | Freq: Four times a day (QID) | ORAL | Status: DC | PRN

## 2015-04-16 MED ORDER — MORPHINE SULFATE (PF) 4 MG/ML IV SOLN
4.0000 mg | INTRAVENOUS | Status: DC | PRN
  Filled 2015-04-16: qty 1

## 2015-04-16 MED ORDER — ACETAMINOPHEN 650 MG RE SUPP: 650.0000 mg | Freq: Four times a day (QID) | RECTAL | Status: DC | PRN

## 2015-04-16 MED ORDER — CEFAZOLIN SODIUM-DEXTROSE 2-3 GM-% IV SOLR
2.0000 g | Freq: Once | INTRAVENOUS | Status: AC
Start: 2015-04-16 — End: 2015-04-16
  Administered 2015-04-16: 2 g via INTRAVENOUS
  Filled 2015-04-16: qty 50

## 2015-04-16 MED ORDER — LEVOTHYROXINE SODIUM 75 MCG PO TABS
75.0000 ug | ORAL_TABLET | Freq: Every day | ORAL | Status: DC
  Administered 2015-04-17 – 2015-04-23 (×7): 75 ug via ORAL
  Filled 2015-04-16 (×7): qty 1

## 2015-04-16 MED ORDER — AMIODARONE HCL 100 MG PO TABS
200.0000 mg | ORAL_TABLET | Freq: Every day | ORAL | Status: DC
  Administered 2015-04-17 – 2015-04-23 (×7): 200 mg via ORAL
  Filled 2015-04-16 (×7): qty 2

## 2015-04-16 MED ORDER — HYDROMORPHONE HCL 1 MG/ML IJ SOLN
0.5000 mg | INTRAMUSCULAR | Status: DC | PRN
  Administered 2015-04-17: 1 mg via INTRAVENOUS
  Filled 2015-04-16: qty 1

## 2015-04-16 MED ORDER — DILTIAZEM HCL ER COATED BEADS 120 MG PO CP24
120.0000 mg | ORAL_CAPSULE | Freq: Every day | ORAL | Status: DC
  Administered 2015-04-17 – 2015-04-20 (×4): 120 mg via ORAL
  Filled 2015-04-16 (×5): qty 1

## 2015-04-16 MED ORDER — ONDANSETRON HCL 4 MG/2ML IJ SOLN
4.0000 mg | Freq: Four times a day (QID) | INTRAMUSCULAR | Status: DC | PRN
  Administered 2015-04-17: 4 mg via INTRAVENOUS

## 2015-04-16 MED ORDER — OXYCODONE HCL 5 MG PO TABS: 5.0000 mg | ORAL_TABLET | ORAL | Status: DC | PRN

## 2015-04-16 MED ORDER — ONDANSETRON HCL 4 MG PO TABS: 4.0000 mg | ORAL_TABLET | Freq: Four times a day (QID) | ORAL | Status: DC | PRN

## 2015-04-16 MED ORDER — ACETAMINOPHEN 325 MG PO TABS: 650.0000 mg | ORAL_TABLET | Freq: Four times a day (QID) | ORAL | Status: DC | PRN

## 2015-04-16 MED ORDER — SODIUM CHLORIDE 0.9 % IV SOLN
INTRAVENOUS | Status: DC
  Administered 2015-04-16: 23:00:00 via INTRAVENOUS

## 2015-04-16 MED ORDER — DILTIAZEM HCL ER COATED BEADS 120 MG PO TB24: 120.0000 mg | ORAL_TABLET | Freq: Every day | ORAL | Status: DC

## 2015-04-16 NOTE — H&P (Signed)
[facility] Admission History and Physical       Jamie Daugherty [id number] DOB: 08-Nov-1918 DOA: 04/16/2015  Referring physician: EDP PCP: Stephens Shire, MD  Specialists:   Chief Complaint: Left Hip Pain  HPI: Jamie Daugherty is a 82 y.o. female with a history of Paroxsymal Atrial Fibrillation, Systolic CHF, HTN who presents to the ED with complaints of tripping and falling on the carpet in her home last night.  In the AM, she had increased left hip pain and was not able to bear weight on her left leg.   She was evaluated in the ED and was found to have a Left Femoral Neck Fracture on X-ray, and Orthopedics Dr Tomie China was consulted.  She was referred for admission with a plan for probable surgery in the AM.   She is not on any blood thinners.     Review of Systems:  Constitutional: No Weight Loss, No Weight Gain, Night Sweats, Fevers, Chills, Dizziness, Light Headedness, Fatigue, or Generalized Weakness HEENT: No Headaches, Difficulty Swallowing,Tooth/Dental Problems,Sore Throat,  No Sneezing, Rhinitis, Ear Ache, Nasal Congestion, or Post Nasal Drip,  Cardio-vascular:  No Chest pain, Orthopnea, PND, Edema in Lower Extremities, Anasarca, Dizziness, Palpitations  Resp: No Dyspnea, No DOE, No Productive Cough, No Non-Productive Cough, No Hemoptysis, No Wheezing.    GI: No Heartburn, Indigestion, Abdominal Pain, Nausea, Vomiting, Diarrhea, Constipation, Hematemesis, Hematochezia, Melena, Change in Bowel Habits,  Loss of Appetite  GU: No Dysuria, No Change in Color of Urine, No Urgency or Urinary Frequency, No Flank pain.  Musculoskeletal: +Left Hip Pain, with Decreased Range of Motion, No Back Pain.  Neurologic: No Syncope, No Seizures, Muscle Weakness, Paresthesia, Vision Disturbance or Loss, No Diplopia, No Vertigo, +Difficulty Walking,  Skin: No Rash or Lesions. Psych: No Change in Mood or Affect, No Depression or Anxiety, No Memory loss, No Confusion, or Hallucinations   Past  Medical History  Diagnosis Date  . Hypertension   . Atrial fibrillation ([place])   . Syncope   . Pacemaker - dual chamber [facility] implanted 2013 01/25/2013     Past Surgical History  Procedure Laterality Date  . Pacemaker insertion    . Abdominal hysterectomy        Prior to Admission medications   Medication Sig Start Date End Date Taking? Authorizing Provider  amiodarone (PACERONE) 200 MG tablet Take 1 tablet (200 mg total) by mouth daily. 10/21/14  Yes Mihai Croitoru, MD  atenolol (TENORMIN) 50 MG tablet Take 1 tablet (50 mg total) by mouth daily. 11/12/14  Yes Mihai Croitoru, MD  diltiazem (CARDIZEM LA) 120 MG 24 hr tablet Take 1 tablet (120 mg total) by mouth daily. 12/26/14  Yes Mihai Croitoru, MD  ibuprofen (ADVIL,MOTRIN) 200 MG tablet Take 400 mg by mouth every 6 (six) hours as needed for mild pain.   Yes Historical Provider, MD  levothyroxine (SYNTHROID, LEVOTHROID) 75 MCG tablet Take 1 tablet (75 mcg total) by mouth daily before breakfast. 02/18/15  Yes Sanda Klein, MD     Allergies  Allergen Reactions  . Penicillins Other (See Comments)    Unknown. Tolerates cephalexin.    Social History:  reports that she has quit smoking. She does not have any smokeless tobacco history on file. She reports that she drinks alcohol. She reports that she does not use illicit drugs.    Family History  Problem Relation Age of Onset  . Heart disease Mother   . Heart disease Father        Physical  Exam:  GEN:  Pleasant Elderly Thin 82 y.o. Caucasian female examined and in no acute distress; cooperative with exam Filed Vitals:   04/16/15 2000 04/16/15 2030 04/16/15 2100 04/16/15 2200  BP: 166/86 165/79 163/79 171/85  Pulse: 71 70 70 70  Temp:      TempSrc:      Resp: 19 14 17 18   SpO2: 95% 95% 94% 93%   Blood pressure 171/85, pulse 70, temperature 97.6 F (36.4 C), temperature source Oral, resp. rate 18, SpO2 93 %. PSYCH: She is alert and oriented x4; does not appear  anxious does not appear depressed; affect is normal HEENT: Normocephalic and Atraumatic, Mucous membranes pink; PERRLA; EOM intact; Fundi:  Benign;  No scleral icterus, Nares: Patent, Oropharynx: Clear, Fair Dentition,    Neck:  FROM, No Cervical Lymphadenopathy nor Thyromegaly or Carotid Bruit; No JVD; Breasts:: Not examined CHEST WALL: No tenderness CHEST: Normal respiration, clear to auscultation bilaterally HEART: Regular rate and rhythm; no murmurs rubs or gallops BACK: No kyphosis or scoliosis; No CVA tenderness ABDOMEN: Positive Bowel Sounds, Scaphoid, Soft Non-Tender, No Rebound or Guarding; No Masses, No Organomegaly Rectal Exam: Not done EXTREMITIES: No Cyanosis, Clubbing, or Edema; No Ulcerations. Genitalia: not examined PULSES: 2+ and symmetric SKIN: Normal hydration no rash or ulceration CNS:  Alert and Oriented x 4, No Focal Deficits Vascular: pulses palpable throughout    Labs on Admission:  Basic Metabolic Panel:  Recent Labs Lab 04/16/15 1930  NA 136  K 3.7  CL 98*  CO2 25  GLUCOSE 113*  BUN 14  CREATININE 0.81  CALCIUM 9.3   Liver Function Tests: No results for input(s): AST, ALT, ALKPHOS, BILITOT, PROT, ALBUMIN in the last 168 hours. No results for input(s): LIPASE, AMYLASE in the last 168 hours. No results for input(s): AMMONIA in the last 168 hours. CBC:  Recent Labs Lab 04/16/15 1930  WBC 10.0  NEUTROABS 8.2*  HGB 12.7  HCT 39.1  MCV 88.9  PLT 223   Cardiac Enzymes: No results for input(s): CKTOTAL, CKMB, CKMBINDEX, TROPONINI in the last 168 hours.  BNP (last 3 results) No results for input(s): BNP in the last 8760 hours.  ProBNP (last 3 results) No results for input(s): PROBNP in the last 8760 hours.  CBG: No results for input(s): GLUCAP in the last 168 hours.  Radiological Exams on Admission: Dg Hip Unilat With Pelvis 2-3 Views Left  04/16/2015  CLINICAL DATA:  Family states that today they think pt fell and injured her left hip,  she states she fell while walking yesterday. Pt normally ambulates independently but cannot put any weight on her left hip today. EXAM: DG HIP (WITH OR WITHOUT PELVIS) 2-3V LEFT COMPARISON:  None. FINDINGS: There is a left subcapital femoral neck fracture, non comminuted and without significant displacement. There is mild valgus angulation. No other fractures. Hip joints, SI joints and symphysis pubis are normally aligned. Bones are diffusely demineralized. IMPRESSION: Subcapital left femoral neck fracture with valgus angulation. Electronically Signed   By: Lajean Manes M.D.   On: 04/16/2015 17:57     EKG: Independently reviewed. Paced Rhythm rate =70   Assessment/Plan:   82 y.o. female with      Principal Problem:    Hip fracture (HCC)    Orthopedics Consulted Dr. Tomie China     Plan for Surgery in AM     NPO after Midnight    Pain Control PRN     Bedrest   Active Problems:  Fall- Mechanical in [place]         PAF (paroxysmal atrial fibrillation) (HCC)    Continue Amiodarone, Diltiazem, and Atenolol Rx    Taken off Anticoagulant Rx due to Epistaxis+ in 10/2014        Chronic systolic heart failure (HCC)     Monitor I/Os        Hypertension    Continue Atenolol, and Diltiazem Rx      DVT Prophylaxis    SCDs    Code Status:     FULL CODE   Family Communication:   Daughter In Law at Bedside    Disposition Plan:    Inpatient Status with Expected LOS 2-3 days     Time spent: [place] [facility] Pager [phone]   If [place] Please Contact the Day Rounding Team MD for [facility]  If 7PM-7AM, Please Contact Night-Floor Coverage  [email or web address] Password [email or web address] 04/16/2015, 10:25 PM     ADDENDUM:   Patient was seen and examined on 04/16/2015

## 2015-04-16 NOTE — ED Notes (Signed)
Pt placed on the bedpan

## 2015-04-16 NOTE — ED Provider Notes (Signed)
CSN: [id number]     Arrival date & time 04/16/15  1709 History   First MD Initiated Contact with Patient 04/16/15 1823     Chief Complaint  Patient presents with  . Fall  . Hip Pain   level V caveat: Dementia HPI Pt may have fallen yesterday.  SHe has dementia so she is not sure about when she fell or if she did for certain.  Caregiver this morning noticed that she couldn't stand and she had pain in her left leg.  She wont put any weight down on the left leg.  No headache, no chest pain, no vomiting. The patient herself does not recall that she has not been able to walk today. Several times her family had to tell her that no she had not been able to walk at all Past Medical History  Diagnosis Date  . Hypertension   . Atrial fibrillation ([place])   . Syncope   . Pacemaker - dual chamber [facility] implanted 2013 01/25/2013   Past Surgical History  Procedure Laterality Date  . Pacemaker insertion    . Abdominal hysterectomy     Family History  Problem Relation Age of Onset  . Heart disease Mother   . Heart disease Father    Social History  Substance Use Topics  . Smoking status: Former Research scientist (life sciences)  . Smokeless tobacco: None  . Alcohol Use: Yes     Comment: occ   OB History    No data available     Review of Systems  All other systems reviewed and are negative.     Allergies  Penicillins  Home Medications   Prior to Admission medications   Medication Sig Start Date End Date Taking? Authorizing Provider  amiodarone (PACERONE) 200 MG tablet Take 1 tablet (200 mg total) by mouth daily. 10/21/14  Yes Mihai Croitoru, MD  atenolol (TENORMIN) 50 MG tablet Take 1 tablet (50 mg total) by mouth daily. 11/12/14  Yes Mihai Croitoru, MD  diltiazem (CARDIZEM LA) 120 MG 24 hr tablet Take 1 tablet (120 mg total) by mouth daily. 12/26/14  Yes Mihai Croitoru, MD  ibuprofen (ADVIL,MOTRIN) 200 MG tablet Take 400 mg by mouth every 6 (six) hours as needed for mild pain.   Yes Historical  Provider, MD  levothyroxine (SYNTHROID, LEVOTHROID) 75 MCG tablet Take 1 tablet (75 mcg total) by mouth daily before breakfast. 02/18/15  Yes Mihai Croitoru, MD   BP 164/76 mmHg  Pulse 75  Temp(Src) 97.6 F (36.4 C) (Oral)  Resp 18  SpO2 95% Physical Exam  Constitutional: She appears well-developed and well-nourished. No distress.  HENT:  Head: Normocephalic and atraumatic.  Right Ear: External ear normal.  Left Ear: External ear normal.  Eyes: Conjunctivae are normal. Right eye exhibits no discharge. Left eye exhibits no discharge. No scleral icterus.  Neck: Neck supple. No tracheal deviation present.  Cardiovascular: Normal rate, regular rhythm and intact distal pulses.   Pulmonary/Chest: Effort normal and breath sounds normal. No stridor. No respiratory distress. She has no wheezes. She has no rales.  Abdominal: Soft. Bowel sounds are normal. She exhibits no distension. There is no tenderness. There is no rebound and no guarding.  Musculoskeletal: She exhibits no edema.       Left hip: She exhibits tenderness.  Neurological: She is alert. She has normal strength. No cranial nerve deficit (no facial droop, extraocular movements intact, no slurred speech) or sensory deficit. She exhibits normal muscle tone. She displays no seizure activity. Coordination  normal.  Skin: Skin is warm and dry. No rash noted.  Psychiatric: She has a normal mood and affect. Cognition and memory are impaired.  Nursing note and vitals reviewed.   ED Course  Procedures (including critical care time) Labs Review Labs Reviewed  BASIC METABOLIC PANEL - Abnormal; Notable for the following:    Chloride 98 (*)    Glucose, Bld 113 (*)    All other components within normal limits  CBC WITH DIFFERENTIAL/PLATELET - Abnormal; Notable for the following:    Neutro Abs 8.2 (*)    All other components within normal limits  PROTIME-INR  TYPE AND SCREEN  ABO/RH    Imaging Review Dg Hip Unilat With Pelvis 2-3 Views  Left  04/16/2015  CLINICAL DATA:  Family states that today they think pt fell and injured her left hip, she states she fell while walking yesterday. Pt normally ambulates independently but cannot put any weight on her left hip today. EXAM: DG HIP (WITH OR WITHOUT PELVIS) 2-3V LEFT COMPARISON:  None. FINDINGS: There is a left subcapital femoral neck fracture, non comminuted and without significant displacement. There is mild valgus angulation. No other fractures. Hip joints, SI joints and symphysis pubis are normally aligned. Bones are diffusely demineralized. IMPRESSION: Subcapital left femoral neck fracture with valgus angulation. Electronically Signed   By: Lajean Manes M.D.   On: 04/16/2015 17:57   I have personally reviewed and evaluated these images and lab results as part of my medical decision-making.   EKG Interpretation   Date/Time:  Wednesday April 16 2015 20:01:07 EST Ventricular Rate:  70 PR Interval:  226 QRS Duration: 152 QT Interval:  512 QTC Calculation: 553 R Axis:   -76 Text Interpretation:  Atrial-paced rhythm Left bundle branch block pacing  noted on prior ECG Confirmed by Taaj Hurlbut  MD-J, Joniya Boberg [id number]) on 04/16/2015  8:05:04 PM      MDM   Final diagnoses:  Femoral neck fracture, left, closed, initial encounter    1944  Discussed with Dr Erlinda Hong.  Plan on surgery tomorrow.  NPO after midnight.  He will see them in the AM.  Labs unremarkable.  Will admit to medical service   Dorie Rank, MD 04/16/15 2110

## 2015-04-16 NOTE — ED Notes (Signed)
Pt lives independently. Family states that today they think pt fell and injured her left hip. Pt normally ambulates independently but cannot put any weight on her left hip today.

## 2015-04-16 NOTE — ED Notes (Signed)
Family reports that pt has some dementia, pt does not recall falling. Family states she is at her baseline mental status.

## 2015-04-17 ENCOUNTER — Inpatient Hospital Stay (HOSPITAL_COMMUNITY): Payer: Medicare Other | Admitting: Anesthesiology

## 2015-04-17 ENCOUNTER — Encounter (HOSPITAL_COMMUNITY): Payer: Self-pay | Admitting: Anesthesiology

## 2015-04-17 ENCOUNTER — Inpatient Hospital Stay (HOSPITAL_COMMUNITY): Payer: Medicare Other

## 2015-04-17 ENCOUNTER — Encounter (HOSPITAL_COMMUNITY)
Admission: EM | Disposition: A | Discharge status: Skilled Nursing Facility | Payer: Self-pay | Source: Home / Self Care | Attending: Internal Medicine

## 2015-04-17 HISTORY — PX: ANTERIOR APPROACH HEMI HIP ARTHROPLASTY: SHX6690

## 2015-04-17 LAB — CBC
HEMATOCRIT: 36.5 % (ref 36.0–46.0)
HEMOGLOBIN: 12.2 g/dL (ref 12.0–15.0)
MCH: 29.3 pg (ref 26.0–34.0)
MCHC: 33.4 g/dL (ref 30.0–36.0)
MCV: 87.7 fL (ref 78.0–100.0)
Platelets: 198 10*3/uL (ref 150–400)
RBC: 4.16 MIL/uL (ref 3.87–5.11)
RDW: 14.8 % (ref 11.5–15.5)
WBC: 7.4 10*3/uL (ref 4.0–10.5)

## 2015-04-17 LAB — BASIC METABOLIC PANEL
ANION GAP: 11 (ref 5–15)
BUN: 10 mg/dL (ref 6–20)
CHLORIDE: 100 mmol/L — AB (ref 101–111)
CO2: 25 mmol/L (ref 22–32)
Calcium: 8.4 mg/dL — ABNORMAL LOW (ref 8.9–10.3)
Creatinine, Ser: 0.7 mg/dL (ref 0.44–1.00)
GFR calc Af Amer: >60 mL/min (ref >60)
GFR calc non Af Amer: >60 mL/min (ref >60)
GLUCOSE: 99 mg/dL (ref 65–99)
POTASSIUM: 3.1 mmol/L — AB (ref 3.5–5.1)
Sodium: 136 mmol/L (ref 135–145)

## 2015-04-17 LAB — SURGICAL PCR SCREEN
MRSA, PCR: NEGATIVE
Staphylococcus aureus: NEGATIVE

## 2015-04-17 SURGERY — HEMIARTHROPLASTY, HIP, DIRECT ANTERIOR APPROACH, FOR FRACTURE
Anesthesia: General | Laterality: Left

## 2015-04-17 MED ORDER — MORPHINE SULFATE (PF) 2 MG/ML IV SOLN: 0.5000 mg | INTRAVENOUS | Status: DC | PRN

## 2015-04-17 MED ORDER — FENTANYL CITRATE (PF) 100 MCG/2ML IJ SOLN
25.0000 ug | INTRAMUSCULAR | Status: DC | PRN
  Administered 2015-04-17 (×3): 25 ug via INTRAVENOUS

## 2015-04-17 MED ORDER — OXYCODONE HCL 5 MG PO TABS
5.0000 mg | ORAL_TABLET | ORAL | Status: DC | PRN
  Administered 2015-04-18: 10 mg via ORAL
  Filled 2015-04-17: qty 2

## 2015-04-17 MED ORDER — POTASSIUM CHLORIDE 10 MEQ/100ML IV SOLN
10.0000 meq | INTRAVENOUS | Status: AC
  Administered 2015-04-17 (×4): 10 meq via INTRAVENOUS
  Filled 2015-04-17 (×4): qty 100

## 2015-04-17 MED ORDER — METOCLOPRAMIDE HCL 5 MG/ML IJ SOLN
5.0000 mg | Freq: Three times a day (TID) | INTRAMUSCULAR | Status: DC | PRN
  Administered 2015-04-17: 10 mg via INTRAVENOUS
  Filled 2015-04-17: qty 2

## 2015-04-17 MED ORDER — PROPOFOL 10 MG/ML IV BOLUS
INTRAVENOUS | Status: AC
  Filled 2015-04-17: qty 20

## 2015-04-17 MED ORDER — LIDOCAINE HCL (CARDIAC) 20 MG/ML IV SOLN
INTRAVENOUS | Status: DC | PRN
  Administered 2015-04-17: 40 mg via INTRAVENOUS

## 2015-04-17 MED ORDER — ONDANSETRON HCL 4 MG/2ML IJ SOLN
INTRAMUSCULAR | Status: AC
  Filled 2015-04-17: qty 2

## 2015-04-17 MED ORDER — ALUM & MAG HYDROXIDE-SIMETH 200-200-20 MG/5ML PO SUSP: 30.0000 mL | ORAL | Status: DC | PRN

## 2015-04-17 MED ORDER — HYDROCODONE-ACETAMINOPHEN 5-325 MG PO TABS
1.0000 | ORAL_TABLET | Freq: Four times a day (QID) | ORAL | Status: DC | PRN
  Administered 2015-04-18 (×2): 1 via ORAL
  Filled 2015-04-17 (×3): qty 1

## 2015-04-17 MED ORDER — ACETAMINOPHEN 650 MG RE SUPP: 650.0000 mg | Freq: Four times a day (QID) | RECTAL | Status: DC | PRN

## 2015-04-17 MED ORDER — ENOXAPARIN SODIUM 40 MG/0.4ML ~~LOC~~ SOLN: 40.0000 mg | Freq: Every day | SUBCUTANEOUS | Status: DC

## 2015-04-17 MED ORDER — CEFAZOLIN SODIUM-DEXTROSE 2-3 GM-% IV SOLR
2.0000 g | INTRAVENOUS | Status: AC
  Administered 2015-04-17: 2 g via INTRAVENOUS
  Filled 2015-04-17 (×2): qty 50

## 2015-04-17 MED ORDER — METOCLOPRAMIDE HCL 5 MG PO TABS: 5.0000 mg | ORAL_TABLET | Freq: Three times a day (TID) | ORAL | Status: DC | PRN

## 2015-04-17 MED ORDER — EPHEDRINE SULFATE 50 MG/ML IJ SOLN
INTRAMUSCULAR | Status: AC
  Filled 2015-04-17: qty 1

## 2015-04-17 MED ORDER — LACTATED RINGERS IV SOLN
INTRAVENOUS | Status: DC
  Administered 2015-04-17 (×2): via INTRAVENOUS

## 2015-04-17 MED ORDER — 0.9 % SODIUM CHLORIDE (POUR BTL) OPTIME
TOPICAL | Status: DC | PRN
  Administered 2015-04-17: 1000 mL

## 2015-04-17 MED ORDER — FENTANYL CITRATE (PF) 100 MCG/2ML IJ SOLN
INTRAMUSCULAR | Status: AC
  Administered 2015-04-17: 25 ug via INTRAVENOUS
  Filled 2015-04-17: qty 2

## 2015-04-17 MED ORDER — PHENOL 1.4 % MT LIQD
1.0000 | OROMUCOSAL | Status: DC | PRN
Start: 2015-04-17 — End: 2015-04-23

## 2015-04-17 MED ORDER — SUCCINYLCHOLINE CHLORIDE 20 MG/ML IJ SOLN
INTRAMUSCULAR | Status: DC | PRN
  Administered 2015-04-17: 80 mg via INTRAVENOUS

## 2015-04-17 MED ORDER — ACETAMINOPHEN 325 MG PO TABS: 650.0000 mg | ORAL_TABLET | Freq: Four times a day (QID) | ORAL | Status: DC | PRN

## 2015-04-17 MED ORDER — SODIUM CHLORIDE 0.9 % IR SOLN
Status: DC | PRN
  Administered 2015-04-17 (×2): 1000 mL

## 2015-04-17 MED ORDER — ENOXAPARIN SODIUM 40 MG/0.4ML ~~LOC~~ SOLN
40.0000 mg | SUBCUTANEOUS | Status: DC
  Administered 2015-04-18 – 2015-04-23 (×6): 40 mg via SUBCUTANEOUS
  Filled 2015-04-17 (×6): qty 0.4

## 2015-04-17 MED ORDER — CEFAZOLIN SODIUM-DEXTROSE 2-3 GM-% IV SOLR
2.0000 g | Freq: Four times a day (QID) | INTRAVENOUS | Status: AC
  Administered 2015-04-17 – 2015-04-18 (×3): 2 g via INTRAVENOUS
  Filled 2015-04-17 (×4): qty 50

## 2015-04-17 MED ORDER — SODIUM CHLORIDE 0.9 % IV SOLN
INTRAVENOUS | Status: DC
  Administered 2015-04-19 – 2015-04-20 (×3): via INTRAVENOUS

## 2015-04-17 MED ORDER — ONDANSETRON HCL 4 MG PO TABS: 4.0000 mg | ORAL_TABLET | Freq: Four times a day (QID) | ORAL | Status: DC | PRN

## 2015-04-17 MED ORDER — POTASSIUM CHLORIDE 10 MEQ/100ML IV SOLN
10.0000 meq | INTRAVENOUS | Status: DC
Start: 2015-04-17 — End: 2015-04-17

## 2015-04-17 MED ORDER — MENTHOL 3 MG MT LOZG: 1.0000 | LOZENGE | OROMUCOSAL | Status: DC | PRN

## 2015-04-17 MED ORDER — LIDOCAINE HCL (CARDIAC) 20 MG/ML IV SOLN
INTRAVENOUS | Status: AC
  Filled 2015-04-17: qty 5

## 2015-04-17 MED ORDER — ONDANSETRON HCL 4 MG/2ML IJ SOLN
4.0000 mg | Freq: Four times a day (QID) | INTRAMUSCULAR | Status: DC | PRN
  Administered 2015-04-17: 4 mg via INTRAVENOUS

## 2015-04-17 MED ORDER — DEXTROSE 5 % IV SOLN
10.0000 mg | INTRAVENOUS | Status: DC | PRN
  Administered 2015-04-17: 20 ug/min via INTRAVENOUS

## 2015-04-17 MED ORDER — METHOCARBAMOL 1000 MG/10ML IJ SOLN
500.0000 mg | Freq: Four times a day (QID) | INTRAVENOUS | Status: DC | PRN
  Filled 2015-04-17: qty 5

## 2015-04-17 MED ORDER — SODIUM CHLORIDE 0.9 % IJ SOLN
INTRAMUSCULAR | Status: AC
  Filled 2015-04-17: qty 10

## 2015-04-17 MED ORDER — METHOCARBAMOL 500 MG PO TABS: 500.0000 mg | ORAL_TABLET | Freq: Four times a day (QID) | ORAL | Status: DC | PRN

## 2015-04-17 MED ORDER — OXYCODONE-ACETAMINOPHEN 5-325 MG PO TABS: 1.0000 | ORAL_TABLET | ORAL | Status: DC | PRN

## 2015-04-17 MED ORDER — ETOMIDATE 2 MG/ML IV SOLN
INTRAVENOUS | Status: DC | PRN
  Administered 2015-04-17: 8 mg via INTRAVENOUS

## 2015-04-17 MED ORDER — FENTANYL CITRATE (PF) 100 MCG/2ML IJ SOLN
INTRAMUSCULAR | Status: DC | PRN
  Administered 2015-04-17 (×2): 50 ug via INTRAVENOUS
  Administered 2015-04-17 (×2): 25 ug via INTRAVENOUS

## 2015-04-17 MED ORDER — FENTANYL CITRATE (PF) 250 MCG/5ML IJ SOLN
INTRAMUSCULAR | Status: AC
Start: 2015-04-17 — End: 2015-04-17
  Filled 2015-04-17: qty 5

## 2015-04-17 SURGICAL SUPPLY — 50 items
CAPT HIP HEMI 1 ×3 IMPLANT
CELLS DAT CNTRL 66122 CELL SVR (MISCELLANEOUS) ×1 IMPLANT
COVER SURGICAL LIGHT HANDLE (MISCELLANEOUS) ×3 IMPLANT
DRAPE C-ARM 42X72 X-RAY (DRAPES) ×3 IMPLANT
DRAPE IMP U-DRAPE 54X76 (DRAPES) ×3 IMPLANT
DRAPE STERI IOBAN 125X83 (DRAPES) ×3 IMPLANT
DRAPE U-SHAPE 47X51 STRL (DRAPES) ×9 IMPLANT
DRESSING ALLEVYN LIFE SACRUM (GAUZE/BANDAGES/DRESSINGS) ×3 IMPLANT
DRSG AQUACEL AG ADV 3.5X10 (GAUZE/BANDAGES/DRESSINGS) ×3 IMPLANT
DRSG MEPILEX BORDER 4X8 (GAUZE/BANDAGES/DRESSINGS) ×3 IMPLANT
DURAPREP 26ML APPLICATOR (WOUND CARE) ×3 IMPLANT
ELECT BLADE 4.0 EZ CLEAN MEGAD (MISCELLANEOUS) ×3
ELECT REM PT RETURN 9FT ADLT (ELECTROSURGICAL) ×3
ELECTRODE BLDE 4.0 EZ CLN MEGD (MISCELLANEOUS) ×1 IMPLANT
ELECTRODE REM PT RTRN 9FT ADLT (ELECTROSURGICAL) ×1 IMPLANT
GAUZE XEROFORM 1X8 LF (GAUZE/BANDAGES/DRESSINGS) ×3 IMPLANT
GLOVE BIO SURGEON STRL SZ 6.5 (GLOVE) ×2 IMPLANT
GLOVE BIO SURGEON STRL SZ7 (GLOVE) ×3 IMPLANT
GLOVE BIO SURGEONS STRL SZ 6.5 (GLOVE) ×1
GLOVE SKINSENSE NS SZ7.5 (GLOVE) ×4
GLOVE SKINSENSE STRL SZ7.5 (GLOVE) ×2 IMPLANT
GLOVE SURG SS PI 6.0 STRL IVOR (GLOVE) ×3 IMPLANT
GLOVE SURG SS PI 8.0 STRL IVOR (GLOVE) ×9 IMPLANT
GLOVE SURG SYN 7.5  E (GLOVE) ×2
GLOVE SURG SYN 7.5 E (GLOVE) ×1 IMPLANT
GOWN SRG XL XLNG 56XLVL 4 (GOWN DISPOSABLE) ×1 IMPLANT
GOWN STRL NON-REIN XL XLG LVL4 (GOWN DISPOSABLE) ×2
GOWN STRL REUS W/ TWL LRG LVL3 (GOWN DISPOSABLE) IMPLANT
GOWN STRL REUS W/TWL LRG LVL3 (GOWN DISPOSABLE)
HANDPIECE INTERPULSE COAX TIP (DISPOSABLE) ×2
KIT BASIN OR (CUSTOM PROCEDURE TRAY) ×3 IMPLANT
PACK TOTAL JOINT (CUSTOM PROCEDURE TRAY) ×3 IMPLANT
RETRACTOR ALEXIS WOUND SMALL (INSTRUMENTS) ×1
RETRACTOR WOUND ALEXIS MEDIUM (MISCELLANEOUS) ×1
RTRCTR WOUND ALEXIS 18CM MED (MISCELLANEOUS) ×2
RTRCTR WOUND ALEXIS 18CM SML (INSTRUMENTS) ×2
SAVER CELL AAL HAEMONETICS (INSTRUMENTS) ×1 IMPLANT
SAW OSC TIP CART 19.5X105X1.3 (SAW) ×3 IMPLANT
SEALER BIPOLAR AQUA 6.0 (INSTRUMENTS) IMPLANT
SET HNDPC FAN SPRY TIP SCT (DISPOSABLE) ×1 IMPLANT
SUT ETHIBOND 2 V 37 (SUTURE) ×3 IMPLANT
SUT ETHIBOND NAB CT1 #1 30IN (SUTURE) ×9 IMPLANT
SUT ETHILON 3 0 PS 1 (SUTURE) ×6 IMPLANT
SUT VIC AB 0 CT1 27 (SUTURE) ×2
SUT VIC AB 0 CT1 27XBRD ANBCTR (SUTURE) ×1 IMPLANT
SUT VIC AB 1 CT1 27 (SUTURE) ×2
SUT VIC AB 1 CT1 27XBRD ANBCTR (SUTURE) ×1 IMPLANT
SUT VIC AB 2-0 CT1 27 (SUTURE) ×4
SUT VIC AB 2-0 CT1 TAPERPNT 27 (SUTURE) ×2 IMPLANT
TOWEL OR 17X26 10 PK STRL BLUE (TOWEL DISPOSABLE) ×3 IMPLANT

## 2015-04-17 NOTE — Anesthesia Procedure Notes (Signed)
Procedure Name: Intubation Date/Time: 04/17/2015 4:04 PM Performed by: Rebekah Chesterfield L Pre-anesthesia Checklist: Patient identified, Emergency Drugs available, Suction available, Patient being monitored and Timeout performed Patient Re-evaluated:Patient Re-evaluated prior to inductionOxygen Delivery Method: Circle system utilized Preoxygenation: Pre-oxygenation with 100% oxygen Intubation Type: IV induction Ventilation: Mask ventilation without difficulty Laryngoscope Size: Mac and 3 Grade View: Grade II Tube type: Oral Tube size: 7.0 mm Number of attempts: 1 Airway Equipment and Method: Stylet Placement Confirmation: ETT inserted through vocal cords under direct vision,  breath sounds checked- equal and bilateral and positive ETCO2 Secured at: 20 cm Tube secured with: Tape Dental Injury: Teeth and Oropharynx as per pre-operative assessment

## 2015-04-17 NOTE — Discharge Instructions (Signed)
° ° °  1. Change dressings as needed °2. May shower but keep incisions covered and dry °3. Take lovenox to prevent blood clots °4. Take stool softeners as needed °5. Take pain meds as needed ° °

## 2015-04-17 NOTE — Consult Note (Signed)
ORTHOPAEDIC CONSULTATION  REQUESTING PHYSICIAN: Jamie Hazel, MD  Chief Complaint: Left hip fracture  HPI: Jamie Daugherty is a 82 y.o. female who presents with left hip fracture s/p mechanical fall.  The patient endorses severe pain in the left hip, that does not radiate, grinding in quality, worse with any movement, better with immobilization.  Denies LOC/fever/chills/nausea/vomiting.  Walks without assistive devices (Tones, cane, wheelchair).  Does live independently and highly functional for her age.    Past Medical History  Diagnosis Date  . Hypertension   . Atrial fibrillation ([place])   . Syncope   . Pacemaker - dual chamber [facility] implanted 2013 01/25/2013   Past Surgical History  Procedure Laterality Date  . Pacemaker insertion    . Abdominal hysterectomy     Social History   Social History  . Marital Status: Unknown    Spouse Name: N/A  . Number of Children: N/A  . Years of Education: N/A   Social History Main Topics  . Smoking status: Former Research scientist (life sciences)  . Smokeless tobacco: None  . Alcohol Use: Yes     Comment: occ  . Drug Use: No  . Sexual Activity: Not Asked   Other Topics Concern  . None   Social History Narrative   Family History  Problem Relation Age of Onset  . Heart disease Mother   . Heart disease Father    Allergies  Allergen Reactions  . Penicillins Other (See Comments)    Unknown. Tolerates cephalexin.   Prior to Admission medications   Medication Sig Start Date End Date Taking? Authorizing Provider  amiodarone (PACERONE) 200 MG tablet Take 1 tablet (200 mg total) by mouth daily. 10/21/14  Yes Mihai Croitoru, MD  atenolol (TENORMIN) 50 MG tablet Take 1 tablet (50 mg total) by mouth daily. 11/12/14  Yes Mihai Croitoru, MD  diltiazem (CARDIZEM LA) 120 MG 24 hr tablet Take 1 tablet (120 mg total) by mouth daily. 12/26/14  Yes Mihai Croitoru, MD  ibuprofen (ADVIL,MOTRIN) 200 MG tablet Take 400 mg by mouth every 6 (six) hours as needed  for mild pain.   Yes Historical Provider, MD  levothyroxine (SYNTHROID, LEVOTHROID) 75 MCG tablet Take 1 tablet (75 mcg total) by mouth daily before breakfast. 02/18/15  Yes Sanda Klein, MD   Dg Hip Unilat With Pelvis 2-3 Views Left  04/16/2015  CLINICAL DATA:  Family states that today they think pt fell and injured her left hip, she states she fell while walking yesterday. Pt normally ambulates independently but cannot put any weight on her left hip today. EXAM: DG HIP (WITH OR WITHOUT PELVIS) 2-3V LEFT COMPARISON:  None. FINDINGS: There is a left subcapital femoral neck fracture, non comminuted and without significant displacement. There is mild valgus angulation. No other fractures. Hip joints, SI joints and symphysis pubis are normally aligned. Bones are diffusely demineralized. IMPRESSION: Subcapital left femoral neck fracture with valgus angulation. Electronically Signed   By: Lajean Manes M.D.   On: 04/16/2015 17:57    All pertinent xrays, MRI, CT independently reviewed and interpreted  Positive ROS: All other systems have been reviewed and were otherwise negative with the exception of those mentioned in the HPI and as above.  Physical Exam: General: Alert, no acute distress Cardiovascular: No pedal edema Respiratory: No cyanosis, no use of accessory musculature GI: No organomegaly, abdomen is soft and non-tender Skin: No lesions in the area of chief complaint Neurologic: Sensation intact distally Psychiatric: Patient is competent for consent with normal  mood and affect Lymphatic: No axillary or cervical lymphadenopathy  MUSCULOSKELETAL:  - severe pain with movement of the hip and extremity - skin intact - NVI distally - compartments soft  Assessment: Left femoral neck hip fracture  Plan: - partial hip replacement is recommended, patient and family are aware of r/b/a and wish to proceed - consent obtained - medical and cardiac issues are stable and optimized - surgery is  planned for today  - Based on history and fracture pattern this likely represents a fragility fracture. - Fragility fractures affect up to one half of women and one third of men after age 46 years and occur in the setting of bone disorder such as osteoporosis or osteopenia and warrant appropriate work-up. - The following are general recommendations that may serve as an outline for an appropriate work-up:  1.) Obtain bone density measurement to confirm presumptive diagnosis, assess severity of osteoporosis and risk of future fracture, and use as baseline for monitoring treatment  2.) Obtain laboratory tests: CBC, ESR, serum calcium, creatinine, albumin,phosphate, alkaline phosphatase, liver transaminases, protein electrophoresis, urinalysis, 25-hydroxyvitamin D.  3.) Exclude secondary causes of low bone mass and skeletal fragility (eg,multiple myeloma, lymphoma) as indicated.  4.) Obtain radiograph of thoracic and lumbar spine, particularly among individuals with back pain or height loss to assess presence of vertebral fractures  5.) Intermittent administration of recombinant human parathyroid hormone  6.) Optimize nutritional status using nutritional supplementation.  7.) Patient/family education to prevent future falls.  8.) Early mobilization and exercise program - exercise decreases the rate of bone loss and has been associated with decreased rate of fragility fractures   Thank you for the consult and the opportunity to see [name]  N. Eduard Roux, MD [place] 7:54 AM

## 2015-04-17 NOTE — Progress Notes (Signed)
[facility] PROGRESS NOTE  Jamie Daugherty [id number] DOB: 1918/06/29 DOA: 04/16/2015 PCP: Stephens Shire, MD  HPI/Brief narrative 82 y.o. female with a history of Paroxsymal Atrial Fibrillation, Systolic CHF, HTN who presents to the ED with complaints of tripping and falling on the carpet in her home last night. In the AM, she had increased left hip pain and was not able to bear weight on her left leg. She was evaluated in the ED and was found to have a Left Femoral Neck Fracture on X-ray, and Orthopedics Dr Tomie China was consulted. She was referred for admission with a plan for probable surgery in the AM. She is not on any blood thinners  Assessment/Plan: 1. Left hip fracture 1. Orthopedic surgery consulted 2. Patient planned for surgery today   2. PAF 1. CHADS-VASc of at least 5 2. Pt reportedly taken off anticoagulation secondary to severe epistaxis 3. Currently rate controlled  4. S/p pacemaker 3. Hypokalemia 1. Replaced 2. Continue to follow lytes 4. Chronic systolic CHF 1. Clinically euvolemic 2. Last EF of 30-35% in 11/16 3. Followed by Dr. Sallyanne Kuster as outpatient 5. HTN 1. BP stable 6. DVT prophylaxis 1. SCD's  Code Status: Full Family Communication: Pt in room Disposition Plan: Possible SNF post-op   Consultants:  Orthopedic Surgery  Procedures:    Antibiotics: Anti-infectives    Start     Dose/Rate Route Frequency Ordered Stop   04/17/15 1630  [MAR Hold]  ceFAZolin (ANCEF) IVPB 2 g/50 mL premix     (MAR Hold since 04/17/15 1439)   2 g 100 mL/hr over 30 Minutes Intravenous To ShortStay Surgical 04/17/15 0239 04/18/15 1630   04/16/15 2000  ceFAZolin (ANCEF) IVPB 2 g/50 mL premix    Comments:  Anesthesia to give preop   2 g 100 mL/hr over 30 Minutes Intravenous  Once 04/16/15 1946 04/16/15 2104      HPI/Subjective: Denies active pain at present  Objective: Filed Vitals:   04/16/15 2230 04/16/15 2317 04/17/15 0300 04/17/15 1126  BP: 164/85  168/71 145/73 155/74  Pulse: 68 70 70 75  Temp:  97.7 F (36.5 C) 98.1 F (36.7 C) 98.1 F (36.7 C)  TempSrc:      Resp: 15 12 12 16   SpO2: 93% 91% 95% 92%    Intake/Output Summary (Last 24 hours) at 04/17/15 1526 Last data filed at 04/17/15 0845  Gross per 24 hour  Intake    100 ml  Output    800 ml  Net   -700 ml   There were no vitals filed for this visit.  Exam:   General:  Awake, in nad  Cardiovascular: regular, s1, s2  Respiratory: normal resp effort, no wheezing  Abdomen: soft, nondistended  Musculoskeletal: perfused, no clubbing   Data Reviewed: Basic Metabolic Panel:  Recent Labs Lab 04/16/15 1930 04/17/15 0325  NA 136 136  K 3.7 3.1*  CL 98* 100*  CO2 25 25  GLUCOSE 113* 99  BUN 14 10  CREATININE 0.81 0.70  CALCIUM 9.3 8.4*   Liver Function Tests: No results for input(s): AST, ALT, ALKPHOS, BILITOT, PROT, ALBUMIN in the last 168 hours. No results for input(s): LIPASE, AMYLASE in the last 168 hours. No results for input(s): AMMONIA in the last 168 hours. CBC:  Recent Labs Lab 04/16/15 1930 04/17/15 0325  WBC 10.0 7.4  NEUTROABS 8.2*  --   HGB 12.7 12.2  HCT 39.1 36.5  MCV 88.9 87.7  PLT 223 198   Cardiac Enzymes: No results  for input(s): CKTOTAL, CKMB, CKMBINDEX, TROPONINI in the last 168 hours. BNP (last 3 results) No results for input(s): BNP in the last 8760 hours.  ProBNP (last 3 results) No results for input(s): PROBNP in the last 8760 hours.  CBG: No results for input(s): GLUCAP in the last 168 hours.  Recent Results (from the past 240 hour(s))  Surgical PCR screen     Status: None   Collection Time: 04/17/15  4:36 AM  Result Value Ref Range Status   MRSA, PCR NEGATIVE NEGATIVE Final   Staphylococcus aureus NEGATIVE NEGATIVE Final    Comment:        The Xpert SA Assay (FDA approved for NASAL specimens in patients over 64 years of age), is one component of a comprehensive surveillance program.  Test performance  has been validated by [facility] for patients greater than or equal to 89 year old. It is not intended to diagnose infection nor to guide or monitor treatment.      Studies: Dg Hip Unilat With Pelvis 2-3 Views Left  04/16/2015  CLINICAL DATA:  Family states that today they think pt fell and injured her left hip, she states she fell while walking yesterday. Pt normally ambulates independently but cannot put any weight on her left hip today. EXAM: DG HIP (WITH OR WITHOUT PELVIS) 2-3V LEFT COMPARISON:  None. FINDINGS: There is a left subcapital femoral neck fracture, non comminuted and without significant displacement. There is mild valgus angulation. No other fractures. Hip joints, SI joints and symphysis pubis are normally aligned. Bones are diffusely demineralized. IMPRESSION: Subcapital left femoral neck fracture with valgus angulation. Electronically Signed   By: Lajean Manes M.D.   On: 04/16/2015 17:57    Scheduled Meds: . [MAR Hold] amiodarone  200 mg Oral Daily  . [MAR Hold] atenolol  50 mg Oral Daily  . [MAR Hold]  ceFAZolin (ANCEF) IV  2 g Intravenous To SS-Surg  . [MAR Hold] diltiazem  120 mg Oral Daily  . [MAR Hold] levothyroxine  75 mcg Oral QAC breakfast   Continuous Infusions: . sodium chloride 50 mL/hr at 04/16/15 2315  . lactated ringers 10 mL/hr at 04/17/15 1459    Principal Problem:   Hip fracture (HCC) Active Problems:   PAF (paroxysmal atrial fibrillation) (HCC)   Chronic systolic heart failure ([place])   Hypertension   Ginette Pitman, [facility] Pager [phone]. If 7PM-7AM, please contact night-coverage at [email or web address], password [facility] 04/17/2015, 3:26 PM  LOS: 1 day

## 2015-04-17 NOTE — Anesthesia Postprocedure Evaluation (Signed)
Anesthesia Post Note  Patient: Jamie Daugherty  Procedure(s) Performed: Procedure(s) (LRB): ANTERIOR APPROACH LEFT HEMI HIP ARTHROPLASTY (Left)  Patient location during evaluation: PACU Anesthesia Type: General Level of consciousness: awake and alert Pain management: pain level controlled Vital Signs Assessment: post-procedure vital signs reviewed and stable Respiratory status: spontaneous breathing, nonlabored ventilation, respiratory function stable and patient connected to nasal cannula oxygen Cardiovascular status: blood pressure returned to baseline and stable Postop Assessment: no signs of nausea or vomiting Anesthetic complications: no    Last Vitals:  Filed Vitals:   04/17/15 1830 04/17/15 1857  BP: 158/85 158/72  Pulse: 70 70  Temp:  36.4 C  Resp: 11 16    Last Pain:  Filed Vitals:   04/17/15 1858  PainSc: 0-No pain                 Zenaida Deed

## 2015-04-17 NOTE — Transfer of Care (Signed)
Immediate Anesthesia Transfer of Care Note  Patient: Jamie Daugherty  Procedure(s) Performed: Procedure(s): ANTERIOR APPROACH LEFT HEMI HIP ARTHROPLASTY (Left)  Patient Location: PACU  Anesthesia Type:General  Level of Consciousness: awake, alert , oriented and patient cooperative  Airway & Oxygen Therapy: Patient Spontanous Breathing and Patient connected to nasal cannula oxygen  Post-op Assessment: Report given to RN, Post -op Vital signs reviewed and stable and Patient moving all extremities X 4  Post vital signs: Reviewed and stable  Last Vitals:  Filed Vitals:   04/17/15 1126 04/17/15 1753  BP: 155/74   Pulse: 75 70  Temp: 36.7 C 37 C  Resp: 16 15    Complications: No apparent anesthesia complications

## 2015-04-17 NOTE — Op Note (Signed)
ANTERIOR APPROACH LEFT HEMI HIP ARTHROPLASTY  Procedure Note Jamie Daugherty   [id number]  Pre-op Diagnosis: left hip fracture     Post-op Diagnosis: same   Operative Procedures  1. Prosthetic replacement for femoral neck fracture. CPT [id number]  Personnel  Surgeon(s): Leandrew Koyanagi, MD   Anesthesia: general  Prosthesis: Depuy Femur: Corail KA 15 Head: 47 mm size: +5 Bearing Type: Bipolar  Date of Service: 04/16/2015 - 04/17/2015  Hip Hemiarthroplasty (Anterior Approach) Op Note:  After informed consent was obtained and the operative extremity marked in the holding area, the patient was brought back to the operating room and placed supine on the HANA table. Next, the operative extremity was prepped and draped in normal sterile fashion. Surgical timeout occurred verifying patient identification, surgical site, surgical procedure and administration of antibiotics.  A modified anterior Smith-Peterson approach to the hip was performed, using the interval between tensor fascia lata and sartorius.  Dissection was carried bluntly down onto the anterior hip capsule. The lateral femoral circumflex vessels were identified and coagulated. A capsulotomy was performed and the capsular flaps tagged for later repair.  Fluoroscopy was utilized to prepare for the femoral neck cut. The neck osteotomy was performed. The femoral head was removed and found a 47 mm head was the appropriate fit.    We then turned our attention to the femur.  After placing the femoral hook, the leg was taken to externally rotated, extended and adducted position taking care to perform soft tissue releases to allow for adequate mobilization of the femur. Soft tissue was cleared from the shoulder of the greater trochanter and the hook elevator used to improve exposure of the proximal femur. Sequential broaching performed up to a size 15. Trial neck and head were placed. The leg was brought back up to neutral and the construct reduced. The  position and sizing of components, offset and leg lengths were checked using fluoroscopy. Stability of the construct was checked in extension and external rotation without any subluxation or impingement of prosthesis. We dislocated the prosthesis, dropped the leg back into position, removed trial components, and irrigated copiously. The final stem and head was then placed, the leg brought back up, the system reduced and fluoroscopy used to verify positioning.  We irrigated, obtained hemostasis and closed the capsule using #2 ethibond suture.  The fascia was closed with #1 vicryl plus, the deep fat layer was closed with 0 vicryl, the subcutaneous layers closed with 2.0 Vicryl Plus and the skin closed with staples. A sterile dressing was applied. The patient was awakened in the operating room and taken to recovery in stable condition. All sponge, needle, and instrument counts were correct at the end of the case.   Position: supine  Complications: none.  Time Out: performed   Drains/Packing: none  Estimated blood loss: 200  Returned to Recovery Room: in good condition.   Antibiotics: yes   Mechanical VTE (DVT) Prophylaxis: sequential compression devices, TED thigh-high  Chemical VTE (DVT) Prophylaxis: lovenox  Fluid Replacement: Crystalloid: see anesthesia record  Specimens Removed: 1 to pathology   Sponge and Instrument Count Correct? yes   PACU: portable radiograph - low AP   Admission: inpatient status, start PT & OT POD#1  Plan/RTC: Return in 2 weeks for staple removal. Return in 6 weeks to see MD.  Weight Bearing/Load Lower Extremity: full  Hip precautions: none Suture Removal: 10-14 days  Betadine to incision twice daily once dressing is removed on POD#7  N. Eduard Roux, MD  [place] 5:25 PM     Implant Name Type Inv. Item Serial No. Manufacturer Lot No. LRB No. Used  STEM CORAIL KA15 - [id number] Stem STEM CORAIL KA15  DEPUY [id number] Left 1  HIP  BALL ARTICU DEPUY - [id number] Hips HIP BALL ARTICU DEPUY  DEPUY [id number] Left 1  BIPOLAR DEPUY 47MM - [id number] Hips BIPOLAR DEPUY 47MM   DEPUY [id number] Left 1

## 2015-04-17 NOTE — Anesthesia Preprocedure Evaluation (Addendum)
Anesthesia Evaluation  Patient identified by MRN, date of birth, ID band Patient awake    Reviewed: Allergy & Precautions, H&P , NPO status , Patient's Chart, lab work & pertinent test results, reviewed documented beta blocker date and time   Airway Mallampati: II  TM Distance: >3 FB Neck ROM: full    Dental no notable dental hx. (+) Dental Advisory Given, Teeth Intact   Pulmonary neg pulmonary ROS, former smoker,    Pulmonary exam normal breath sounds clear to auscultation       Cardiovascular hypertension, Pt. on home beta blockers and Pt. on medications +CHF  Normal cardiovascular exam+ dysrhythmias Atrial Fibrillation + pacemaker  Rhythm:regular Rate:Normal  LBBB  EF 30 - 35%   Neuro/Psych negative neurological ROS  negative psych ROS   GI/Hepatic negative GI ROS, Neg liver ROS,   Endo/Other  negative endocrine ROS  Renal/GU negative Renal ROS  negative genitourinary   Musculoskeletal   Abdominal   Peds  Hematology negative hematology ROS (+)   Anesthesia Other Findings   Reproductive/Obstetrics negative OB ROS                          Anesthesia Physical Anesthesia Plan  ASA: III  Anesthesia Plan: General   Post-op Pain Management:    Induction: Intravenous  Airway Management Planned: Oral ETT  Additional Equipment:   Intra-op Plan:   Post-operative Plan: Extubation in OR  Informed Consent: I have reviewed the patients History and Physical, chart, labs and discussed the procedure including the risks, benefits and alternatives for the proposed anesthesia with the patient or authorized representative who has indicated his/her understanding and acceptance.   Dental Advisory Given  Plan Discussed with: CRNA and Surgeon  Anesthesia Plan Comments:         Anesthesia Quick Evaluation

## 2015-04-18 ENCOUNTER — Inpatient Hospital Stay (HOSPITAL_COMMUNITY): Payer: Medicare Other

## 2015-04-18 ENCOUNTER — Encounter (HOSPITAL_COMMUNITY): Payer: Self-pay | Admitting: Orthopaedic Surgery

## 2015-04-18 LAB — CBC
HEMATOCRIT: 31.2 % — AB (ref 36.0–46.0)
Hemoglobin: 10.5 g/dL — ABNORMAL LOW (ref 12.0–15.0)
MCH: 30 pg (ref 26.0–34.0)
MCHC: 33.7 g/dL (ref 30.0–36.0)
MCV: 89.1 fL (ref 78.0–100.0)
Platelets: 193 10*3/uL (ref 150–400)
RBC: 3.5 MIL/uL — ABNORMAL LOW (ref 3.87–5.11)
RDW: 15 % (ref 11.5–15.5)
WBC: 8.5 10*3/uL (ref 4.0–10.5)

## 2015-04-18 LAB — BASIC METABOLIC PANEL
ANION GAP: 10 (ref 5–15)
BUN: 17 mg/dL (ref 6–20)
CALCIUM: 7.8 mg/dL — AB (ref 8.9–10.3)
CO2: 23 mmol/L (ref 22–32)
Chloride: 100 mmol/L — ABNORMAL LOW (ref 101–111)
Creatinine, Ser: 0.9 mg/dL (ref 0.44–1.00)
GFR calc non Af Amer: 53 mL/min — ABNORMAL LOW (ref >60)
Glucose, Bld: 102 mg/dL — ABNORMAL HIGH (ref 65–99)
POTASSIUM: 3.4 mmol/L — AB (ref 3.5–5.1)
Sodium: 133 mmol/L — ABNORMAL LOW (ref 135–145)

## 2015-04-18 MED ORDER — SODIUM CHLORIDE 0.9 % IV BOLUS (SEPSIS)
250.0000 mL | Freq: Once | INTRAVENOUS | Status: AC
  Administered 2015-04-18: 250 mL via INTRAVENOUS

## 2015-04-18 NOTE — Progress Notes (Signed)
[facility] PROGRESS NOTE  Jamie Daugherty [id number] DOB: June 13, 1918 DOA: 04/16/2015 PCP: Stephens Shire, MD  HPI/Brief narrative 82 y.o. female with a history of Paroxsymal Atrial Fibrillation, Systolic CHF, HTN who presents to the ED with complaints of tripping and falling on the carpet in her home last night. In the AM, she had increased left hip pain and was not able to bear weight on her left leg. She was evaluated in the ED and was found to have a Left Femoral Neck Fracture on X-ray, and Orthopedics Dr Tomie China was consulted. She was referred for admission with a plan for probable surgery in the AM. She is not on any blood thinners  Assessment/Plan: 1. Left hip fracture 1. Orthopedic surgery consulted 2. Patient planned for surgery today   2. PAF 1. CHADS-VASc of at least 5 2. Pt reportedly taken off anticoagulation secondary to severe epistaxis 3. Currently rate controlled  4. S/p pacemaker 3. Hypokalemia 1. Replaced 2. Continue to follow lytes 4. Chronic systolic CHF 1. Clinically euvolemic 2. Last EF of 30-35% in 11/16 3. Followed by Dr. Sallyanne Kuster as outpatient 4. Checking cxr to confirm clear lungs 5. HTN 1. BP stable 6. DVT prophylaxis 1. SCD's 7. Orthostatic Hypotension 1. Pt with brief syncopal episode on standing with PT this afternoon 2. Pt's vitals confirms orthostasis 3. Pt has been continued on 125cc IVF 4. Will give 250cc bolus, keeping in mind patient's baseline EF of around 30%. Will check CXR to confirm clear lungs given continuous IVF thus far  Code Status: Full Family Communication: Pt in room Disposition Plan: Plan SNF post-op   Consultants:  Orthopedic Surgery  Procedures:    Antibiotics: Anti-infectives    Start     Dose/Rate Route Frequency Ordered Stop   04/17/15 2200  ceFAZolin (ANCEF) IVPB 2 g/50 mL premix     2 g 100 mL/hr over 30 Minutes Intravenous Every 6 hours 04/17/15 1852 04/18/15 1125   04/17/15 1630  [MAR Hold]   ceFAZolin (ANCEF) IVPB 2 g/50 mL premix     (MAR Hold since 04/17/15 1439)   2 g 100 mL/hr over 30 Minutes Intravenous To [facility] 04/17/15 0239 04/17/15 1630   04/16/15 2000  ceFAZolin (ANCEF) IVPB 2 g/50 mL premix    Comments:  Anesthesia to give preop   2 g 100 mL/hr over 30 Minutes Intravenous  Once 04/16/15 1946 04/16/15 2104      HPI/Subjective: No chest pains. In good spirits this  AM  Objective: Filed Vitals:   04/18/15 0100 04/18/15 0500 04/18/15 1450 04/18/15 1455  BP: 160/70 130/54 124/45 68/41  Pulse: 70 77 70   Temp: 97.3 F (36.3 C) 99.7 F (37.6 C) 97.7 F (36.5 C)   TempSrc:   Oral   Resp: 16 16 17    SpO2: 100% 99% 99% 99%    Intake/Output Summary (Last 24 hours) at 04/18/15 1555 Last data filed at 04/18/15 1529  Gross per 24 hour  Intake   2260 ml  Output    250 ml  Net   2010 ml   There were no vitals filed for this visit.  Exam:   General:  Awake, in nad, laying in bed  Cardiovascular: regular, s1, s2  Respiratory: normal resp effort, no wheezing  Abdomen: soft, nondistended, pos BS  Musculoskeletal: perfused, no clubbing   Data Reviewed: Basic Metabolic Panel:  Recent Labs Lab 04/16/15 1930 04/17/15 0325 04/18/15 0627  NA 136 136 133*  K 3.7 3.1* 3.4*  CL  98* 100* 100*  CO2 25 25 23   GLUCOSE 113* 99 102*  BUN 14 10 17   CREATININE 0.81 0.70 0.90  CALCIUM 9.3 8.4* 7.8*   Liver Function Tests: No results for input(s): AST, ALT, ALKPHOS, BILITOT, PROT, ALBUMIN in the last 168 hours. No results for input(s): LIPASE, AMYLASE in the last 168 hours. No results for input(s): AMMONIA in the last 168 hours. CBC:  Recent Labs Lab 04/16/15 1930 04/17/15 0325 04/18/15 0627  WBC 10.0 7.4 8.5  NEUTROABS 8.2*  --   --   HGB 12.7 12.2 10.5*  HCT 39.1 36.5 31.2*  MCV 88.9 87.7 89.1  PLT 223 198 193   Cardiac Enzymes: No results for input(s): CKTOTAL, CKMB, CKMBINDEX, TROPONINI in the last 168 hours. BNP (last 3  results) No results for input(s): BNP in the last 8760 hours.  ProBNP (last 3 results) No results for input(s): PROBNP in the last 8760 hours.  CBG: No results for input(s): GLUCAP in the last 168 hours.  Recent Results (from the past 240 hour(s))  Surgical PCR screen     Status: None   Collection Time: 04/17/15  4:36 AM  Result Value Ref Range Status   MRSA, PCR NEGATIVE NEGATIVE Final   Staphylococcus aureus NEGATIVE NEGATIVE Final    Comment:        The Xpert SA Assay (FDA approved for NASAL specimens in patients over 51 years of age), is one component of a comprehensive surveillance program.  Test performance has been validated by [facility] for patients greater than or equal to 63 year old. It is not intended to diagnose infection nor to guide or monitor treatment.      Studies: Pelvis Portable  04/17/2015  CLINICAL DATA:  82 year old female status post left hip replacement. EXAM: PORTABLE PELVIS 1-2 VIEWS COMPARISON:  Left hip radiograph dated 04/16/2015 and fluoroscopic images dated 04/17/2015 FINDINGS: There has been interval placement of a total left arthroplasty. There is no acute fracture or dislocation. The bones are osteopenic. There are osteoarthritic changes of the hip joints. Soft tissue gas in the left hip area consistent with postsurgical changes. IMPRESSION: Status post total left hip arthroplasty. No acute fracture or dislocation. Electronically Signed   By: Anner Crete M.D.   On: 04/17/2015 18:38   Dg Hip Operative Unilat With Pelvis Left  04/17/2015  CLINICAL DATA:  Left hip arthroplasty EXAM: OPERATIVE LEFT HIP (WITH PELVIS IF PERFORMED) 3 VIEWS TECHNIQUE: Fluoroscopic spot image(s) were submitted for interpretation post-operatively. COMPARISON:  04/16/2015 FINDINGS: Spot films demonstrate interval left hip hemi arthroplasty would which appears located on this single view. No complicating features are identified. IMPRESSION: Left hip hemiarthroplasty  without complicating features. Electronically Signed   By: Margarette Canada M.D.   On: 04/17/2015 17:27   Dg Hip Unilat With Pelvis 2-3 Views Left  04/16/2015  CLINICAL DATA:  Family states that today they think pt fell and injured her left hip, she states she fell while walking yesterday. Pt normally ambulates independently but cannot put any weight on her left hip today. EXAM: DG HIP (WITH OR WITHOUT PELVIS) 2-3V LEFT COMPARISON:  None. FINDINGS: There is a left subcapital femoral neck fracture, non comminuted and without significant displacement. There is mild valgus angulation. No other fractures. Hip joints, SI joints and symphysis pubis are normally aligned. Bones are diffusely demineralized. IMPRESSION: Subcapital left femoral neck fracture with valgus angulation. Electronically Signed   By: Lajean Manes M.D.   On: 04/16/2015 17:57  Scheduled Meds: . amiodarone  200 mg Oral Daily  . atenolol  50 mg Oral Daily  . diltiazem  120 mg Oral Daily  . enoxaparin (LOVENOX) injection  40 mg Subcutaneous Q24H  . levothyroxine  75 mcg Oral QAC breakfast   Continuous Infusions: . sodium chloride 50 mL/hr at 04/16/15 2315  . sodium chloride    . lactated ringers Stopped (04/17/15 1745)    Principal Problem:   Hip fracture (HCC) Active Problems:   PAF (paroxysmal atrial fibrillation) (HCC)   Chronic systolic heart failure ([place])   Hypertension   Ginette Pitman, [place] [facility] Pager [phone]. If 7PM-7AM, please contact night-coverage at [email or web address], password [email or web address] 04/18/2015, 3:55 PM  LOS: 2 days

## 2015-04-18 NOTE — Progress Notes (Signed)
   Subjective:  Patient reports pain as mild.    Objective:   VITALS:   Filed Vitals:   04/17/15 1830 04/17/15 1857 04/18/15 0100 04/18/15 0500  BP: 158/85 158/72 160/70 130/54  Pulse: 70 70 70 77  Temp:  97.5 F (36.4 C) 97.3 F (36.3 C) 99.7 F (37.6 C)  TempSrc:      Resp: 11 16 16 16   SpO2: 100% 100% 100% 99%    Neurologically intact Neurovascular intact Sensation intact distally Intact pulses distally Dorsiflexion/Plantar flexion intact Incision: dressing C/D/I and no drainage No cellulitis present Compartment soft   Lab Results  Component Value Date   WBC 7.4 04/17/2015   HGB 12.2 04/17/2015   HCT 36.5 04/17/2015   MCV 87.7 04/17/2015   PLT 198 04/17/2015     Assessment/Plan:  1 Day Post-Op   - Expected postop acute blood loss anemia - will monitor for symptoms - Up with PT/OT - DVT ppx - SCDs, ambulation, lovenox - WBAT operative extremity - Pain control - Discharge planning  Marianna Payment 04/18/2015, 7:08 AM [phone]

## 2015-04-18 NOTE — Evaluation (Signed)
Physical Therapy Evaluation Patient Details Name: Jamie Daugherty MRN: [id number] DOB: 04-17-1918 Today's Date: 04/18/2015   History of Present Illness  82 y.o. female s/p prosthetic replacement for L femoral neck fx after mechanical fall at home. PMH significant for HTN, pacemaker, syncope, atrial fibrillation, and mild dementia.  Clinical Impression  Patient demonstrates deficits in functional mobility as indicated below. Will need continued skilled PT to address deficits and maximize function. Will see as indicated and progress as tolerated. Pt currently presents with generalized weakness, acute L hip pain, orthostatic hypotension with resulting loss of consciousness while ambulating. While taking pivotal steps to chair, pt leaned forward and became unresponsive to commands and required total +2 assist to transfer to chair. Pt immediately placed in trendelenburg position and returned to supplemental O2 - pt's BP 94/48 HR 83 SaO2 82% on RA while reclined back in chair with feet raised. BP increased to 143/70 HR 71 SaO2 94% on 4L O2 after 5 minutes in trendelenburg position and pt was increasingly responsive to commands and conversation. RN notified of incident. Patient will need ST SNF upon acute discharge.    Follow Up Recommendations SNF;Supervision/Assistance - 24 hour    Equipment Recommendations   (TBD at next venue)    Recommendations for Other Services       Precautions / Restrictions Precautions Precautions: Fall Restrictions Weight Bearing Restrictions: Yes LLE Weight Bearing: Weight bearing as tolerated      Mobility  Bed Mobility Overal bed mobility: Needs Assistance Bed Mobility: Supine to Sit     Supine to sit: Min assist;HOB elevated     General bed mobility comments: HOB slightly elevated and use of bedrails. Min assist to fully progress LLE off bed and from trunk support to come to sitting position. Pt able to hook R foot under LLE and arms to move LLE towards  EOB  Transfers Overall transfer level: Needs assistance Equipment used: Rolling Case (2 wheeled) Transfers: Sit to/from [employer] (Simultaneous filing. User may not have seen previous data.) Sit to Stand: Min assist;Total assist;+2 physical assistance (Simultaneous filing. User may not have seen previous data.) Stand pivot transfers: Total assist;+2 physical assistance (Simultaneous filing. User may not have seen previous data.)       General transfer comment: Min +2 assist for boost to stand and to stabilize balance. Pt began to lean forward and pt's daughter-in-law stated "I think she's about to pass out." Pt was unresponsive to commands and required total assist to sit down in chair and therapists immediately reclined pt in chair to trendelenberg position. Pt continued to be unresponsive to name, commands and pupils constricted. As pt became more responsive, BP 94/48 HR 83; after 5 minutes in trendelenberg position BP 143/70 HR 71; after 10 minutes, pt increasingly more responsive and sitting up BP 132/76 HR 72. (Simultaneous filing. User may not have seen previous data.)  Ambulation/Gait                Stairs            Wheelchair Mobility    Modified Rankin (Stroke Patients Only)       Balance Overall balance assessment: Needs assistance Sitting-balance support: No upper extremity supported;Feet supported Sitting balance-Leahy Scale: Fair     Standing balance support: Bilateral upper extremity supported;During functional activity Standing balance-Leahy Scale: Zero (after severe drop in BP ) Standing balance comment: See "transfer" section of note  Pertinent Vitals/Pain Pain Assessment: 0-10 Pain Score: 5  Pain Location: L hip and thigh Pain Descriptors / Indicators: Aching;Sore Pain Intervention(s): Limited activity within patient's tolerance;Monitored during session;Repositioned;Premedicated before  session    [place] expects to be discharged to:: Private residence Living Arrangements: Alone Available Help at Discharge: Personal care attendant (M-F 7-1) Type of Home: Apartment Home Access: Level entry     Home Layout: One level Home Equipment: [place]g - standard;Bedside commode;Shower seat;Grab bars - tub/shower;Hand held shower head;Transport chair      Prior Function Level of Independence: Needs assistance   Gait / Transfers Assistance Needed: None  ADL's / Homemaking Assistance Needed: Supervision from PCA to get in/out of tub; assist with meals  Comments: Drives     Hand Dominance   Dominant Hand: Right    Extremity/Trunk Assessment   Upper Extremity Assessment: Generalized weakness           Lower Extremity Assessment: LLE deficits/detail;Generalized weakness   LLE Deficits / Details: decreased ROM and strength as expected with injury and post op  Cervical / Trunk Assessment: Kyphotic  Communication   Communication: No difficulties  Cognition Arousal/Alertness: Awake/alert Behavior During Therapy: WFL for tasks assessed/performed Overall Cognitive Status: History of cognitive impairments - at baseline       Memory: Decreased short-term memory              General Comments      Exercises        Assessment/Plan    PT Assessment Patient needs continued PT services  PT Diagnosis Difficulty walking;Abnormality of gait;Generalized weakness;Acute pain;Altered mental status   PT Problem List Decreased strength;Decreased activity tolerance;Decreased balance;Decreased mobility;Decreased coordination;Decreased cognition;Decreased knowledge of use of DME;Decreased safety awareness;Decreased knowledge of precautions;Cardiopulmonary status limiting activity;Pain  PT Treatment Interventions DME instruction;Gait training;Functional mobility training;Therapeutic activities;Therapeutic exercise;Balance training;Patient/family education    PT Goals (Current goals can be found in the Care Plan section) Acute Rehab PT Goals Patient Stated Goal: none stated (Simultaneous filing. User may not have seen previous data.) PT Goal Formulation: With patient/family Time For Goal Achievement: 05/02/15 Potential to Achieve Goals: Good    Frequency 7X/week   Barriers to discharge        Co-evaluation PT/OT/SLP Co-Evaluation/Treatment: Yes Reason for Co-Treatment: Complexity of the patient's impairments (multi-system involvement);For patient/therapist safety PT goals addressed during session: Mobility/safety with mobility OT goals addressed during session: ADL's and self-care;Proper use of Adaptive equipment and DME       End of Session Equipment Utilized During Treatment: Gait belt;Oxygen Activity Tolerance: Patient limited by fatigue;Patient limited by pain;Treatment limited secondary to medical complications (Comment) (syncope, unresponsiveness post OOB) Patient left: in chair;with call bell/phone within reach;with chair alarm set;with family/visitor present Nurse Communication: Mobility status;Other (comment) (syncope)         Time: [id number] PT Time Calculation (min) (ACUTE ONLY): 44 min   Charges:   PT Evaluation $PT Eval Moderate Complexity: 1 Procedure     PT G CodesDuncan Dull 04-22-15, 4:53 PM Alben Deeds, [place] DPT  [phone]

## 2015-04-18 NOTE — Care Management Important Message (Signed)
Important Message  Patient Details  Name: Jamie Daugherty MRN: [id number] Date of Birth: 1918-03-18   Medicare Important Message Given:  Yes    Treshaun Carrico P [place] 04/18/2015, 4:09 PM

## 2015-04-18 NOTE — Progress Notes (Signed)
Occupational Therapy Evaluation Patient Details Name: Jamie Daugherty MRN: [id number] DOB: Aug 27, 1918 Today's Date: 04/18/2015    History of Present Illness 82 y.o. female s/p prosthetic replacement for L femoral neck fx after mechanical fall at home. PMH significant for HTN, pacemaker, syncope, atrial fibrillation, and mild dementia.   Clinical Impression   PTA, pt was independent with mobility and required supervision assist for showering and assist with meals from PCA. Pt currently presents with generalized weakness, acute L hip pain, orthostatic hypotension with resulting loss of consciousness while ambulating. While taking pivotal steps to chair, pt leaned forward and became unresponsive to commands and required total +2 assist to transfer to chair. Pt immediately placed in trendelenburg position and returned to supplemental O2 - pt's BP 94/48 HR 83 SaO2 82% on RA while reclined back in chair with feet raised. BP increased to 143/70 HR 71 SaO2 94% on 4L O2 after 5 minutes in trendelenburg position and pt was increasingly responsive to commands and conversation. RN notified of incident. Recommending SNF for post-acute rehab stay to maximize pt's independence and safety with ADLs and mobility before returning home due to pt living alone and BP drop/syncope episode. Will continue to follow acutely.    Follow Up Recommendations  SNF;Supervision/Assistance - 24 hour    Equipment Recommendations  Other (comment) (TBD in next venue)    Recommendations for Other Services       Precautions / Restrictions Precautions Precautions: Fall Restrictions Weight Bearing Restrictions: Yes LLE Weight Bearing: Weight bearing as tolerated      Mobility Bed Mobility Overal bed mobility: Needs Assistance Bed Mobility: Supine to Sit     Supine to sit: Min assist;HOB elevated     General bed mobility comments: HOB slightly elevated and use of bedrails. Min assist to fully progress LLE off bed and  from trunk support to come to sitting position. Pt able to hook R foot under LLE and arms to move LLE towards EOB  Transfers Overall transfer level: Needs assistance Equipment used: Rolling Garringer (2 wheeled) Transfers: Sit to/from [facility] Sit to Stand: Min assist;Total assist;+2 physical assistance  Stand pivot transfers: Total assist;+2 physical assistance       General transfer comment: Min +2 assist for boost to stand and to stabilize balance. Pt began to lean forward and pt's daughter-in-law stated "I think she's about to pass out." Pt was unresponsive to commands and required total assist to sit down in chair and therapists immediately reclined pt in chair to trendelenberg position. Pt continued to be unresponsive to name, commands and pupils constricted. As pt became more responsive, BP 94/48 HR 83; after 5 minutes in trendelenberg position BP 143/70 HR 71; after 10 minutes, pt increasingly more responsive and sitting up BP 132/76 HR 72.    Balance Overall balance assessment: Needs assistance Sitting-balance support: No upper extremity supported;Feet supported Sitting balance-Leahy Scale: Fair     Standing balance support: Bilateral upper extremity supported;During functional activity Standing balance-Leahy Scale: Zero (after severe drop in BP ) Standing balance comment: See "transfer" section of note                            ADL Overall ADL's : Needs assistance/impaired                 Upper Body Dressing : Set up;Sitting   Lower Body Dressing: Maximal assistance;Sit to/from stand Lower Body Dressing Details (indicate cue type and reason):  Unable to reach bilateral feet or cross ankle-over-knee for dressing tasks Toilet Transfer: Total assistance;+2 for physical assistance;Ambulation;RW Toilet Transfer Details (indicate cue type and reason): simulated to chair Toileting- Clothing Manipulation and Hygiene: Total assistance;+2 for physical  assistance;Sit to/from stand Toileting - Clothing Manipulation Details (indicate cue type and reason): Pt with orthostatic hypotension upon standing and attempting to ambulate. Required total assist to stand-pivot transfer to chair. Pt's daughter-in-law reports that pt has been dealing with fainting/passing out issue for several years.       General ADL Comments: Pt's daughter-in-law present for OT eval.     Vision Vision Assessment?: No apparent visual deficits   Perception     Praxis      Pertinent Vitals/Pain Pain Assessment: 0-10 Pain Score: 5  Pain Location: L hip and thigh Pain Descriptors / Indicators: Aching;Sore Pain Intervention(s): Limited activity within patient's tolerance;Monitored during session;Repositioned;Premedicated before session     Hand Dominance Right   Extremity/Trunk Assessment Upper Extremity Assessment Upper Extremity Assessment: Generalized weakness   Lower Extremity Assessment Lower Extremity Assessment: LLE deficits/detail;Generalized weakness LLE Deficits / Details: decreased ROM and strength as expected with injury and post op LLE: Unable to fully assess due to pain   Cervical / Trunk Assessment Cervical / Trunk Assessment: Kyphotic   Communication Communication Communication: No difficulties   Cognition Arousal/Alertness: Awake/alert Behavior During Therapy: WFL for tasks assessed/performed Overall Cognitive Status: History of cognitive impairments - at baseline       Memory: Decreased short-term memory             General Comments       Exercises       Shoulder Instructions      Home Living Family/patient expects to be discharged to:: Private residence Living Arrangements: Alone Available Help at Discharge: Personal care attendant (M-F 7-1) Type of Home: Apartment Home Access: Level entry     Home Layout: One level     Bathroom Shower/Tub: Tub/shower unit;Curtain Shower/tub characteristics: Primary school teacher: Standard Bathroom Accessibility: Yes How Accessible: Accessible via [place] Home Equipment: [place] - standard;Bedside commode;Shower seat;Grab bars - tub/shower;Hand held shower head;Transport chair          Prior Functioning/Environment Level of Independence: Needs assistance  Gait / Transfers Assistance Needed: None ADL's / Homemaking Assistance Needed: Supervision from PCA to get in/out of tub; assist with meals   Comments: Drives    OT Diagnosis: Generalized weakness;Acute pain;Cognitive deficits   OT Problem List: Decreased strength;Decreased range of motion;Decreased activity tolerance;Impaired balance (sitting and/or standing);Decreased coordination;Decreased safety awareness;Decreased knowledge of use of DME or AE;Decreased knowledge of precautions;Pain   OT Treatment/Interventions: Self-care/ADL training;Therapeutic exercise;Energy conservation;DME and/or AE instruction;Therapeutic activities;Balance training;Patient/family education    OT Goals(Current goals can be found in the care plan section) Acute Rehab OT Goals Patient Stated Goal: none stated (Simultaneous filing. User may not have seen previous data.) OT Goal Formulation: With patient Time For Goal Achievement: 05/02/15 Potential to Achieve Goals: Good ADL Goals Pt Will Perform Grooming: with set-up;sitting Pt Will Perform Lower Body Bathing: with min assist;with adaptive equipment;sitting/lateral leans;sit to/from stand Pt Will Perform Lower Body Dressing: with min assist;sitting/lateral leans;with adaptive equipment;sit to/from stand Pt Will Transfer to Toilet: with min assist;squat pivot transfer;bedside commode Pt Will Perform Toileting - Clothing Manipulation and hygiene: with min assist;sitting/lateral leans;sit to/from stand  OT Frequency: Min 2X/week   Barriers to D/C: Decreased caregiver support  Lives alone and has no 24/7 supervision. PCA daily  Co-evaluation PT/OT/SLP  Co-Evaluation/Treatment: Yes Reason for Co-Treatment: Complexity of the patient's impairments (multi-system involvement);For patient/therapist safety PT goals addressed during session: Mobility/safety with mobility OT goals addressed during session: ADL's and self-care;Proper use of Adaptive equipment and DME      End of Session Equipment Utilized During Treatment: Gait belt;Rolling Fetherolf Nurse Communication: Mobility status;Other (comment) (pt orthostatic BP and lost consciousness with mobilization)  Activity Tolerance: Treatment limited secondary to medical complications (Comment) (orthostatic BP and loss of consciousness) Patient left: in chair;with call bell/phone within reach;with chair alarm set;with family/visitor present   Time: [id number] OT Time Calculation (min): 45 min Charges:  OT General Charges $OT Visit: 1 Procedure OT Evaluation $OT Eval Moderate Complexity: 1 Procedure G-Codes:    Redmond Baseman, OTR/L [phone] 04/18/2015, 2:06 PM

## 2015-04-18 NOTE — Progress Notes (Signed)
Orthopedic Tech Progress Note Patient Details:  Jamie Daugherty [birth year] [id number]  Patient ID: [name], female   DOB: July 21, 1918, 82 y.o.   MRN: [id number]   [place] Pink 04/18/2015, 12:00 PMTrapeze bar

## 2015-04-18 NOTE — Clinical Social Work Note (Signed)
Clinical Social Work Assessment  Patient Details  Name: Jamie Daugherty MRN: [id number] Date of Birth: 1918/11/11  Date of referral:  04/18/15               Reason for consult:  Facility Placement                Permission sought to share information with:  Family Supports Permission granted to share information::  Yes, Release of Information Signed (HCPOA paperwork)  Name::     Alvester Chou  Agency::  [facility]  Relationship::     Contact Information:     Housing/Transportation Living arrangements for the past 2 months:  Single Family Home Source of Information:  Adult Children Patient Interpreter Needed:  None Criminal Activity/Legal Involvement Pertinent to Current Situation/Hospitalization:  No - Comment as needed Significant Relationships:  Adult Children Lives with:    Do you feel safe going back to the place where you live?  No Need for family participation in patient care:  Yes (Comment) (decision making)  Care giving concerns:  Pt does not have sufficient support following surgery   Social Worker assessment / plan:  CSW spoke with pt son concerning recommendation for SNF by MD- pt son reports that pt went to SNF in the past and had positive experience- son is aware of referral process  Employment status:  Retired Forensic scientist:  Medicare PT Recommendations:  [place] / Referral to community resources:  [place]  Patient/Family's Response to care:  Pt son is agreeable to short term placement- prefers [facility] or [facility]  Patient/Family's Understanding of and Emotional Response to Diagnosis, Current Treatment, and Prognosis:  No questions or concerns. Son is hopeful that pt will recover quickly and be able to return home.  Emotional Assessment Appearance:  Appears stated age Attitude/Demeanor/Rapport:    Affect (typically observed):    Orientation:  Oriented to Self, Oriented to Place, Oriented to  Time,  Oriented to Situation Alcohol / Substance use:  Not Applicable Psych involvement (Current and /or in the community):     Discharge Needs  Concerns to be addressed:  Care Coordination Readmission within the last 30 days:  No Current discharge risk:  Physical Impairment Barriers to Discharge:  Continued Medical Work up, Programmer, applications ([place])   Cranford Mon, LCSW 04/18/2015, 3:04 PM

## 2015-04-18 NOTE — NC FL2 (Signed)
[place] LEVEL OF CARE SCREENING TOOL     IDENTIFICATION  Patient Name: Jamie Daugherty Birthdate: Nov 26, 1918 Sex: female Admission Date (Current Location): 04/16/2015  [facility] and [place] Number:  Herbalist and Address:  [facility], [place] [address], [place], [place] [address]      Provider Number: [id number]  Attending Physician Name and Address:  Donne Hazel, MD  Relative Name and Phone Number:       Current Level of Care: Hospital Recommended Level of Care: [employer] Prior Approval Number:    Date Approved/Denied:   PASRR Number:    Discharge Plan: SNF    Current Diagnoses: Patient Active Problem List   Diagnosis Date Noted  . Hip fracture ([place]) 04/16/2015  . Hypertension 04/16/2015  . Fall 04/16/2015  . Chronic systolic heart failure ([place]) 12/25/2014  . Anemia 10/20/2014  . Anemia due to blood loss, acute 10/19/2014  . Protein-calorie malnutrition, severe ([place]) 10/16/2014  . Posterior epistaxis 10/15/2014  . Syncope 10/15/2014  . Dementia without behavioral disturbance 10/15/2014  . Laceration of right upper arm 10/15/2014  . Epistaxis   . Persistent atrial fibrillation ([place]) 09/17/2014  . Pacemaker - dual chamber [facility] implanted 2013 01/25/2013  . Chronic anticoagulation, on Xarelto 11/20/2012  . PAF (paroxysmal atrial fibrillation) ([place]) 11/06/2012    Orientation RESPIRATION BLADDER Height & Weight     Self, Time, Situation, Place  O2 () Continent Weight:   Height:     BEHAVIORAL SYMPTOMS/MOOD NEUROLOGICAL BOWEL NUTRITION STATUS      Continent Diet (see DC summary)  AMBULATORY STATUS COMMUNICATION OF NEEDS Skin   Extensive Assist Verbally Surgical wounds                       Personal Care Assistance Level of Assistance  Dressing, Bathing Bathing Assistance: Maximum assistance   Dressing Assistance: Maximum assistance     Functional Limitations Info              SPECIAL CARE FACTORS FREQUENCY  PT (By licensed PT), OT (By licensed OT)     PT Frequency: 5/wk OT Frequency: 5/wk            Contractures      Additional Factors Info  Code Status, Allergies Code Status Info: FULL Allergies Info: Penicillins           Current Medications (04/18/2015):  This is the current hospital active medication list Current Facility-Administered Medications  Medication Dose Route Frequency Provider Last Rate Last Dose  . 0.9 %  sodium chloride infusion   Intravenous Continuous Theressa Millard, MD 50 mL/hr at 04/16/15 2315    . 0.9 %  sodium chloride infusion   Intravenous Continuous Naiping Ephriam Jenkins, MD      . acetaminophen (TYLENOL) tablet 650 mg  650 mg Oral Q6H PRN Naiping Ephriam Jenkins, MD       Or  . acetaminophen (TYLENOL) suppository 650 mg  650 mg Rectal Q6H PRN Naiping Ephriam Jenkins, MD      . alum & mag hydroxide-simeth (MAALOX/MYLANTA) 200-200-20 MG/5ML suspension 30 mL  30 mL Oral Q4H PRN Naiping Ephriam Jenkins, MD      . amiodarone (PACERONE) tablet 200 mg  200 mg Oral Daily Theressa Millard, MD   200 mg at 04/18/15 1055  . atenolol (TENORMIN) tablet 50 mg  50 mg Oral Daily Theressa Millard, MD   50 mg at  04/18/15 1055  . diltiazem (CARDIZEM CD) 24 hr capsule 120 mg  120 mg Oral Daily Theressa Millard, MD   120 mg at 04/18/15 1055  . enoxaparin (LOVENOX) injection 40 mg  40 mg Subcutaneous Q24H Naiping Ephriam Jenkins, MD   40 mg at 04/18/15 0804  . HYDROcodone-acetaminophen (NORCO/VICODIN) 5-325 MG per tablet 1-2 tablet  1-2 tablet Oral Q6H PRN Leandrew Koyanagi, MD   1 tablet at 04/18/15 1100  . HYDROmorphone (DILAUDID) injection 0.5-1 mg  0.5-1 mg Intravenous Q3H PRN Theressa Millard, MD   1 mg at 04/17/15 1943  . lactated ringers infusion   Intravenous Continuous Rod Mae, MD   Stopped at 04/17/15 1745  . levothyroxine (SYNTHROID, LEVOTHROID) tablet 75 mcg  75 mcg Oral QAC breakfast Theressa Millard, MD   75 mcg at 04/18/15 0617  . menthol-cetylpyridinium  (CEPACOL) lozenge 3 mg  1 lozenge Oral PRN Naiping Ephriam Jenkins, MD       Or  . phenol (CHLORASEPTIC) mouth spray 1 spray  1 spray Mouth/Throat PRN Naiping Ephriam Jenkins, MD      . methocarbamol (ROBAXIN) tablet 500 mg  500 mg Oral Q6H PRN Naiping Ephriam Jenkins, MD       Or  . methocarbamol (ROBAXIN) 500 mg in dextrose 5 % 50 mL IVPB  500 mg Intravenous Q6H PRN Naiping Ephriam Jenkins, MD      . metoCLOPramide (REGLAN) tablet 5-10 mg  5-10 mg Oral Q8H PRN Naiping Ephriam Jenkins, MD       Or  . metoCLOPramide (REGLAN) injection 5-10 mg  5-10 mg Intravenous Q8H PRN Leandrew Koyanagi, MD   10 mg at 04/17/15 1934  . morphine 2 MG/ML injection 0.5 mg  0.5 mg Intravenous Q2H PRN Naiping Ephriam Jenkins, MD      . morphine 4 MG/ML injection 4 mg  4 mg Intravenous Q30 min PRN Dorie Rank, MD      . ondansetron [facility]) tablet 4 mg  4 mg Oral Q6H PRN Naiping Ephriam Jenkins, MD       Or  . ondansetron [facility]) injection 4 mg  4 mg Intravenous Q6H PRN Leandrew Koyanagi, MD   4 mg at 04/17/15 1836  . oxyCODONE (Oxy IR/ROXICODONE) immediate release tablet 5 mg  5 mg Oral Q4H PRN Theressa Millard, MD      . oxyCODONE (Oxy IR/ROXICODONE) immediate release tablet 5-10 mg  5-10 mg Oral Q4H PRN Leandrew Koyanagi, MD   10 mg at 04/18/15 [id number]     Discharge Medications: Please see discharge summary for a list of discharge medications.  Relevant Imaging Results:  Relevant Lab Results:   Additional Information SS#: [id number]  Cranford Mon, [place]

## 2015-04-19 LAB — BASIC METABOLIC PANEL
ANION GAP: 7 (ref 5–15)
BUN: 15 mg/dL (ref 6–20)
CO2: 23 mmol/L (ref 22–32)
Calcium: 7.8 mg/dL — ABNORMAL LOW (ref 8.9–10.3)
Chloride: 100 mmol/L — ABNORMAL LOW (ref 101–111)
Creatinine, Ser: 0.77 mg/dL (ref 0.44–1.00)
GFR calc Af Amer: >60 mL/min (ref >60)
Glucose, Bld: 113 mg/dL — ABNORMAL HIGH (ref 65–99)
POTASSIUM: 3.6 mmol/L (ref 3.5–5.1)
SODIUM: 130 mmol/L — AB (ref 135–145)

## 2015-04-19 LAB — CORTISOL: CORTISOL PLASMA: 26.4 ug/dL

## 2015-04-19 NOTE — Progress Notes (Signed)
[facility] PROGRESS NOTE  Jamie Daugherty [id number] DOB: 1919/01/27 DOA: 04/16/2015 PCP: Stephens Shire, MD  HPI/Brief narrative 82 y.o. female with a history of Paroxsymal Atrial Fibrillation, Systolic CHF, HTN who presents to the ED with complaints of tripping and falling on the carpet in her home last night. In the AM, she had increased left hip pain and was not able to bear weight on her left leg. She was evaluated in the ED and was found to have a Left Femoral Neck Fracture on X-ray, and Orthopedics Dr Tomie China was consulted. She was referred for admission with a plan for probable surgery in the AM. She is not on any blood thinners  Assessment/Plan: 1. Left hip fracture 1. Orthopedic surgery consulted 2. Patient underwent prosthetic replacement for femoral neck fracture on 3/2 2. PAF 1. CHADS-VASc of at least 5 2. Pt reportedly taken off anticoagulation secondary to severe epistaxis 3. Currently rate controlled  4. S/p pacemaker 3. Hypokalemia 1. Replaced 2. Continue to follow lytes 4. Chronic systolic CHF 1. Clinically euvolemic 2. Last EF of 30-35% in 11/16 3. Followed by Dr. Sallyanne Kuster as outpatient 4. Cautiously rehydrating with IVF per below 5. HTN 1. BP stable 6. DVT prophylaxis 1. SCD's 7. Orthostatic Hypotension 1. Pt with brief syncopal episode on standing with PT on 3/3 2. Pt's vitals confirms orthostasis 3. AM cortisol wnl 4. Pt has been continued on 125cc IVF 5. Still orthostatic this AM. Continue IVF as tolerated. Repeat orthostatic vitals in AM  Code Status: Full Family Communication: Pt in room Disposition Plan: Plan SNF post-op   Consultants:  Orthopedic Surgery  Procedures:    Antibiotics: Anti-infectives    Start     Dose/Rate Route Frequency Ordered Stop   04/17/15 2200  ceFAZolin (ANCEF) IVPB 2 g/50 mL premix     2 g 100 mL/hr over 30 Minutes Intravenous Every 6 hours 04/17/15 1852 04/18/15 1125   04/17/15 1630  [MAR Hold]   ceFAZolin (ANCEF) IVPB 2 g/50 mL premix     (MAR Hold since 04/17/15 1439)   2 g 100 mL/hr over 30 Minutes Intravenous To ShortStay Surgical 04/17/15 0239 04/17/15 1630   04/16/15 2000  ceFAZolin (ANCEF) IVPB 2 g/50 mL premix    Comments:  Anesthesia to give preop   2 g 100 mL/hr over 30 Minutes Intravenous  Once 04/16/15 1946 04/16/15 2104      HPI/Subjective: Feels better. No sob  Objective: Filed Vitals:   04/19/15 0701 04/19/15 0930 04/19/15 1102 04/19/15 1300  BP: 59/29 115/51  104/56  Pulse:  70 80 71  Temp:  97.6 F (36.4 C)  97.9 F (36.6 C)  TempSrc:  Oral    Resp:  18  18  SpO2:  99% 84% 95%    Intake/Output Summary (Last 24 hours) at 04/19/15 1656 Last data filed at 04/19/15 1500  Gross per 24 hour  Intake 2071.67 ml  Output    500 ml  Net 1571.67 ml   There were no vitals filed for this visit.  Exam:   General:  Awake, in nad, laying in bed, appears comfortable  Cardiovascular: regular, s1, s2  Respiratory: normal resp effort, no wheezing  Abdomen: soft, nondistended, pos BS  Musculoskeletal: perfused, no clubbing, no cyanosis  Data Reviewed: Basic Metabolic Panel:  Recent Labs Lab 04/16/15 1930 04/17/15 0325 04/18/15 0627 04/19/15 0348  NA 136 136 133* 130*  K 3.7 3.1* 3.4* 3.6  CL 98* 100* 100* 100*  CO2 25 25 23  23  GLUCOSE 113* 99 102* 113*  BUN 14 10 17 15   CREATININE 0.81 0.70 0.90 0.77  CALCIUM 9.3 8.4* 7.8* 7.8*   Liver Function Tests: No results for input(s): AST, ALT, ALKPHOS, BILITOT, PROT, ALBUMIN in the last 168 hours. No results for input(s): LIPASE, AMYLASE in the last 168 hours. No results for input(s): AMMONIA in the last 168 hours. CBC:  Recent Labs Lab 04/16/15 1930 04/17/15 0325 04/18/15 0627  WBC 10.0 7.4 8.5  NEUTROABS 8.2*  --   --   HGB 12.7 12.2 10.5*  HCT 39.1 36.5 31.2*  MCV 88.9 87.7 89.1  PLT 223 198 193   Cardiac Enzymes: No results for input(s): CKTOTAL, CKMB, CKMBINDEX, TROPONINI in the  last 168 hours. BNP (last 3 results) No results for input(s): BNP in the last 8760 hours.  ProBNP (last 3 results) No results for input(s): PROBNP in the last 8760 hours.  CBG: No results for input(s): GLUCAP in the last 168 hours.  Recent Results (from the past 240 hour(s))  Surgical PCR screen     Status: None   Collection Time: 04/17/15  4:36 AM  Result Value Ref Range Status   MRSA, PCR NEGATIVE NEGATIVE Final   Staphylococcus aureus NEGATIVE NEGATIVE Final    Comment:        The Xpert SA Assay (FDA approved for NASAL specimens in patients over 38 years of age), is one component of a comprehensive surveillance program.  Test performance has been validated by [facility] for patients greater than or equal to 81 year old. It is not intended to diagnose infection nor to guide or monitor treatment.      Studies: Pelvis Portable  04/17/2015  CLINICAL DATA:  82 year old female status post left hip replacement. EXAM: PORTABLE PELVIS 1-2 VIEWS COMPARISON:  Left hip radiograph dated 04/16/2015 and fluoroscopic images dated 04/17/2015 FINDINGS: There has been interval placement of a total left arthroplasty. There is no acute fracture or dislocation. The bones are osteopenic. There are osteoarthritic changes of the hip joints. Soft tissue gas in the left hip area consistent with postsurgical changes. IMPRESSION: Status post total left hip arthroplasty. No acute fracture or dislocation. Electronically Signed   By: Anner Crete M.D.   On: 04/17/2015 18:38   Dg Chest Port 1 View  04/18/2015  CLINICAL DATA:  Followup chronic systolic congestive heart failure EXAM: PORTABLE CHEST 1 VIEW COMPARISON:  11/05/2012 FINDINGS: Stable upper normal heart size. Two lead pacer with generator over the left thorax. Vascular pattern normal. No evidence of pulmonary edema or pleural effusion. Mild retrocardiac left base opacity likely atelectasis. Mild chronic bronchitic change and upper lobe fibrosis.  Asymmetric opacity right apex likely exaggerated by positioning and likely representing pleural parenchymal fibrosis although apical mass not excluded. IMPRESSION: Retrocardiac opacity likely atelectasis. Asymmetric right apical opacity likely pleural parenchymal fibrosis although mass not excluded. No evidence of pulmonary edema. Electronically Signed   By: Skipper Cliche M.D.   On: 04/18/2015 16:06   Dg Hip Operative Unilat With Pelvis Left  04/17/2015  CLINICAL DATA:  Left hip arthroplasty EXAM: OPERATIVE LEFT HIP (WITH PELVIS IF PERFORMED) 3 VIEWS TECHNIQUE: Fluoroscopic spot image(s) were submitted for interpretation post-operatively. COMPARISON:  04/16/2015 FINDINGS: Spot films demonstrate interval left hip hemi arthroplasty would which appears located on this single view. No complicating features are identified. IMPRESSION: Left hip hemiarthroplasty without complicating features. Electronically Signed   By: Margarette Canada M.D.   On: 04/17/2015 17:27    Scheduled Meds: .  amiodarone  200 mg Oral Daily  . atenolol  50 mg Oral Daily  . diltiazem  120 mg Oral Daily  . enoxaparin (LOVENOX) injection  40 mg Subcutaneous Q24H  . levothyroxine  75 mcg Oral QAC breakfast   Continuous Infusions: . sodium chloride Stopped (04/19/15 0932)  . sodium chloride 100 mL/hr at 04/19/15 1553  . lactated ringers Stopped (04/17/15 1745)    Principal Problem:   Hip fracture (HCC) Active Problems:   PAF (paroxysmal atrial fibrillation) (HCC)   Chronic systolic heart failure ([place])   Hypertension   Ginette Pitman, [place] [facility] Pager [phone]. If 7PM-7AM, please contact night-coverage at [email or web address], password [facility] 04/19/2015, 4:56 PM  LOS: 3 days

## 2015-04-19 NOTE — Progress Notes (Addendum)
Physical Therapy Treatment Patient Details Name: Jamie Daugherty MRN: [id number] DOB: [birth year] Today's Date: 04/19/2015    History of Present Illness 82 y.o. female s/p prosthetic replacement for L femoral neck fx after mechanical fall at home. PMH significant for HTN, pacemaker, syncope, atrial fibrillation, and mild dementia.    PT Comments    Pt with increased activity tolerance and mobility today. Performed HEP in supine as well as sitting between transitional movements to limit orthostasis and no symptoms today. With return to chair end of session pulse oximeter applied with sats noted to be 83% on RA, applied 2L O2 with slow rise to 93% and HR 80. Pt denied SOB but notes fatigue with movement. Pt very motivated to progress.   Follow Up Recommendations  Supervision/Assistance - 24 hour;SNF     Equipment Recommendations       Recommendations for Other Services       Precautions / Restrictions Precautions Precautions: Fall Restrictions LLE Weight Bearing: Weight bearing as tolerated    Mobility  Bed Mobility Overal bed mobility: Needs Assistance Bed Mobility: Supine to Sit     Supine to sit: Min assist;HOB elevated     General bed mobility comments: HOB 20 degrees with cues for utilization of rail. assist to pivot lower body to EOB and elevate trunk fully. Increased time to scoot to EOB  Transfers Overall transfer level: Needs assistance   Transfers: Sit to/from Stand Sit to Stand: Min assist         General transfer comment: cue for hand placement and sequence with grossly 30 sec in static standing before attempting gait  Ambulation/Gait Ambulation/Gait assistance: Min assist;+2 safety/equipment Ambulation Distance (Feet): 15 Feet Assistive device: Rolling Rufer (2 wheeled) Gait Pattern/deviations: Step-through pattern;Decreased stride length;Trunk flexed;Narrow base of support   Gait velocity interpretation: Below normal speed for age/gender General  Gait Details: assist to advance and direct RW, max cues for posture, looking up and position in RW with chair following for safety. No presyncope this session but limited by fatigue   Stairs            Wheelchair Mobility    Modified Rankin (Stroke Patients Only)       Balance                                    Cognition Arousal/Alertness: Awake/alert Behavior During Therapy: WFL for tasks assessed/performed Overall Cognitive Status: History of cognitive impairments - at baseline       Memory: Decreased short-term memory              Exercises Total Joint Exercises Heel Slides: AROM;AAROM;Right;Left;10 reps;Supine (AAROM on LLE) Hip ABduction/ADduction: AROM;AAROM;Right;Left;10 reps;Supine (AAROM on LLE) Long Arc Quad: AROM;Both;10 reps;Seated    General Comments        Pertinent Vitals/Pain Pain Score: 5  Pain Location: left hip with movement, no pain at rest, pt denied intervention Pain Descriptors / Indicators: Aching Pain Intervention(s): Monitored during session;Repositioned    Home Living                      Prior Function            PT Goals (current goals can now be found in the care plan section) Progress towards PT goals: Progressing toward goals    Frequency  Min 5X/week    PT Plan Current plan remains appropriate;Frequency needs to be updated  Co-evaluation             End of Session Equipment Utilized During Treatment: Gait belt Activity Tolerance: Patient tolerated treatment well Patient left: in chair;with call bell/phone within reach;with chair alarm set     Time: [phone] PT Time Calculation (min) (ACUTE ONLY): 23 min  Charges:  $Gait Training: 8-22 mins $Therapeutic Exercise: 8-22 mins                    G Codes:      Melford Aase 20-Apr-2015, 11:13 AM Elwyn Reach, [place]

## 2015-04-20 LAB — BASIC METABOLIC PANEL
Anion gap: 5 (ref 5–15)
BUN: 12 mg/dL (ref 6–20)
CALCIUM: 7.5 mg/dL — AB (ref 8.9–10.3)
CHLORIDE: 105 mmol/L (ref 101–111)
CO2: 22 mmol/L (ref 22–32)
CREATININE: 0.6 mg/dL (ref 0.44–1.00)
GFR calc non Af Amer: >60 mL/min (ref >60)
Glucose, Bld: 112 mg/dL — ABNORMAL HIGH (ref 65–99)
Potassium: 3.6 mmol/L (ref 3.5–5.1)
SODIUM: 132 mmol/L — AB (ref 135–145)

## 2015-04-20 MED ORDER — POLYETHYLENE GLYCOL 3350 17 G PO PACK
17.0000 g | PACK | Freq: Every day | ORAL | Status: DC
  Administered 2015-04-20 – 2015-04-23 (×4): 17 g via ORAL
  Filled 2015-04-20 (×4): qty 1

## 2015-04-20 MED ORDER — BISACODYL 10 MG RE SUPP
10.0000 mg | Freq: Once | RECTAL | Status: DC
  Filled 2015-04-20: qty 1

## 2015-04-20 NOTE — Progress Notes (Signed)
[facility] PROGRESS NOTE  Jamie Daugherty [id number] DOB: [birth year] DOA: 04/16/2015 PCP: Stephens Shire, MD  HPI/Brief narrative 82 y.o. female with a history of Paroxsymal Atrial Fibrillation, Systolic CHF, HTN who presents to the ED with complaints of tripping and falling on the carpet in her home last night. In the AM, she had increased left hip pain and was not able to bear weight on her left leg. She was evaluated in the ED and was found to have a Left Femoral Neck Fracture on X-ray, and Orthopedics Dr Tomie China was consulted. She was referred for admission with a plan for probable surgery in the AM. She is not on any blood thinners  Assessment/Plan: 1. Left hip fracture 1. Orthopedic surgery consulted 2. Patient underwent prosthetic replacement for femoral neck fracture on 3/2 2. PAF 1. CHADS-VASc of at least 5 2. Pt was previously taken off anticoagulation secondary to severe epistaxis 3. Currently rate controlled  4. S/p pacemaker 3. Hypokalemia 1. Replaced 2. Continue to follow lytes 4. Chronic systolic CHF 1. Clinically euvolemic 2. Last EF of 30-35% in 11/16 3. Followed by Dr. Sallyanne Kuster as outpatient 4. Cautiously rehydrating with IVF per below 5. HTN 1. BP stable 6. DVT prophylaxis 1. SCD's 7. Orthostatic Hypotension 1. Pt with brief syncopal episode on standing with PT on 3/3 2. Pt's vitals confirms orthostasis 3. AM cortisol wnl 4. Much improved with IVF 5. This AM, no longer orthostatic by vitals, however pt did report mild unsteadiness on standing. Cont gentle hydration 8. Constipation 1. No significant bowel movement since admission 2. Abd mildly distended, pos BS 3. Will give trial of miralax plus dulcolax suppository   Code Status: Full Family Communication: Pt in room, son at bedside Disposition Plan: Plan SNF post-op, likely on 3/6   Consultants:  Orthopedic Surgery  Procedures:  3/2 - Anterior approach L hemi hip  arthoplasty  Antibiotics: Anti-infectives    Start     Dose/Rate Route Frequency Ordered Stop   04/17/15 2200  ceFAZolin (ANCEF) IVPB 2 g/50 mL premix     2 g 100 mL/hr over 30 Minutes Intravenous Every 6 hours 04/17/15 1852 04/18/15 1125   04/17/15 1630  [MAR Hold]  ceFAZolin (ANCEF) IVPB 2 g/50 mL premix     (MAR Hold since 04/17/15 1439)   2 g 100 mL/hr over 30 Minutes Intravenous To ShortStay Surgical 04/17/15 0239 04/17/15 1630   04/16/15 2000  ceFAZolin (ANCEF) IVPB 2 g/50 mL premix    Comments:  Anesthesia to give preop   2 g 100 mL/hr over 30 Minutes Intravenous  Once 04/16/15 1946 04/16/15 2104      HPI/Subjective: Complains of feeling constipated  Objective: Filed Vitals:   04/19/15 1300 04/19/15 2023 04/20/15 0712 04/20/15 1407  BP: 104/56 128/52 121/55 129/54  Pulse: 71 71 70 70  Temp: 97.9 F (36.6 C) 98.5 F (36.9 C) 98.6 F (37 C) 98.5 F (36.9 C)  TempSrc:  Oral Oral Oral  Resp: 18 16 16 16   SpO2: 95% 99% 99% 96%    Intake/Output Summary (Last 24 hours) at 04/20/15 1506 Last data filed at 04/19/15 1900  Gross per 24 hour  Intake 647.92 ml  Output      0 ml  Net 647.92 ml   There were no vitals filed for this visit.  Exam:   General:  Awake, in nad, sitting in chair, appears comfortable  Cardiovascular: regular, s1, s2  Respiratory: normal resp effort, no wheezing  Abdomen: soft, nondistended, pos BS  Musculoskeletal: perfused, no cyanosis  Data Reviewed: Basic Metabolic Panel:  Recent Labs Lab 04/16/15 1930 04/17/15 0325 04/18/15 0627 04/19/15 0348 04/20/15 0410  NA 136 136 133* 130* 132*  K 3.7 3.1* 3.4* 3.6 3.6  CL 98* 100* 100* 100* 105  CO2 25 25 23 23 22   GLUCOSE 113* 99 102* 113* 112*  BUN 14 10 17 15 12   CREATININE 0.81 0.70 0.90 0.77 0.60  CALCIUM 9.3 8.4* 7.8* 7.8* 7.5*   Liver Function Tests: No results for input(s): AST, ALT, ALKPHOS, BILITOT, PROT, ALBUMIN in the last 168 hours. No results for input(s): LIPASE,  AMYLASE in the last 168 hours. No results for input(s): AMMONIA in the last 168 hours. CBC:  Recent Labs Lab 04/16/15 1930 04/17/15 0325 04/18/15 0627  WBC 10.0 7.4 8.5  NEUTROABS 8.2*  --   --   HGB 12.7 12.2 10.5*  HCT 39.1 36.5 31.2*  MCV 88.9 87.7 89.1  PLT 223 198 193   Cardiac Enzymes: No results for input(s): CKTOTAL, CKMB, CKMBINDEX, TROPONINI in the last 168 hours. BNP (last 3 results) No results for input(s): BNP in the last 8760 hours.  ProBNP (last 3 results) No results for input(s): PROBNP in the last 8760 hours.  CBG: No results for input(s): GLUCAP in the last 168 hours.  Recent Results (from the past 240 hour(s))  Surgical PCR screen     Status: None   Collection Time: 04/17/15  4:36 AM  Result Value Ref Range Status   MRSA, PCR NEGATIVE NEGATIVE Final   Staphylococcus aureus NEGATIVE NEGATIVE Final    Comment:        The Xpert SA Assay (FDA approved for NASAL specimens in patients over 39 years of age), is one component of a comprehensive surveillance program.  Test performance has been validated by [facility] for patients greater than or equal to 89 year old. It is not intended to diagnose infection nor to guide or monitor treatment.      Studies: Dg Chest Port 1 View  04/18/2015  CLINICAL DATA:  Followup chronic systolic congestive heart failure EXAM: PORTABLE CHEST 1 VIEW COMPARISON:  11/05/2012 FINDINGS: Stable upper normal heart size. Two lead pacer with generator over the left thorax. Vascular pattern normal. No evidence of pulmonary edema or pleural effusion. Mild retrocardiac left base opacity likely atelectasis. Mild chronic bronchitic change and upper lobe fibrosis. Asymmetric opacity right apex likely exaggerated by positioning and likely representing pleural parenchymal fibrosis although apical mass not excluded. IMPRESSION: Retrocardiac opacity likely atelectasis. Asymmetric right apical opacity likely pleural parenchymal fibrosis  although mass not excluded. No evidence of pulmonary edema. Electronically Signed   By: Skipper Cliche M.D.   On: 04/18/2015 16:06    Scheduled Meds: . amiodarone  200 mg Oral Daily  . atenolol  50 mg Oral Daily  . bisacodyl  10 mg Rectal Once  . diltiazem  120 mg Oral Daily  . enoxaparin (LOVENOX) injection  40 mg Subcutaneous Q24H  . levothyroxine  75 mcg Oral QAC breakfast  . polyethylene glycol  17 g Oral Daily   Continuous Infusions: . sodium chloride Stopped (04/19/15 0932)  . sodium chloride 75 mL/hr at 04/20/15 1339  . lactated ringers Stopped (04/17/15 1745)    Principal Problem:   Hip fracture (HCC) Active Problems:   PAF (paroxysmal atrial fibrillation) (HCC)   Chronic systolic heart failure ([place])   Hypertension   Ginette Pitman, [place] [facility] Pager [phone]. If 7PM-7AM,  please contact night-coverage at [email or web address], password [facility] 04/20/2015, 3:06 PM  LOS: 4 days

## 2015-04-20 NOTE — Progress Notes (Signed)
Patient ID: Jamie Daugherty, female   DOB: [birth year], 82 y.o.   MRN: [id number] Dressing dry. Leg length equal.  SNF placement.

## 2015-04-21 DIAGNOSIS — F0390 Unspecified dementia without behavioral disturbance: Secondary | ICD-10-CM | POA: Insufficient documentation

## 2015-04-21 DIAGNOSIS — S72001A Fracture of unspecified part of neck of right femur, initial encounter for closed fracture: Secondary | ICD-10-CM

## 2015-04-21 DIAGNOSIS — E871 Hypo-osmolality and hyponatremia: Secondary | ICD-10-CM

## 2015-04-21 DIAGNOSIS — I951 Orthostatic hypotension: Secondary | ICD-10-CM

## 2015-04-21 DIAGNOSIS — I480 Paroxysmal atrial fibrillation: Secondary | ICD-10-CM

## 2015-04-21 DIAGNOSIS — S72002D Fracture of unspecified part of neck of left femur, subsequent encounter for closed fracture with routine healing: Secondary | ICD-10-CM

## 2015-04-21 DIAGNOSIS — Z96642 Presence of left artificial hip joint: Secondary | ICD-10-CM | POA: Insufficient documentation

## 2015-04-21 DIAGNOSIS — Z9660 Presence of unspecified orthopedic joint implant: Secondary | ICD-10-CM

## 2015-04-21 DIAGNOSIS — Z96649 Presence of unspecified artificial hip joint: Secondary | ICD-10-CM | POA: Insufficient documentation

## 2015-04-21 DIAGNOSIS — R0902 Hypoxemia: Secondary | ICD-10-CM

## 2015-04-21 DIAGNOSIS — I10 Essential (primary) hypertension: Secondary | ICD-10-CM

## 2015-04-21 DIAGNOSIS — I5022 Chronic systolic (congestive) heart failure: Secondary | ICD-10-CM

## 2015-04-21 DIAGNOSIS — W19XXXD Unspecified fall, subsequent encounter: Secondary | ICD-10-CM

## 2015-04-21 NOTE — Progress Notes (Signed)
[facility] PROGRESS NOTE  Jamie Daugherty [id number] DOB: Sep 17, 1918 DOA: 04/16/2015 PCP: Stephens Shire, MD  HPI/Brief narrative 82 y.o. female with a history of Paroxsymal Atrial Fibrillation, Systolic CHF, HTN who presents to the ED with complaints of tripping and falling on the carpet in her home last night. In the AM, she had increased left hip pain and was not able to bear weight on her left leg. She was evaluated in the ED and was found to have a Left Femoral Neck Fracture on X-ray, and Orthopedics Dr Tomie China was consulted. She was referred for admission with a plan for probable surgery in the AM. She is not on any blood thinners  Assessment/Plan: 1. Left hip fracture 1. Orthopedic surgery consulted 2. Patient underwent prosthetic replacement for femoral neck fracture on 3/2 2. PAF 1. CHADS-VASc of at least 5 2. Pt was previously taken off anticoagulation secondary to severe epistaxis 3. Currently rate controlled  4. S/p pacemaker 5. Stopped cardizem secondary to soft bp. Continue atenolol for now 3. Hypokalemia 1. Replaced 2. Continue to follow lytes 4. Chronic systolic CHF 1. Clinically euvolemic 2. Last EF of 30-35% in 11/16 3. Followed by Dr. Sallyanne Kuster as outpatient 4. Cautiously rehydrating with IVF per below 5. HTN 1. BP soft 2. Per above, hold cardizem and cont atenolol alone for now 6. DVT prophylaxis 1. SCD's 7. Orthostatic Hypotension 1. Pt with brief syncopal episode on standing with PT on 3/3 2. Pt's vitals confirmed orthostasis 3. AM cortisol wnl 4. Much improved with IVF 5. No longer orthostatic by vitals. Cont gentle hydration 6. Concerns for long-term decreased intake, possibly secondary to dementia 8. Constipation 1. No significant bowel movement since admission 2. Abd mildly distended, pos BS 3. Pos BM with cathartics cont to monitor 9. Dementia 1. Seems stable  Code Status: Full Family Communication: Pt in room, son at  bedside Disposition Plan: Possible CIR, pending   Consultants:  Orthopedic Surgery  CIR  Procedures:  3/2 - Anterior approach L hemi hip arthoplasty  Antibiotics: Anti-infectives    Start     Dose/Rate Route Frequency Ordered Stop   04/17/15 2200  ceFAZolin (ANCEF) IVPB 2 g/50 mL premix     2 g 100 mL/hr over 30 Minutes Intravenous Every 6 hours 04/17/15 1852 04/18/15 1125   04/17/15 1630  [MAR Hold]  ceFAZolin (ANCEF) IVPB 2 g/50 mL premix     (MAR Hold since 04/17/15 1439)   2 g 100 mL/hr over 30 Minutes Intravenous To ShortStay Surgical 04/17/15 0239 04/17/15 1630   04/16/15 2000  ceFAZolin (ANCEF) IVPB 2 g/50 mL premix    Comments:  Anesthesia to give preop   2 g 100 mL/hr over 30 Minutes Intravenous  Once 04/16/15 1946 04/16/15 2104      HPI/Subjective: No complaints today. Feels well  Objective: Filed Vitals:   04/21/15 0745 04/21/15 0929 04/21/15 1034 04/21/15 1300  BP: 121/47 96/48 127/58 104/52  Pulse: 70 70 78 96  Temp: 97.8 F (36.6 C)   97.5 F (36.4 C)  TempSrc: Oral   Oral  Resp: 16   16  SpO2: 94% 95%      Intake/Output Summary (Last 24 hours) at 04/21/15 1632 Last data filed at 04/21/15 1300  Gross per 24 hour  Intake    600 ml  Output    250 ml  Net    350 ml   There were no vitals filed for this visit.  Exam:   General:  Awake, in nad,  sitting in chair  Cardiovascular: regular, s1, s2  Respiratory: normal resp effort, no wheezing  Abdomen: soft, nondistended, pos BS  Musculoskeletal: perfused, no cyanosis, no clubbing  Data Reviewed: Basic Metabolic Panel:  Recent Labs Lab 04/16/15 1930 04/17/15 0325 04/18/15 0627 04/19/15 0348 04/20/15 0410  NA 136 136 133* 130* 132*  K 3.7 3.1* 3.4* 3.6 3.6  CL 98* 100* 100* 100* 105  CO2 25 25 23 23 22   GLUCOSE 113* 99 102* 113* 112*  BUN 14 10 17 15 12   CREATININE 0.81 0.70 0.90 0.77 0.60  CALCIUM 9.3 8.4* 7.8* 7.8* 7.5*   Liver Function Tests: No results for input(s): AST,  ALT, ALKPHOS, BILITOT, PROT, ALBUMIN in the last 168 hours. No results for input(s): LIPASE, AMYLASE in the last 168 hours. No results for input(s): AMMONIA in the last 168 hours. CBC:  Recent Labs Lab 04/16/15 1930 04/17/15 0325 04/18/15 0627  WBC 10.0 7.4 8.5  NEUTROABS 8.2*  --   --   HGB 12.7 12.2 10.5*  HCT 39.1 36.5 31.2*  MCV 88.9 87.7 89.1  PLT 223 198 193   Cardiac Enzymes: No results for input(s): CKTOTAL, CKMB, CKMBINDEX, TROPONINI in the last 168 hours. BNP (last 3 results) No results for input(s): BNP in the last 8760 hours.  ProBNP (last 3 results) No results for input(s): PROBNP in the last 8760 hours.  CBG: No results for input(s): GLUCAP in the last 168 hours.  Recent Results (from the past 240 hour(s))  Surgical PCR screen     Status: None   Collection Time: 04/17/15  4:36 AM  Result Value Ref Range Status   MRSA, PCR NEGATIVE NEGATIVE Final   Staphylococcus aureus NEGATIVE NEGATIVE Final    Comment:        The Xpert SA Assay (FDA approved for NASAL specimens in patients over 19 years of age), is one component of a comprehensive surveillance program.  Test performance has been validated by [facility] for patients greater than or equal to 15 year old. It is not intended to diagnose infection nor to guide or monitor treatment.      Studies: No results found.  Scheduled Meds: . amiodarone  200 mg Oral Daily  . atenolol  50 mg Oral Daily  . bisacodyl  10 mg Rectal Once  . enoxaparin (LOVENOX) injection  40 mg Subcutaneous Q24H  . levothyroxine  75 mcg Oral QAC breakfast  . polyethylene glycol  17 g Oral Daily   Continuous Infusions: . sodium chloride 50 mL/hr at 04/21/15 1153  . lactated ringers Stopped (04/17/15 1745)    Principal Problem:   Hip fracture (HCC) Active Problems:   PAF (paroxysmal atrial fibrillation) (HCC)   Chronic systolic heart failure ([place])   Hypertension   Fall   Hip joint replacement status   Dementia    Hypoxia   Orthostasis   Hyponatremia   Status post left hip replacement   CHIU, [place] [facility] Pager [phone]. If 7PM-7AM, please contact night-coverage at [email or web address], password [facility] 04/21/2015, 4:32 PM  LOS: 5 days

## 2015-04-21 NOTE — Progress Notes (Signed)
Inpatient Rehabilitation  We received a prescreen request from PT for possible IP Rehab.  At this time, we are recommending a rehab consult.  Please order if you are agreeable. Please call if questions.  [facility] Admissions Coordinator Cell [phone] Office [phone]

## 2015-04-21 NOTE — Progress Notes (Signed)
Physical Therapy Treatment Patient Details Name: Jamie Daugherty MRN: [id number] DOB: 04-01-1918 Today's Date: 04/21/2015    History of Present Illness 82 y.o. female s/p prosthetic replacement for L femoral neck fx after mechanical fall at home. PMH significant for HTN, pacemaker, syncope, atrial fibrillation, and mild dementia.    PT Comments    Pt pleasant but more confused today than last session. Pt unable to recall that she had THA. Pt also limited by hypotension again today with BP initially with standing 80/41 and end of session 96/48 limiting ability to perform gait or increased activity today. Pt also continues to require 2L O2 to maintain sats as she drops to 85% on RA at rest. Will continue to follow to maximize strength and function.   Follow Up Recommendations  Supervision/Assistance - 24 hour;SNF     Equipment Recommendations       Recommendations for Other Services       Precautions / Restrictions Precautions Precautions: Fall Precaution Comments: check bp Restrictions Weight Bearing Restrictions: Yes LLE Weight Bearing: Weight bearing as tolerated    Mobility  Bed Mobility Overal bed mobility: Needs Assistance Bed Mobility: Supine to Sit     Supine to sit: Min assist;HOB elevated     General bed mobility comments: HOB 20 degrees with cues for utilization of rail. assist to pivot lower body to EOB and elevate trunk fully. Increased time to scoot to EOB  Transfers Overall transfer level: Needs assistance   Transfers: Sit to/from [employer] Transfers Sit to Stand: Min assist Stand pivot transfers: Mod assist;+2 safety/equipment       General transfer comment: pt stood from bed and reported dizziness with return to sitting BP 80/41 MAP 51, EOB performed HEP and pt incontinent with stand and transfer to [facility] then to chair. BP in chair 96/48 MAP 59  Ambulation/Gait Ambulation/Gait assistance:  (unable due to hypotension)                Stairs            Wheelchair Mobility    Modified Rankin (Stroke Patients Only)       Balance Overall balance assessment: Needs assistance   Sitting balance-Leahy Scale: Fair       Standing balance-Leahy Scale: Poor                      Cognition Arousal/Alertness: Awake/alert Behavior During Therapy: WFL for tasks assessed/performed Overall Cognitive Status: Impaired/Different from baseline Area of Impairment: Orientation;Memory;Safety/judgement Orientation Level: Disoriented to;Time;Situation   Memory: Decreased short-term memory   Safety/Judgement: Decreased awareness of deficits     General Comments: pt unable to recall surgery today or why her leg was sore    Exercises General Exercises - Lower Extremity Long Arc Quad: AROM;Seated;Both;10 reps Heel Slides: AROM;AAROM;Right;Left;10 reps;Supine (AAROM on LLE) Hip ABduction/ADduction: AROM;AAROM;Right;Left;10 reps;Supine (AAROM on LLE)    General Comments        Pertinent Vitals/Pain Pain Assessment: 0-10 Pain Score: 3  Pain Location: left hip Pain Descriptors / Indicators: Aching Pain Intervention(s): Limited activity within patient's tolerance;Monitored during session;Repositioned;Other (comment) (pt denied need for meds)    Home Living                      Prior Function            PT Goals (current goals can now be found in the care plan section) Progress towards PT goals: Progressing toward goals (limited by hypotension  today)    Frequency  Min 3X/week    PT Plan Current plan remains appropriate;Frequency needs to be updated    Co-evaluation             End of Session Equipment Utilized During Treatment: Gait belt Activity Tolerance: Treatment limited secondary to medical complications (Comment) Patient left: in chair;with call bell/phone within reach     Time: 0842-0906 PT Time Calculation (min) (ACUTE ONLY): 24 min  Charges:  $Therapeutic Exercise:  8-22 mins $Therapeutic Activity: 8-22 mins                    G Codes:      Melford Aase 22-Apr-2015, 9:36 AM Elwyn Reach, [place]

## 2015-04-21 NOTE — Care Management Important Message (Signed)
Important Message  Patient Details  Name: Jamie Daugherty MRN: [id number] Date of Birth: 1918/07/07   Medicare Important Message Given:  Yes    [name] P Kyeisha Janowicz 04/21/2015, 3:08 PM

## 2015-04-21 NOTE — Consult Note (Signed)
Physical Medicine and Rehabilitation Consult  Reason for Consult:  Left hip fracture and advanced age.  Referring Physician: Dr. Wyline Copas   HPI: Jamie Daugherty is a 82 y.o. female with history of HTN, PAF, dementia who sustained mechanical fall onto left hip with increased left hip pain and inability to bear weight on LLE.  History taken from chart review and family. She was found to have left femoral neck fracture and she underwent left hip hemiarthroplasty on 03/02 by Dr. Erlinda Hong. Post op WBAT and on lovenox for DVT prophylaxis.   She continues to be limited by hypoxia, hypotension with orthostatic symptoms as well as fatigue. She is showing some improvement in activity tolerance and CIR recommended for follow up therapy. MD recommending CIR.   Has caregiver  7-1 pm to help get out of bed, assist with ADLs, make sure she takes meds and eats meals.  Family provides evening meals. Has two glasses of wine at nights daily. Family can hire 24 hours supervision after discharge.   Review of Systems  HENT: Positive for hearing loss.   Eyes: Negative for blurred vision.  Respiratory: Negative for shortness of breath.   Cardiovascular: Negative for chest pain.       History of orthostatic symptoms/ presyncope for years.   Gastrointestinal: Negative for abdominal pain.  Musculoskeletal: Positive for falls. Negative for myalgias and joint pain.  Neurological: Negative for speech change, focal weakness and headaches.  Psychiatric/Behavioral: Positive for memory loss. Negative for depression and hallucinations.  All other systems reviewed and are negative.   Past Medical History  Diagnosis Date  . Hypertension   . Atrial fibrillation ([place])   . Syncope   . Pacemaker - dual chamber [facility] implanted 2013 01/25/2013    Past Surgical History  Procedure Laterality Date  . Pacemaker insertion    . Abdominal hysterectomy    . Anterior approach hemi hip arthroplasty Left 04/17/2015   Procedure: ANTERIOR APPROACH LEFT HEMI HIP ARTHROPLASTY;  Surgeon: Leandrew Koyanagi, MD;  Location: [place];  Service: Orthopedics;  Laterality: Left;    Family History  Problem Relation Age of Onset  . Heart disease Mother   . Heart disease Father     Social History:  Lives alone and independent PTA--caregiver during the week and family checks at nights and on the weekends. She reports that she has quit smoking--about 50 years ago. . She does not have any smokeless tobacco history on file. Per reports that she drinks 2 glasses of wine daily.  She reports that she does not use illicit drugs.   Allergies  Allergen Reactions  . Penicillins Other (See Comments)    Unknown. Tolerates cephalexin.    Medications Prior to Admission  Medication Sig Dispense Refill  . amiodarone (PACERONE) 200 MG tablet Take 1 tablet (200 mg total) by mouth daily. 90 tablet 3  . atenolol (TENORMIN) 50 MG tablet Take 1 tablet (50 mg total) by mouth daily. 90 tablet 3  . diltiazem (CARDIZEM LA) 120 MG 24 hr tablet Take 1 tablet (120 mg total) by mouth daily. 30 tablet 11  . ibuprofen (ADVIL,MOTRIN) 200 MG tablet Take 400 mg by mouth every 6 (six) hours as needed for mild pain.    Marland Kitchen levothyroxine (SYNTHROID, LEVOTHROID) 75 MCG tablet Take 1 tablet (75 mcg total) by mouth daily before breakfast. 30 tablet 6    Home: Home Living Family/patient expects to be discharged to:: Private residence Living Arrangements: Alone Available Help  at Discharge: Personal care attendant (M-F 7-1) Type of Home: Apartment Home Access: Level entry Home Layout: One level Bathroom Shower/Tub: Tub/shower unit, Architectural technologist: Standard Bathroom Accessibility: Yes Home Equipment: Environmental consultant - standard, Bedside commode, Shower seat, Grab bars - tub/shower, Hand held shower head, Transport chair  Functional History: Prior Function Level of Independence: Needs assistance Gait / Transfers Assistance Needed: None ADL's / Homemaking  Assistance Needed: Supervision from PCA to get in/out of tub; assist with meals Comments: Drives Functional Status:  Mobility: Bed Mobility Overal bed mobility: Needs Assistance Bed Mobility: Supine to Sit Supine to sit: Min assist, HOB elevated General bed mobility comments: HOB 20 degrees with cues for utilization of rail. assist to pivot lower body to EOB and elevate trunk fully. Increased time to scoot to EOB Transfers Overall transfer level: Needs assistance Equipment used: Rolling [place] (2 wheeled) Transfers: Sit to/from Stand, [employer] Transfers Sit to Stand: Min assist Stand pivot transfers: Mod assist, +2 safety/equipment General transfer comment: pt stood from bed and reported dizziness with return to sitting BP 80/41 MAP 51, EOB performed HEP and pt incontinent with stand and transfer to [facility] then to chair. BP in chair 96/48 MAP 59 Ambulation/Gait Ambulation/Gait assistance:  (unable due to hypotension) Ambulation Distance (Feet): 15 Feet Assistive device: Rolling [place] (2 wheeled) Gait Pattern/deviations: Step-through pattern, Decreased stride length, Trunk flexed, Narrow base of support General Gait Details: assist to advance and direct RW, max cues for posture, looking up and position in RW with chair following for safety. No presyncope this session but limited by fatigue Gait velocity interpretation: Below normal speed for age/gender    ADL: ADL Overall ADL's : Needs assistance/impaired Upper Body Dressing : Set up, Sitting Lower Body Dressing: Maximal assistance, Sit to/from stand Lower Body Dressing Details (indicate cue type and reason): Unable to reach bilateral feet or cross ankle-over-knee for dressing tasks Toilet Transfer: Total assistance, +2 for physical assistance, Ambulation, RW Toilet Transfer Details (indicate cue type and reason): simulated to chair Toileting- Clothing Manipulation and Hygiene: Total assistance, +2 for physical assistance, Sit  to/from stand Toileting - Clothing Manipulation Details (indicate cue type and reason): Pt with orthostatic hypotension upon standing and attempting to ambulate. Required total assist to stand-pivot transfer to chair. Pt's daughter-in-law reports that pt has been dealing with fainting/passing out issue for several years. General ADL Comments: Pt's daughter-in-law present for OT eval.  Cognition: Cognition Overall Cognitive Status: Impaired/Different from baseline Orientation Level: Oriented to person, Oriented to place, Disoriented to time, Disoriented to situation Cognition Arousal/Alertness: Awake/alert Behavior During Therapy: WFL for tasks assessed/performed Overall Cognitive Status: Impaired/Different from baseline Area of Impairment: Orientation, Memory, Safety/judgement Orientation Level: Disoriented to, Time, Situation Memory: Decreased short-term memory Safety/Judgement: Decreased awareness of deficits General Comments: pt unable to recall surgery today or why her leg was sore   Blood pressure 96/48, pulse 70, temperature 97.8 F (36.6 C), temperature source Oral, resp. rate 16, SpO2 95 %. Physical Exam  Nursing note and vitals reviewed. Constitutional: She appears well-developed and well-nourished.  HENT:  Head: Normocephalic and atraumatic.  Eyes: Conjunctivae and EOM are normal. Pupils are equal, round, and reactive to light.  Neck: Normal range of motion. Neck supple.  Cardiovascular:  Murmur heard. Irregularly irregular  Respiratory: Effort normal and breath sounds normal. No respiratory distress. She has no wheezes.  GI: Soft. Bowel sounds are normal. She exhibits no distension. There is no tenderness.  Musculoskeletal: She exhibits no edema.  Left hip with ecchymosis.  Discomfort with Left hip ROM.  Arthritic changes in hands  Neurological: She is alert.  Alert and oriented 2 Sensation intact light touch Unaware of O2 tubing in place.  Has poor insight and  awareness but pleasant and able to follow basic commands without difficulty.   Motor: R Virgil reason for/5 Right lower extremity 4/5 hip flexion, knee extension, 4+/5 ankle dorsi/plantarflexion Left hip flexion 2 -/5, ankle dorsi/plantarflexion 4+/5    Skin: Skin is warm and dry.  Psychiatric: She has a normal mood and affect. Her speech is normal and behavior is normal. Cognition and memory are impaired. She expresses inappropriate judgment.    No results found for this or any previous visit (from the past 24 hour(s)). No results found.  Assessment/Plan: Diagnosis: left femoral neck fracture Labs and images independently reviewed.  Records reviewed and summated above. Oral pharmacological pain control  Bowel program: consider colace, miralax and/or Senna. PRN suppository Fall precautions Monitor surgical wound and skin especially over pressure sensitive areas Prevent immobility complications: pressure ulcers, contractures, HO Consider modalities, such as TENs, CPM  1. Does the need for close, 24 hr/day medical supervision in concert with the patient's rehab needs make it unreasonable for this patient to be served in a less intensive setting? Yes  2. Co-Morbidities requiring supervision/potential complications:  HTN (monitor and provide prns in accordance with increased physical exertion and pain), PAF (continue to monitor with increased physical activity, continue meds), dementia, hypoxia (continue to monitor her respiratory rate and O2 sats with increased exertion), orthostasis (consider conservative treatment before initiating medications), hyponatremia (continue to monitor, consider treating if necessary), CHF (Monitor in accordance with increased physical activity and avoid UE resistance excercises) 3. Due to safety, skin/wound care, disease management, medication administration, pain management and patient education, does the patient require 24 hr/day rehab nursing? Yes 4. Does the  patient require coordinated care of a physician, rehab nurse, PT (1-2 hrs/day, 5 days/week), OT (1-2 hrs/day, 5 days/week) and SLP (1-2 hrs/day, 5 days/week) to address physical and functional deficits in the context of the above medical diagnosis(es)? Yes Addressing deficits in the following areas: balance, endurance, locomotion, strength, transferring, bathing, dressing, grooming, toileting, cognition and psychosocial support 5. Can the patient actively participate in an intensive therapy program of at least 3 hrs of therapy per day at least 5 days per week? Not at present 6. The potential for patient to make measurable gains while on inpatient rehab is good 7. Anticipated functional outcomes upon discharge from inpatient rehab are modified independent and supervision  with PT, modified independent and supervision with OT, min assist with SLP. 8. Estimated rehab length of stay to reach the above functional goals is: 12-14 days. 9. Does the patient have adequate social supports and living environment to accommodate these discharge functional goals? Yes 10. Anticipated D/C setting: Home 11. Anticipated post D/C treatments: HH therapy and Home excercise program 12. Overall Rehab/Functional Prognosis: good  RECOMMENDATIONS: This patient's condition is appropriate for continued rehabilitative care in the following setting: CIR once activity tolerance improves Patient has agreed to participate in recommended program. Potentially Note that insurance prior authorization may be required for reimbursement for recommended care.  Comment: Rehab Admissions Coordinator to follow up.  Delice Lesch, MD 04/21/2015

## 2015-04-22 LAB — BASIC METABOLIC PANEL
ANION GAP: 11 (ref 5–15)
BUN: 9 mg/dL (ref 6–20)
CHLORIDE: 96 mmol/L — AB (ref 101–111)
CO2: 27 mmol/L (ref 22–32)
Calcium: 7.5 mg/dL — ABNORMAL LOW (ref 8.9–10.3)
Creatinine, Ser: 0.59 mg/dL (ref 0.44–1.00)
GFR calc Af Amer: >60 mL/min (ref >60)
GFR calc non Af Amer: >60 mL/min (ref >60)
Glucose, Bld: 98 mg/dL (ref 65–99)
POTASSIUM: 3 mmol/L — AB (ref 3.5–5.1)
SODIUM: 134 mmol/L — AB (ref 135–145)

## 2015-04-22 LAB — MAGNESIUM: Magnesium: 1.5 mg/dL — ABNORMAL LOW (ref 1.7–2.4)

## 2015-04-22 MED ORDER — DILTIAZEM HCL ER COATED BEADS 120 MG PO CP24
120.0000 mg | ORAL_CAPSULE | Freq: Every day | ORAL | Status: DC
  Administered 2015-04-22 – 2015-04-23 (×2): 120 mg via ORAL
  Filled 2015-04-22 (×2): qty 1

## 2015-04-22 MED ORDER — POTASSIUM CHLORIDE CRYS ER 20 MEQ PO TBCR
40.0000 meq | EXTENDED_RELEASE_TABLET | Freq: Two times a day (BID) | ORAL | Status: AC
  Administered 2015-04-22 (×2): 40 meq via ORAL
  Filled 2015-04-22 (×2): qty 2

## 2015-04-22 MED ORDER — MAGNESIUM SULFATE 2 GM/50ML IV SOLN
2.0000 g | Freq: Once | INTRAVENOUS | Status: AC
  Administered 2015-04-22: 2 g via INTRAVENOUS
  Filled 2015-04-22: qty 50

## 2015-04-22 NOTE — Progress Notes (Signed)
I met with pt and her son at bedside. I explained that I do not have a bed available for her today and unlikely tomorrow. We are their first preference but they are agreeable to [facility]. I have alerted RN Kensett and SW. [phone]

## 2015-04-22 NOTE — Progress Notes (Addendum)
Occupational Therapy Treatment Patient Details Name: Jamie Daugherty MRN: [id number] DOB: [birth year] Today's Date: 04/22/2015    History of present illness 82 y.o. female s/p prosthetic replacement for L femoral neck fx after mechanical fall at home. PMH significant for HTN, pacemaker, syncope, atrial fibrillation, and mild dementia.   OT comments  Patient making progress towards OT goals, continue plan of care for now. Took many BP readings during this OT session: Seated in recliner=125/57 Sit to stand, back to sit due to patient feeling "woozie"  = 109/48 After transfer recliner to BSC=112/42 After transfer BSC back to recliner=105/48  Patient's 02 sats decreased to as low as 74% while on 3L/min supplemental 02. Therapist encouraged pursed lip breathing. This was noticeably bad when pt was brushing her teeth. Had to turn supplemental 02 up to 7% to ensure sats remained greater than 90%.    Follow Up Recommendations  SNF;Supervision/Assistance - 24 hour    Equipment Recommendations  Other (comment) (TBD next venue of care)    Recommendations for Other Services  None at this time   Precautions / Restrictions Precautions Precautions: Fall Precaution Comments: check bp and 02 sats Restrictions Weight Bearing Restrictions: Yes LLE Weight Bearing: Weight bearing as tolerated     Mobility Bed Mobility General bed mobility comments: Pt found seated in recliner upon OT entering/exiting room   Transfers Overall transfer level: Needs assistance Equipment used: Rolling [place] (2 wheeled) Transfers: Sit to/from [employer] Sit to Stand: Min assist Stand pivot transfers: Min assist General transfer comment: Min assist to steady into standing, cues for hand placement, safety, and technique.     Balance Overall balance assessment: Needs assistance Sitting-balance support: No upper extremity supported;Feet supported Sitting balance-Leahy Scale: Fair     Standing  balance support: Bilateral upper extremity supported;During functional activity Standing balance-Leahy Scale: Fair   ADL Overall ADL's : Needs assistance/impaired Eating/Feeding: Set up;Sitting   Grooming: Set up;Sitting Grooming Details (indicate cue type and reason): Pt with difficulty problem solving how to get toothpaste out of container  Upper Body Bathing: Minimal assitance;Sitting   Lower Body Bathing: Moderate assistance;Sit to/from stand   Upper Body Dressing : Minimal assistance;Sitting   Lower Body Dressing: Maximal assistance;Sit to/from stand Lower Body Dressing Details (indicate cue type and reason): Unable to reach bilateral feet or cross ankle-over-knee for dressing tasks Toilet Transfer: Minimal assistance;Stand-pivot;BSC;RW;Cueing for safety   Toileting- Clothing Manipulation and Hygiene: Minimal assistance;Sit to/from Nurse, children's Details (indicate cue type and reason): did not occur   General ADL Comments: Pt found seated in recliner upon OT entering room. Pt willing to work with therapist. Pt performed sit to/from stands, stand pivot transfer recliner to/from [facility], LB ADLs, and grooming tasks seated in recliner with min assist. Pt required frequent re-direction and cueing.      Cognition   Behavior During Therapy: WFL for tasks assessed/performed Overall Cognitive Status: Impaired/Different from baseline Area of Impairment: Orientation;Memory;Safety/judgement;Problem solving Orientation Level: Disoriented to;Time;Situation   Memory: Decreased short-term memory    Safety/Judgement: Decreased awareness of deficits   Problem Solving: Slow processing;Difficulty sequencing;Requires verbal cues General Comments: pt unable to recall why her leg was sore, however pt did mention "I fell because I blacked out".                  Pertinent Vitals/ Pain       Pain Assessment: Faces Faces Pain Scale: Hurts even more Pain Location: left hip  Pain  Descriptors /  Indicators: Aching;Sore;Guarding Pain Intervention(s): Limited activity within patient's tolerance;Monitored during session   Frequency Min 2X/week     Progress Toward Goals  OT Goals(current goals can now befound in the care plan section)  Progress towards OT goals: Progressing toward goals  Acute Rehab OT Goals Patient Stated Goal: none stated OT Goal Formulation: With patient (pt able to state the plan was for her to go to rehab) Time For Goal Achievement: 05/02/15 Potential to Achieve Goals: Good  Plan Discharge plan remains appropriate    End of Session Equipment Utilized During Treatment: Gait belt;Rolling [place]   Activity Tolerance Patient tolerated treatment well   Patient Left in chair;with call bell/phone within reach;with chair alarm set;with nursing/sitter in room  Nurse Communication Mobility status     Time: [id number] OT Time Calculation (min): 32 min  Charges: OT General Charges $OT Visit: 1 Procedure OT Treatments $Self Care/Home Management : 23-37 mins  Chrys Racer , MS, OTR/L, CLT Pager: [phone]  04/22/2015, 2:01 PM

## 2015-04-22 NOTE — Care Management Note (Signed)
82 yr old female s/[Case Management Note  Patient Details  Name: Jamie Daugherty MRN: [id number] Date of Birth: 1918-03-09  Subjective/Objective:  82 yr old female s/p left fall, underwent a left hip hemiarthroplasty.                  Action/Plan:  Patient is from home, will need shortterm rehab at [facility]. Social worker is aware and contacting patient and family.   Expected Discharge Date:    04/22/15              Expected Discharge Plan:   Skilled Nursing facility  In-House Referral:     Discharge planning Services   Social worker  Post Acute Care Choice:    Choice offered to:     DME Arranged:   NA DME Agency:     HH Arranged:   NA HH Agency:     Status of Service:   Completed  Medicare Important Message Given:  Yes Date Medicare IM Given:    Medicare IM give by:    Date Additional Medicare IM Given:    Additional Medicare Important Message give by:     If discussed at [place] of Stay Meetings, dates discussed:    Additional Comments:  Ninfa Meeker, RN 04/22/2015, 11:02 AM

## 2015-04-22 NOTE — Progress Notes (Signed)
[facility] PROGRESS NOTE  Jamie Daugherty [id number] DOB: 27-Aug-1918 DOA: 04/16/2015 PCP: Stephens Shire, MD  HPI/Brief narrative 82 y.o. female with a history of Paroxsymal Atrial Fibrillation, Systolic CHF, HTN who presents to the ED with complaints of tripping and falling on the carpet in her home last night. In the AM, she had increased left hip pain and was not able to bear weight on her left leg. She was evaluated in the ED and was found to have a Left Femoral Neck Fracture on X-ray, and Orthopedics Dr Tomie China was consulted. She was referred for admission with a plan for probable surgery in the AM. She is not on any blood thinners  Assessment/Plan: 1. Left hip fracture 1. Orthopedic surgery consulted 2. Patient underwent prosthetic replacement for femoral neck fracture on 3/2 2. PAF 1. CHADS-VASc of at least 5 2. Pt was previously taken off anticoagulation secondary to severe epistaxis 3. Currently rate controlled  4. S/p pacemaker 5. Stopped cardizem secondary to soft bp. Continue atenolol for now 3. Hypokalemia 1. Replaced 2. Continue to follow lytes 4. Chronic systolic CHF 1. Clinically euvolemic 2. Last EF of 30-35% in 11/16 3. Followed by Dr. Sallyanne Kuster as outpatient 4. Cautiously rehydrating with IVF per below 5. HTN 1. BP initially soft, especially post-op 2. held cardizem and cont atenolol alone 3. BP has risen overnight. Will resume cardizem 6. DVT prophylaxis 1. SCD's 7. Orthostatic Hypotension 1. Pt with brief syncopal episode on standing with PT on 3/3 2. Pt's vitals confirmed orthostasis 3. AM cortisol wnl 4. Much improved with IVF 5. No longer orthostatic by vitals. Cont gentle hydration 6. Concerns for long-term decreased intake, possibly secondary to dementia 8. Constipation 1. No significant bowel movement since admission 2. Abd mildly distended, pos BS 3. Pos BM with cathartics cont to monitor 9. Dementia 1. Seems  stable 10. Hypokalemia 1. Will correct 2. Also will correct Mg 11. Hypomagnesemia 1. Will correct  Code Status: Full Family Communication: Pt in room, son at bedside Disposition Plan: Possible CIR, pending   Consultants:  Orthopedic Surgery  CIR  Procedures:  3/2 - Anterior approach L hemi hip arthoplasty  Antibiotics: Anti-infectives    Start     Dose/Rate Route Frequency Ordered Stop   04/17/15 2200  ceFAZolin (ANCEF) IVPB 2 g/50 mL premix     2 g 100 mL/hr over 30 Minutes Intravenous Every 6 hours 04/17/15 1852 04/18/15 1125   04/17/15 1630  [MAR Hold]  ceFAZolin (ANCEF) IVPB 2 g/50 mL premix     (MAR Hold since 04/17/15 1439)   2 g 100 mL/hr over 30 Minutes Intravenous To [facility] 04/17/15 0239 04/17/15 1630   04/16/15 2000  ceFAZolin (ANCEF) IVPB 2 g/50 mL premix    Comments:  Anesthesia to give preop   2 g 100 mL/hr over 30 Minutes Intravenous  Once 04/16/15 1946 04/16/15 2104      HPI/Subjective: Eager to start therapy  Objective: Filed Vitals:   04/21/15 1034 04/21/15 1300 04/21/15 2052 04/22/15 0536  BP: 127/58 104/52 148/67 155/77  Pulse: 78 96 70 70  Temp:  97.5 F (36.4 C) 98.1 F (36.7 C) 98.3 F (36.8 C)  TempSrc:  Oral Oral   Resp:  16 16 16   SpO2:   94% 93%    Intake/Output Summary (Last 24 hours) at 04/22/15 1152 Last data filed at 04/22/15 1019  Gross per 24 hour  Intake    920 ml  Output   1200 ml  Net   -  280 ml   There were no vitals filed for this visit.  Exam:   General:  Awake, in nad, sitting in chair  Cardiovascular: regular, s1, s2  Respiratory: normal resp effort, no wheezing  Abdomen: soft, nondistended, pos BS  Musculoskeletal: perfused, no clubbing  Data Reviewed: Basic Metabolic Panel:  Recent Labs Lab 04/17/15 0325 04/18/15 0627 04/19/15 0348 04/20/15 0410 04/22/15 0530  NA 136 133* 130* 132* 134*  K 3.1* 3.4* 3.6 3.6 3.0*  CL 100* 100* 100* 105 96*  CO2 25 23 23 22 27   GLUCOSE 99 102*  113* 112* 98  BUN 10 17 15 12 9   CREATININE 0.70 0.90 0.77 0.60 0.59  CALCIUM 8.4* 7.8* 7.8* 7.5* 7.5*  MG  --   --   --   --  1.5*   Liver Function Tests: No results for input(s): AST, ALT, ALKPHOS, BILITOT, PROT, ALBUMIN in the last 168 hours. No results for input(s): LIPASE, AMYLASE in the last 168 hours. No results for input(s): AMMONIA in the last 168 hours. CBC:  Recent Labs Lab 04/16/15 1930 04/17/15 0325 04/18/15 0627  WBC 10.0 7.4 8.5  NEUTROABS 8.2*  --   --   HGB 12.7 12.2 10.5*  HCT 39.1 36.5 31.2*  MCV 88.9 87.7 89.1  PLT 223 198 193   Cardiac Enzymes: No results for input(s): CKTOTAL, CKMB, CKMBINDEX, TROPONINI in the last 168 hours. BNP (last 3 results) No results for input(s): BNP in the last 8760 hours.  ProBNP (last 3 results) No results for input(s): PROBNP in the last 8760 hours.  CBG: No results for input(s): GLUCAP in the last 168 hours.  Recent Results (from the past 240 hour(s))  Surgical PCR screen     Status: None   Collection Time: 04/17/15  4:36 AM  Result Value Ref Range Status   MRSA, PCR NEGATIVE NEGATIVE Final   Staphylococcus aureus NEGATIVE NEGATIVE Final    Comment:        The Xpert SA Assay (FDA approved for NASAL specimens in patients over 44 years of age), is one component of a comprehensive surveillance program.  Test performance has been validated by [facility] for patients greater than or equal to 87 year old. It is not intended to diagnose infection nor to guide or monitor treatment.      Studies: No results found.  Scheduled Meds: . amiodarone  200 mg Oral Daily  . atenolol  50 mg Oral Daily  . bisacodyl  10 mg Rectal Once  . diltiazem  120 mg Oral Daily  . enoxaparin (LOVENOX) injection  40 mg Subcutaneous Q24H  . levothyroxine  75 mcg Oral QAC breakfast  . magnesium sulfate 1 - 4 g bolus IVPB  2 g Intravenous Once  . polyethylene glycol  17 g Oral Daily  . potassium chloride  40 mEq Oral BID WC    Continuous Infusions: . sodium chloride 50 mL/hr at 04/21/15 1153  . lactated ringers Stopped (04/17/15 1745)    Principal Problem:   Hip fracture (HCC) Active Problems:   PAF (paroxysmal atrial fibrillation) (HCC)   Chronic systolic heart failure ([place])   Hypertension   Fall   Hip joint replacement status   Dementia   Hypoxia   Orthostasis   Hyponatremia   Status post left hip replacement   Wellington Winegarden, [place] [facility] Pager [phone]. If 7PM-7AM, please contact night-coverage at [email or web address], password [facility] 04/22/2015, 11:52 AM  LOS: 6 days

## 2015-04-23 DIAGNOSIS — M6281 Muscle weakness (generalized): Secondary | ICD-10-CM | POA: Diagnosis not present

## 2015-04-23 DIAGNOSIS — I480 Paroxysmal atrial fibrillation: Secondary | ICD-10-CM | POA: Diagnosis not present

## 2015-04-23 DIAGNOSIS — R41841 Cognitive communication deficit: Secondary | ICD-10-CM | POA: Diagnosis not present

## 2015-04-23 DIAGNOSIS — Z9660 Presence of unspecified orthopedic joint implant: Secondary | ICD-10-CM | POA: Diagnosis not present

## 2015-04-23 DIAGNOSIS — G8929 Other chronic pain: Secondary | ICD-10-CM | POA: Diagnosis not present

## 2015-04-23 DIAGNOSIS — S72012D Unspecified intracapsular fracture of left femur, subsequent encounter for closed fracture with routine healing: Secondary | ICD-10-CM | POA: Diagnosis not present

## 2015-04-23 DIAGNOSIS — I951 Orthostatic hypotension: Secondary | ICD-10-CM | POA: Diagnosis not present

## 2015-04-23 DIAGNOSIS — S9001XA Contusion of right ankle, initial encounter: Secondary | ICD-10-CM | POA: Diagnosis not present

## 2015-04-23 DIAGNOSIS — K5900 Constipation, unspecified: Secondary | ICD-10-CM | POA: Diagnosis not present

## 2015-04-23 DIAGNOSIS — Z950 Presence of cardiac pacemaker: Secondary | ICD-10-CM | POA: Diagnosis not present

## 2015-04-23 DIAGNOSIS — I495 Sick sinus syndrome: Secondary | ICD-10-CM | POA: Diagnosis not present

## 2015-04-23 DIAGNOSIS — I481 Persistent atrial fibrillation: Secondary | ICD-10-CM | POA: Diagnosis not present

## 2015-04-23 DIAGNOSIS — E871 Hypo-osmolality and hyponatremia: Secondary | ICD-10-CM | POA: Diagnosis not present

## 2015-04-23 DIAGNOSIS — I5022 Chronic systolic (congestive) heart failure: Secondary | ICD-10-CM | POA: Diagnosis not present

## 2015-04-23 DIAGNOSIS — S72002A Fracture of unspecified part of neck of left femur, initial encounter for closed fracture: Secondary | ICD-10-CM | POA: Diagnosis not present

## 2015-04-23 DIAGNOSIS — G301 Alzheimer's disease with late onset: Secondary | ICD-10-CM | POA: Diagnosis not present

## 2015-04-23 DIAGNOSIS — I10 Essential (primary) hypertension: Secondary | ICD-10-CM | POA: Diagnosis not present

## 2015-04-23 DIAGNOSIS — I5020 Unspecified systolic (congestive) heart failure: Secondary | ICD-10-CM | POA: Diagnosis not present

## 2015-04-23 DIAGNOSIS — Z4732 Aftercare following explantation of hip joint prosthesis: Secondary | ICD-10-CM | POA: Diagnosis not present

## 2015-04-23 DIAGNOSIS — I4891 Unspecified atrial fibrillation: Secondary | ICD-10-CM | POA: Diagnosis not present

## 2015-04-23 DIAGNOSIS — E039 Hypothyroidism, unspecified: Secondary | ICD-10-CM | POA: Diagnosis not present

## 2015-04-23 DIAGNOSIS — S728X9A Other fracture of unspecified femur, initial encounter for closed fracture: Secondary | ICD-10-CM | POA: Diagnosis not present

## 2015-04-23 DIAGNOSIS — F0390 Unspecified dementia without behavioral disturbance: Secondary | ICD-10-CM | POA: Diagnosis not present

## 2015-04-23 DIAGNOSIS — I509 Heart failure, unspecified: Secondary | ICD-10-CM | POA: Diagnosis not present

## 2015-04-23 DIAGNOSIS — S72002D Fracture of unspecified part of neck of left femur, subsequent encounter for closed fracture with routine healing: Secondary | ICD-10-CM | POA: Diagnosis not present

## 2015-04-23 DIAGNOSIS — Z419 Encounter for procedure for purposes other than remedying health state, unspecified: Secondary | ICD-10-CM | POA: Diagnosis not present

## 2015-04-23 DIAGNOSIS — E43 Unspecified severe protein-calorie malnutrition: Secondary | ICD-10-CM | POA: Diagnosis not present

## 2015-04-23 DIAGNOSIS — R278 Other lack of coordination: Secondary | ICD-10-CM | POA: Diagnosis not present

## 2015-04-23 DIAGNOSIS — Z9181 History of falling: Secondary | ICD-10-CM | POA: Diagnosis not present

## 2015-04-23 DIAGNOSIS — R2689 Other abnormalities of gait and mobility: Secondary | ICD-10-CM | POA: Diagnosis not present

## 2015-04-23 LAB — BASIC METABOLIC PANEL
Anion gap: 8 (ref 5–15)
BUN: 10 mg/dL (ref 6–20)
CHLORIDE: 98 mmol/L — AB (ref 101–111)
CO2: 27 mmol/L (ref 22–32)
CREATININE: 0.54 mg/dL (ref 0.44–1.00)
Calcium: 7.4 mg/dL — ABNORMAL LOW (ref 8.9–10.3)
GFR calc Af Amer: >60 mL/min (ref >60)
Glucose, Bld: 107 mg/dL — ABNORMAL HIGH (ref 65–99)
Potassium: 3.5 mmol/L (ref 3.5–5.1)
SODIUM: 133 mmol/L — AB (ref 135–145)

## 2015-04-23 LAB — HEMOGLOBIN AND HEMATOCRIT, BLOOD
HEMATOCRIT: 29 % — AB (ref 36.0–46.0)
HEMOGLOBIN: 9.7 g/dL — AB (ref 12.0–15.0)

## 2015-04-23 LAB — MAGNESIUM: MAGNESIUM: 1.9 mg/dL (ref 1.7–2.4)

## 2015-04-23 MED ORDER — POLYETHYLENE GLYCOL 3350 17 G PO PACK: 17.0000 g | PACK | Freq: Every day | ORAL | Status: DC

## 2015-04-23 NOTE — Progress Notes (Signed)
Report called to [facility] 

## 2015-04-23 NOTE — Progress Notes (Signed)
Patient will discharge to [facility] Anticipated discharge date: 3/8 Family notified: [facility] [employer] by Corey Harold- called at 3:45pm  [place] signing off.  Domenica Reamer, [place] Social Worker [phone]

## 2015-04-23 NOTE — Progress Notes (Signed)
I continue to not have a bed available to offer this pt for admission today. I have alerted RN [place] and SW. [phone]

## 2015-04-23 NOTE — Progress Notes (Signed)
Physical Therapy Treatment Patient Details Name: Jamie Daugherty MRN: [id number] DOB: 01-Oct-1918 Today's Date: 04/23/2015    History of Present Illness 82 y.o. female s/p prosthetic replacement for L femoral neck fx after mechanical fall at home. PMH significant for HTN, pacemaker, syncope, atrial fibrillation, and mild dementia.    PT Comments    Patient continues to remain limited by low BP with upright activity, decreased O2 saturations (dropping to 81% on 2 liters) and is symptomatic with dizziness and "fainting-ness" per patient. At this time, mobility remains limited to OOB to chair. BPs assessed with patient dropping from 159/66 to 137/68 and saturations from 96% to 81%; HR remains in range of mid 60s-80s.   Follow Up Recommendations  SNF;Supervision/Assistance - 24 hour     Equipment Recommendations   (TBD at next venue)    Recommendations for Other Services       Precautions / Restrictions Precautions Precautions: Fall Precaution Comments: check bp and 02 sats Restrictions Weight Bearing Restrictions: Yes LLE Weight Bearing: Weight bearing as tolerated    Mobility  Bed Mobility Overal bed mobility: Needs Assistance Bed Mobility: Supine to Sit     Supine to sit: Min guard     General bed mobility comments: VCs for positioning, increased time to perform, no physical assist required  Transfers Overall transfer level: Needs assistance Equipment used: 2 person hand held assist Transfers: Sit to/from [facility] Sit to Stand: Min assist Stand pivot transfers: Min assist       General transfer comment: Min assist for stability during pivot to chair. patient with increased light headedness during transition. BP dropping from 158/66 to 137/68  Ambulation/Gait                 Stairs            Wheelchair Mobility    Modified Rankin (Stroke Patients Only)       Balance                                     Cognition Arousal/Alertness: Awake/alert Behavior During Therapy: WFL for tasks assessed/performed Overall Cognitive Status: Impaired/Different from baseline Area of Impairment: Orientation;Memory;Safety/judgement;Problem solving Orientation Level: Disoriented to;Time;Situation   Memory: Decreased short-term memory   Safety/Judgement: Decreased awareness of deficits   Problem Solving: Slow processing;Difficulty sequencing;Requires verbal cues General Comments: patient pleasently intermittent confusion. No recall of surgery until cued.  Patient also with decreased memory of being OOB. She was surprised to see a chair in the room thinking it was a new chair that we brought for her when in fact son reports she had been in the chair yesterday.    Exercises      General Comments General comments (skin integrity, edema, etc.): encouraged OOB to chair for >1 hour and use of IS.      Pertinent Vitals/Pain Pain Assessment: Faces Faces Pain Scale: Hurts little more Pain Location: left hip Pain Descriptors / Indicators: Grimacing Pain Intervention(s): Limited activity within patient's tolerance;Monitored during session    Home Living                      Prior Function            PT Goals (current goals can now be found in the care plan section) Acute Rehab PT Goals Patient Stated Goal: none stated PT Goal Formulation: With patient/family Time For Goal Achievement: 05/02/15  Potential to Achieve Goals: Good Progress towards PT goals: Progressing toward goals (remains limited by hypotension and dizziness)    Frequency  Min 3X/week    PT Plan Current plan remains appropriate;Frequency needs to be updated    Co-evaluation             End of Session Equipment Utilized During Treatment: Gait belt Activity Tolerance: Treatment limited secondary to medical complications (Comment) Patient left: in chair;with call bell/phone within reach;with family/visitor present  (limited by dizziness and desaturation 81% on 2 liters)     Time: [id number] PT Time Calculation (min) (ACUTE ONLY): 18 min  Charges:  $Therapeutic Activity: 8-22 mins                    G CodesDuncan Dull 05-15-2015, 2:08 PM  Alben Deeds, [place] DPT  [phone]

## 2015-04-23 NOTE — Discharge Summary (Signed)
Discharge Summary  Jamie Daugherty [id number] DOB: 1918-03-27  PCP: Stephens Shire, MD  Admit date: 04/16/2015 Anticipated Discharge date: 04/23/2015  Time spent: 25 minutes   Recommendations for Outpatient Follow-up:  1. New medication: Lovenox 40 mg subcutaneous daily 30 days for DVT prophylaxis while on this medication-prn Motrin discontinued  2.  New medication: Percocet 5/325, 1-2 tabs by mouth every 6 hours when necessary for pain 3.  follow-up appointment in 2 weeks for staple removal with orthopedic surgery. Follow-up in 6 weeks for formal follow-up 4. Wound care: Betadine to incision twice a day  Discharge Diagnoses:  Active Hospital Problems   Diagnosis Date Noted  . Hip fracture ([place]) 04/16/2015  . Hip joint replacement status   . Dementia   . Hypoxia   . Orthostasis   . Hyponatremia   . Status post left hip replacement   . Hypertension 04/16/2015  . Fall 04/16/2015  . Chronic systolic heart failure ([place]) 12/25/2014  . PAF (paroxysmal atrial fibrillation) ([place]) 11/06/2012    Resolved Hospital Problems   Diagnosis Date Noted Date Resolved  No resolved problems to display.    Discharge Condition: Improved, being discharged to short-term skilled nursing   Diet recommendation: Heart healthy   Filed Vitals:   04/22/15 2101 04/23/15 0615  BP: 157/64 152/59  Pulse: 70 70  Temp: 98.1 F (36.7 C) 98.3 F (36.8 C)  Resp: 17 17   History of present illness:  82 y.o. female with a history of Paroxsymal Atrial Fibrillation, Systolic CHF, HTN who presents to the ED on 3/1 with complaints of tripping and falling on the carpet in her home the night before. In the AM, she had increased left hip pain and was not able to bear weight on her left leg. She was evaluated in the ED and was found to have a Left Femoral Neck Fracture on X-ray, and Orthopedics Dr Tomie China was consulted.   Hospital Course:  Principal Problem:   Hip fracture [facility]): orthopedic surgery took patient  to operating room on 3/2 for prosthetic replacement of left femoral neck fracture.   Active Problems:   PAF (paroxysmal atrial fibrillation) ([place]): Chads 2 vasc score of 5. Previously taken off anticoagulation secondary to severe epistaxis. Rate controlled on amiodarone status post pacemaker. Will be on Lovenox for next 30 days    Chronic systolic heart failure ([place]): Clinically euvolemic. EF from echocardiogram in November 2016 at 30-35 percent. Cautious rehydration    Hypertension: Essential. Initially soft post surgery. Cardizem initially held and now restarted    Dementia With no behavioral disturbance: Stable  Procedures:  Prosthetic replacement for left femoral neck fracture   Consultations:  Orthopedic surgery   Discharge Exam: BP 152/59 mmHg  Pulse 70  Temp(Src) 98.3 F (36.8 C) (Oral)  Resp 17  SpO2 92%  General: Alert and oriented 2, no acute distress Cardiovascular: Regular rate and rhythm, S1-S2, frequent ectopic beats   Respiratory: Clear to auscultation bilaterally   Discharge Instructions You were cared for by a hospitalist during your hospital stay. If you have any questions about your discharge medications or the care you received while you were in the hospital after you are discharged, you can call the unit and asked to speak with the hospitalist on call if the hospitalist that took care of you is not available. Once you are discharged, your primary care physician will handle any further medical issues. Please note that NO REFILLS for any discharge medications will be authorized once you are discharged,  as it is imperative that you return to your primary care physician (or establish a relationship with a primary care physician if you do not have one) for your aftercare needs so that they can reassess your need for medications and monitor your lab values.  Discharge Instructions    Diet - low sodium heart healthy    Complete by:  As directed      Increase  activity slowly    Complete by:  As directed      Weight bearing as tolerated    Complete by:  As directed             Medication List    STOP taking these medications        ibuprofen 200 MG tablet  Commonly known as:  ADVIL,MOTRIN      TAKE these medications        amiodarone 200 MG tablet  Commonly known as:  PACERONE  Take 1 tablet (200 mg total) by mouth daily.     atenolol 50 MG tablet  Commonly known as:  TENORMIN  Take 1 tablet (50 mg total) by mouth daily.     diltiazem 120 MG 24 hr tablet  Commonly known as:  CARDIZEM LA  Take 1 tablet (120 mg total) by mouth daily.     enoxaparin 40 MG/0.4ML injection  Commonly known as:  LOVENOX  Inject 0.4 mLs (40 mg total) into the skin daily.     levothyroxine 75 MCG tablet  Commonly known as:  SYNTHROID, LEVOTHROID  Take 1 tablet (75 mcg total) by mouth daily before breakfast.     oxyCODONE-acetaminophen 5-325 MG tablet  Commonly known as:  PERCOCET  Take 1-2 tablets by mouth every 4 (four) hours as needed for severe pain.     polyethylene glycol packet  Commonly known as:  MIRALAX / GLYCOLAX  Take 17 g by mouth daily.       Allergies  Allergen Reactions  . Penicillins Other (See Comments)    Unknown. Tolerates cephalexin.       Follow-up Information    Follow up with Marianna Payment, MD In 2 weeks.   Specialty:  Orthopedic Surgery   Why:  For suture removal, For wound re-check   Contact information:   [place] [address] [phone]        The results of significant diagnostics from this hospitalization (including imaging, microbiology, ancillary and laboratory) are listed below for reference.    Significant Diagnostic Studies: Pelvis Portable  04/17/2015  CLINICAL DATA:  82 year old female status post left hip replacement. EXAM: PORTABLE PELVIS 1-2 VIEWS COMPARISON:  Left hip radiograph dated 04/16/2015 and fluoroscopic images dated 04/17/2015 FINDINGS: There has  been interval placement of a total left arthroplasty. There is no acute fracture or dislocation. The bones are osteopenic. There are osteoarthritic changes of the hip joints. Soft tissue gas in the left hip area consistent with postsurgical changes. IMPRESSION: Status post total left hip arthroplasty. No acute fracture or dislocation. Electronically Signed   By: Anner Crete M.D.   On: 04/17/2015 18:38   Dg Chest Port 1 View  04/18/2015  CLINICAL DATA:  Followup chronic systolic congestive heart failure EXAM: PORTABLE CHEST 1 VIEW COMPARISON:  11/05/2012 FINDINGS: Stable upper normal heart size. Two lead pacer with generator over the left thorax. Vascular pattern normal. No evidence of pulmonary edema or pleural effusion. Mild retrocardiac left base opacity likely atelectasis. Mild chronic bronchitic change and upper lobe fibrosis.  Asymmetric opacity right apex likely exaggerated by positioning and likely representing pleural parenchymal fibrosis although apical mass not excluded. IMPRESSION: Retrocardiac opacity likely atelectasis. Asymmetric right apical opacity likely pleural parenchymal fibrosis although mass not excluded. No evidence of pulmonary edema. Electronically Signed   By: Skipper Cliche M.D.   On: 04/18/2015 16:06   Dg Hip Operative Unilat With Pelvis Left  04/17/2015  CLINICAL DATA:  Left hip arthroplasty EXAM: OPERATIVE LEFT HIP (WITH PELVIS IF PERFORMED) 3 VIEWS TECHNIQUE: Fluoroscopic spot image(s) were submitted for interpretation post-operatively. COMPARISON:  04/16/2015 FINDINGS: Spot films demonstrate interval left hip hemi arthroplasty would which appears located on this single view. No complicating features are identified. IMPRESSION: Left hip hemiarthroplasty without complicating features. Electronically Signed   By: Margarette Canada M.D.   On: 04/17/2015 17:27   Dg Hip Unilat With Pelvis 2-3 Views Left  04/16/2015  CLINICAL DATA:  Family states that today they think pt fell and  injured her left hip, she states she fell while walking yesterday. Pt normally ambulates independently but cannot put any weight on her left hip today. EXAM: DG HIP (WITH OR WITHOUT PELVIS) 2-3V LEFT COMPARISON:  None. FINDINGS: There is a left subcapital femoral neck fracture, non comminuted and without significant displacement. There is mild valgus angulation. No other fractures. Hip joints, SI joints and symphysis pubis are normally aligned. Bones are diffusely demineralized. IMPRESSION: Subcapital left femoral neck fracture with valgus angulation. Electronically Signed   By: Lajean Manes M.D.   On: 04/16/2015 17:57    Microbiology: Recent Results (from the past 240 hour(s))  Surgical PCR screen     Status: None   Collection Time: 04/17/15  4:36 AM  Result Value Ref Range Status   MRSA, PCR NEGATIVE NEGATIVE Final   Staphylococcus aureus NEGATIVE NEGATIVE Final    Comment:        The Xpert SA Assay (FDA approved for NASAL specimens in patients over 71 years of age), is one component of a comprehensive surveillance program.  Test performance has been validated by [facility] for patients greater than or equal to 78 year old. It is not intended to diagnose infection nor to guide or monitor treatment.      Labs: Basic Metabolic Panel:  Recent Labs Lab 04/18/15 0627 04/19/15 0348 04/20/15 0410 04/22/15 0530 04/23/15 0445  NA 133* 130* 132* 134* 133*  K 3.4* 3.6 3.6 3.0* 3.5  CL 100* 100* 105 96* 98*  CO2 23 23 22 27 27   GLUCOSE 102* 113* 112* 98 107*  BUN 17 15 12 9 10   CREATININE 0.90 0.77 0.60 0.59 0.54  CALCIUM 7.8* 7.8* 7.5* 7.5* 7.4*  MG  --   --   --  1.5* 1.9   Liver Function Tests: No results for input(s): AST, ALT, ALKPHOS, BILITOT, PROT, ALBUMIN in the last 168 hours. No results for input(s): LIPASE, AMYLASE in the last 168 hours. No results for input(s): AMMONIA in the last 168 hours. CBC:  Recent Labs Lab 04/16/15 1930 04/17/15 0325 04/18/15 0627    WBC 10.0 7.4 8.5  NEUTROABS 8.2*  --   --   HGB 12.7 12.2 10.5*  HCT 39.1 36.5 31.2*  MCV 88.9 87.7 89.1  PLT 223 198 193   Cardiac Enzymes: No results for input(s): CKTOTAL, CKMB, CKMBINDEX, TROPONINI in the last 168 hours. BNP: BNP (last 3 results) No results for input(s): BNP in the last 8760 hours.  ProBNP (last 3 results) No results for input(s): PROBNP in  the last 8760 hours.  CBG: No results for input(s): GLUCAP in the last 168 hours.     Signed:  Annita Brod  [facility] 04/23/2015, 12:26 PM

## 2015-04-24 DIAGNOSIS — I480 Paroxysmal atrial fibrillation: Secondary | ICD-10-CM | POA: Diagnosis not present

## 2015-04-24 DIAGNOSIS — S72002A Fracture of unspecified part of neck of left femur, initial encounter for closed fracture: Secondary | ICD-10-CM | POA: Diagnosis not present

## 2015-04-24 DIAGNOSIS — E871 Hypo-osmolality and hyponatremia: Secondary | ICD-10-CM | POA: Diagnosis not present

## 2015-04-24 DIAGNOSIS — I509 Heart failure, unspecified: Secondary | ICD-10-CM | POA: Diagnosis not present

## 2015-04-24 DIAGNOSIS — F0390 Unspecified dementia without behavioral disturbance: Secondary | ICD-10-CM | POA: Diagnosis not present

## 2015-05-13 ENCOUNTER — Ambulatory Visit (INDEPENDENT_AMBULATORY_CARE_PROVIDER_SITE_OTHER): Payer: Medicare Other | Admitting: Cardiovascular Disease

## 2015-05-13 ENCOUNTER — Encounter: Payer: Self-pay | Admitting: Cardiovascular Disease

## 2015-05-13 VITALS — BP 90/52 | HR 72 | Ht 67.0 in

## 2015-05-13 DIAGNOSIS — I4819 Other persistent atrial fibrillation: Secondary | ICD-10-CM

## 2015-05-13 DIAGNOSIS — I481 Persistent atrial fibrillation: Secondary | ICD-10-CM

## 2015-05-13 DIAGNOSIS — I495 Sick sinus syndrome: Secondary | ICD-10-CM

## 2015-05-13 DIAGNOSIS — I480 Paroxysmal atrial fibrillation: Secondary | ICD-10-CM | POA: Diagnosis not present

## 2015-05-13 DIAGNOSIS — E43 Unspecified severe protein-calorie malnutrition: Secondary | ICD-10-CM

## 2015-05-13 DIAGNOSIS — I951 Orthostatic hypotension: Secondary | ICD-10-CM

## 2015-05-13 DIAGNOSIS — F0390 Unspecified dementia without behavioral disturbance: Secondary | ICD-10-CM

## 2015-05-13 DIAGNOSIS — I5022 Chronic systolic (congestive) heart failure: Secondary | ICD-10-CM | POA: Diagnosis not present

## 2015-05-13 DIAGNOSIS — Z950 Presence of cardiac pacemaker: Secondary | ICD-10-CM

## 2015-05-13 MED ORDER — ATENOLOL 25 MG PO TABS: 25.0000 mg | ORAL_TABLET | Freq: Every day | ORAL | Status: DC

## 2015-05-13 NOTE — Progress Notes (Signed)
Patient ID: Jamie Daugherty, female   DOB: 1919/02/15, 82 y.o.   MRN: [id number]     Cardiology Office Note    Date:  05/13/2015   ID:  Jamie Daugherty, DOB [birth year], MRN [id number]  PCP:  Stephens Shire, MD  Cardiologist:   Sanda Klein, MD   Chief Complaint  Patient presents with  . Pacemaker Check    pt son states when she stands up she starts to faint, but not sure of any chest pain    History of Present Illness:  Reyhana Daugherty is a 82 y.o. female with tachycardia related cardiomyopathy and systolic heart failure related to atrial fibrillation with rapid ventricular response, also with a history of tachycardia-bradycardia syndrome for which received a dual-chamber [employer] pacemaker in 2013.   For most of 2016 she had a lot of problems with atrial fibrillation rapid ventricular response leading to heart failure decompensation. Eventually we had to start amiodarone for rate control. Once better rate control was achieved there was evidence of slow improvement in left ventricular systolic function, from minimum EF of 25% in August to 35% in November 2016. She actually returned to normal sinus rhythm in September 2016. Also in September, Anticoagulation with Xarelto was stopped due to severe epistaxis requiring transfusion. In view of her advanced age and frailty, the decision was made not to restart anticoagulation.  On April 16, 2015 she was hospitalized with a left femoral neck fracture, possibly due to a fall (her dementia does not allow accurate history). She underwent hip replacement on March 2.  She is currently a resident at [facility].  Most recently her biggest problem has been recurrent near-syncope and syncope, often when she stands up, off and on the toilet, associated with marked orthostatic hypotension. This has limited her ability to participate in rehabilitation.  She has evidence of hypothyroidism probably worsened by amiodarone. The dose of levothyroxine has  been adjusted and she had a normal TSH in February 2017. A recent random cortisol level was fairly high.  Past Medical History  Diagnosis Date  . Hypertension   . Atrial fibrillation ([place])   . Syncope   . Pacemaker - dual chamber [facility] implanted 2013 01/25/2013    Past Surgical History  Procedure Laterality Date  . Pacemaker insertion    . Abdominal hysterectomy    . Anterior approach hemi hip arthroplasty Left 04/17/2015    Procedure: ANTERIOR APPROACH LEFT HEMI HIP ARTHROPLASTY;  Surgeon: Leandrew Koyanagi, MD;  Location: [facility];  Service: Orthopedics;  Laterality: Left;    Outpatient Prescriptions Prior to Visit  Medication Sig Dispense Refill  . amiodarone (PACERONE) 200 MG tablet Take 1 tablet (200 mg total) by mouth daily. 90 tablet 3  . enoxaparin (LOVENOX) 40 MG/0.4ML injection Inject 0.4 mLs (40 mg total) into the skin daily. 14 Syringe 0  . levothyroxine (SYNTHROID, LEVOTHROID) 75 MCG tablet Take 1 tablet (75 mcg total) by mouth daily before breakfast. 30 tablet 6  . oxyCODONE-acetaminophen (PERCOCET) 5-325 MG tablet Take 1-2 tablets by mouth every 4 (four) hours as needed for severe pain. 90 tablet 0  . polyethylene glycol (MIRALAX / GLYCOLAX) packet Take 17 g by mouth daily. 14 each 0  . atenolol (TENORMIN) 50 MG tablet Take 1 tablet (50 mg total) by mouth daily. 90 tablet 3  . diltiazem (CARDIZEM LA) 120 MG 24 hr tablet Take 1 tablet (120 mg total) by mouth daily. 30 tablet 11   No facility-administered medications prior to visit.  Allergies:   Penicillins   Social History   Social History  . Marital Status: Unknown    Spouse Name: N/A  . Number of Children: N/A  . Years of Education: N/A   Social History Main Topics  . Smoking status: Former Research scientist (life sciences)  . Smokeless tobacco: None  . Alcohol Use: Yes     Comment: occ  . Drug Use: No  . Sexual Activity: Not Asked   Other Topics Concern  . None   Social History Narrative     Family History:  The  patient's family history includes Heart disease in her father and mother.   ROS:   Please see the history of present illness.    ROS All other systems reviewed and are negative.   PHYSICAL EXAM:   VS:  BP 90/52 mmHg  Pulse 72  Ht 5\' 7"  (1.702 m)  Wt    GEN: Very thin, frail, unable to stand on scales, in no acute distress HEENT: normal Neck: no JVD, carotid bruits, or masses Cardiac: RRR; no murmurs, rubs, or gallops,no edema, healthy pacemaker site Respiratory:  clear to auscultation bilaterally, normal work of breathing GI: soft, nontender, nondistended, + BS MS: no deformity or atrophy Skin: warm and dry, no rash Neuro:  Alert and Oriented x 3, Strength and sensation are intact Psych: euthymic mood, full affect  Wt Readings from Last 3 Encounters:  12/23/14 44.634 kg (98 lb 6.4 oz)  11/12/14 43.545 kg (96 lb)  11/01/14 48.172 kg (106 lb 3.2 oz)      Studies/Labs Reviewed:   EKG:  EKG is not ordered today.   Recent Labs: 11/12/2014: ALT 19 04/04/2015: TSH 3.55 04/18/2015: Platelets 193 04/23/2015: BUN 10; Creatinine, Ser 0.54; Hemoglobin 9.7*; Magnesium 1.9; Potassium 3.5; Sodium 133*   Lipid Panel No results found for: CHOL, TRIG, HDL, CHOLHDL, VLDL, LDLCALC, LDLDIRECT  Additional studies/ records that were reviewed today include:  Records from hospitalization    ASSESSMENT:    1. Persistent atrial fibrillation ([place])   2. Chronic systolic heart failure ([place])   3. Protein-calorie malnutrition, severe ([place])   4. SSS (sick sinus syndrome) ([place])   5. Pacemaker - dual chamber [employer] implanted 2013   6. Dementia without behavioral disturbance   7. Orthostatic hypotension      PLAN:  In order of problems listed above:  1. AFib: Currently normal sinus rhythm for the last 2 months. We'll continue amiodarone for both rhythm and rate control but decrease the doses of AV node blocking agents. Stop diltiazem which is a negative inotrope. Reduce atenolol to  half but do not stop to avoid rebound phenomenon. This should allow her blood pressure to increase and hopefully will prevent future episodes of hypotension and syncope. Also encourage her to stay very well-hydrated, while avoiding excessive sodium in her diet. Embolic risk is high (CHADSVasc 5: age 37, gender, HTN, CHF), but unfortunately she cannot take anticoagulation due to age/frailty/serious recent bleeding requiring transfusion. In fact, it is probably a good thing that she was not on anticoagulants when she had her recent fall. She seems to be currently tolerating a low dose of enoxaparin without bleeding 2. CHF:  She currently appears to be euvolemic, without the need for diuretics. Suspect that her ejection fraction may be even better than it was last November. Hopefully, EF is back to normal(2014 EF 55-60%). She cannot receive Ace inhibitors or angiotensin receptor blockers due to hypotension. We'll discontinue diltiazem. 3. Underweight: She is very slender  and I'm sure this is contributing to her hypotension. 4. SSS:   5. s/p PM: Normal pacemaker function, no device reprogramming was necessary today. She is not pacemaker dependent 6. Orthostatic hypotension will improve with reduction in atenolol and discontinuation of diltiazem; hopefully this will allow more meaningful participation in rehabilitation 7. Dementia: Little change. Does impede history and review of systems.    Medication Adjustments/Labs and Tests Ordered: Current medicines are reviewed at length with the patient today.  Concerns regarding medicines are outlined above.  Medication changes, Labs and Tests ordered today are listed in the Patient Instructions below. Patient Instructions  Dr Sallyanne Kuster has recommended making the following medication changes: 1. DECREASE Atenolol to 25 mg daily 2. STOP Diltiazem  Dr Sallyanne Kuster recommends that you schedule a follow-up appointment in 3 months with a device check.  If you need a  refill on your cardiac medications before your next appointment, please call your pharmacy.      Mikael Spray, MD  05/13/2015 6:53 PM    [facility] [address], [place], [place]  [address] Phone: [phone]; Fax: [phone]

## 2015-05-13 NOTE — Patient Instructions (Signed)
Dr Sallyanne Kuster has recommended making the following medication changes: 1. DECREASE Atenolol to 25 mg daily 2. STOP Diltiazem  Dr Sallyanne Kuster recommends that you schedule a follow-up appointment in 3 months with a device check.  If you need a refill on your cardiac medications before your next appointment, please call your pharmacy.

## 2015-05-19 ENCOUNTER — Encounter: Payer: Self-pay | Admitting: Cardiovascular Disease

## 2015-05-29 DIAGNOSIS — I509 Heart failure, unspecified: Secondary | ICD-10-CM | POA: Diagnosis not present

## 2015-05-29 DIAGNOSIS — I4891 Unspecified atrial fibrillation: Secondary | ICD-10-CM | POA: Diagnosis not present

## 2015-05-29 DIAGNOSIS — G301 Alzheimer's disease with late onset: Secondary | ICD-10-CM | POA: Diagnosis not present

## 2015-05-29 DIAGNOSIS — E871 Hypo-osmolality and hyponatremia: Secondary | ICD-10-CM | POA: Diagnosis not present

## 2015-06-03 DIAGNOSIS — S9001XA Contusion of right ankle, initial encounter: Secondary | ICD-10-CM | POA: Diagnosis not present

## 2015-06-05 DIAGNOSIS — I10 Essential (primary) hypertension: Secondary | ICD-10-CM | POA: Diagnosis not present

## 2015-06-05 DIAGNOSIS — I5022 Chronic systolic (congestive) heart failure: Secondary | ICD-10-CM | POA: Diagnosis not present

## 2015-06-05 DIAGNOSIS — Z9181 History of falling: Secondary | ICD-10-CM | POA: Diagnosis not present

## 2015-06-05 DIAGNOSIS — I4891 Unspecified atrial fibrillation: Secondary | ICD-10-CM | POA: Diagnosis not present

## 2015-06-05 DIAGNOSIS — S72002D Fracture of unspecified part of neck of left femur, subsequent encounter for closed fracture with routine healing: Secondary | ICD-10-CM | POA: Diagnosis not present

## 2015-06-05 DIAGNOSIS — E039 Hypothyroidism, unspecified: Secondary | ICD-10-CM | POA: Diagnosis not present

## 2015-06-05 DIAGNOSIS — F0390 Unspecified dementia without behavioral disturbance: Secondary | ICD-10-CM | POA: Diagnosis not present

## 2015-06-05 DIAGNOSIS — G894 Chronic pain syndrome: Secondary | ICD-10-CM | POA: Diagnosis not present

## 2015-06-09 DIAGNOSIS — I5022 Chronic systolic (congestive) heart failure: Secondary | ICD-10-CM | POA: Diagnosis not present

## 2015-06-09 DIAGNOSIS — I4891 Unspecified atrial fibrillation: Secondary | ICD-10-CM | POA: Diagnosis not present

## 2015-06-09 DIAGNOSIS — I10 Essential (primary) hypertension: Secondary | ICD-10-CM | POA: Diagnosis not present

## 2015-06-09 DIAGNOSIS — S72002D Fracture of unspecified part of neck of left femur, subsequent encounter for closed fracture with routine healing: Secondary | ICD-10-CM | POA: Diagnosis not present

## 2015-06-09 DIAGNOSIS — G894 Chronic pain syndrome: Secondary | ICD-10-CM | POA: Diagnosis not present

## 2015-06-09 DIAGNOSIS — F0390 Unspecified dementia without behavioral disturbance: Secondary | ICD-10-CM | POA: Diagnosis not present

## 2015-06-10 DIAGNOSIS — I5022 Chronic systolic (congestive) heart failure: Secondary | ICD-10-CM | POA: Diagnosis not present

## 2015-06-10 DIAGNOSIS — G894 Chronic pain syndrome: Secondary | ICD-10-CM | POA: Diagnosis not present

## 2015-06-10 DIAGNOSIS — I4891 Unspecified atrial fibrillation: Secondary | ICD-10-CM | POA: Diagnosis not present

## 2015-06-10 DIAGNOSIS — F0390 Unspecified dementia without behavioral disturbance: Secondary | ICD-10-CM | POA: Diagnosis not present

## 2015-06-10 DIAGNOSIS — S72002D Fracture of unspecified part of neck of left femur, subsequent encounter for closed fracture with routine healing: Secondary | ICD-10-CM | POA: Diagnosis not present

## 2015-06-10 DIAGNOSIS — I10 Essential (primary) hypertension: Secondary | ICD-10-CM | POA: Diagnosis not present

## 2015-06-11 DIAGNOSIS — G894 Chronic pain syndrome: Secondary | ICD-10-CM | POA: Diagnosis not present

## 2015-06-11 DIAGNOSIS — I10 Essential (primary) hypertension: Secondary | ICD-10-CM | POA: Diagnosis not present

## 2015-06-11 DIAGNOSIS — F0390 Unspecified dementia without behavioral disturbance: Secondary | ICD-10-CM | POA: Diagnosis not present

## 2015-06-11 DIAGNOSIS — S72002D Fracture of unspecified part of neck of left femur, subsequent encounter for closed fracture with routine healing: Secondary | ICD-10-CM | POA: Diagnosis not present

## 2015-06-11 DIAGNOSIS — I5022 Chronic systolic (congestive) heart failure: Secondary | ICD-10-CM | POA: Diagnosis not present

## 2015-06-11 DIAGNOSIS — I4891 Unspecified atrial fibrillation: Secondary | ICD-10-CM | POA: Diagnosis not present

## 2015-06-16 DIAGNOSIS — F0390 Unspecified dementia without behavioral disturbance: Secondary | ICD-10-CM | POA: Diagnosis not present

## 2015-06-16 DIAGNOSIS — I10 Essential (primary) hypertension: Secondary | ICD-10-CM | POA: Diagnosis not present

## 2015-06-16 DIAGNOSIS — G894 Chronic pain syndrome: Secondary | ICD-10-CM | POA: Diagnosis not present

## 2015-06-16 DIAGNOSIS — S72002D Fracture of unspecified part of neck of left femur, subsequent encounter for closed fracture with routine healing: Secondary | ICD-10-CM | POA: Diagnosis not present

## 2015-06-16 DIAGNOSIS — I4891 Unspecified atrial fibrillation: Secondary | ICD-10-CM | POA: Diagnosis not present

## 2015-06-16 DIAGNOSIS — I5022 Chronic systolic (congestive) heart failure: Secondary | ICD-10-CM | POA: Diagnosis not present

## 2015-06-18 DIAGNOSIS — S72002D Fracture of unspecified part of neck of left femur, subsequent encounter for closed fracture with routine healing: Secondary | ICD-10-CM | POA: Diagnosis not present

## 2015-06-18 DIAGNOSIS — F0390 Unspecified dementia without behavioral disturbance: Secondary | ICD-10-CM | POA: Diagnosis not present

## 2015-06-18 DIAGNOSIS — I5022 Chronic systolic (congestive) heart failure: Secondary | ICD-10-CM | POA: Diagnosis not present

## 2015-06-18 DIAGNOSIS — G894 Chronic pain syndrome: Secondary | ICD-10-CM | POA: Diagnosis not present

## 2015-06-18 DIAGNOSIS — I4891 Unspecified atrial fibrillation: Secondary | ICD-10-CM | POA: Diagnosis not present

## 2015-06-18 DIAGNOSIS — I10 Essential (primary) hypertension: Secondary | ICD-10-CM | POA: Diagnosis not present

## 2015-06-20 DIAGNOSIS — S72002D Fracture of unspecified part of neck of left femur, subsequent encounter for closed fracture with routine healing: Secondary | ICD-10-CM | POA: Diagnosis not present

## 2015-06-20 DIAGNOSIS — I5022 Chronic systolic (congestive) heart failure: Secondary | ICD-10-CM | POA: Diagnosis not present

## 2015-06-20 DIAGNOSIS — I10 Essential (primary) hypertension: Secondary | ICD-10-CM | POA: Diagnosis not present

## 2015-06-20 DIAGNOSIS — I4891 Unspecified atrial fibrillation: Secondary | ICD-10-CM | POA: Diagnosis not present

## 2015-06-20 DIAGNOSIS — G894 Chronic pain syndrome: Secondary | ICD-10-CM | POA: Diagnosis not present

## 2015-06-20 DIAGNOSIS — F0390 Unspecified dementia without behavioral disturbance: Secondary | ICD-10-CM | POA: Diagnosis not present

## 2015-06-21 DIAGNOSIS — I4891 Unspecified atrial fibrillation: Secondary | ICD-10-CM | POA: Diagnosis not present

## 2015-06-21 DIAGNOSIS — I10 Essential (primary) hypertension: Secondary | ICD-10-CM | POA: Diagnosis not present

## 2015-06-21 DIAGNOSIS — S72002D Fracture of unspecified part of neck of left femur, subsequent encounter for closed fracture with routine healing: Secondary | ICD-10-CM | POA: Diagnosis not present

## 2015-06-21 DIAGNOSIS — F0390 Unspecified dementia without behavioral disturbance: Secondary | ICD-10-CM | POA: Diagnosis not present

## 2015-06-21 DIAGNOSIS — G894 Chronic pain syndrome: Secondary | ICD-10-CM | POA: Diagnosis not present

## 2015-06-21 DIAGNOSIS — I5022 Chronic systolic (congestive) heart failure: Secondary | ICD-10-CM | POA: Diagnosis not present

## 2015-06-23 DIAGNOSIS — I10 Essential (primary) hypertension: Secondary | ICD-10-CM | POA: Diagnosis not present

## 2015-06-23 DIAGNOSIS — I4891 Unspecified atrial fibrillation: Secondary | ICD-10-CM | POA: Diagnosis not present

## 2015-06-23 DIAGNOSIS — S72002D Fracture of unspecified part of neck of left femur, subsequent encounter for closed fracture with routine healing: Secondary | ICD-10-CM | POA: Diagnosis not present

## 2015-06-23 DIAGNOSIS — G894 Chronic pain syndrome: Secondary | ICD-10-CM | POA: Diagnosis not present

## 2015-06-23 DIAGNOSIS — I5022 Chronic systolic (congestive) heart failure: Secondary | ICD-10-CM | POA: Diagnosis not present

## 2015-06-23 DIAGNOSIS — F0390 Unspecified dementia without behavioral disturbance: Secondary | ICD-10-CM | POA: Diagnosis not present

## 2015-06-25 DIAGNOSIS — F0390 Unspecified dementia without behavioral disturbance: Secondary | ICD-10-CM | POA: Diagnosis not present

## 2015-06-25 DIAGNOSIS — I4891 Unspecified atrial fibrillation: Secondary | ICD-10-CM | POA: Diagnosis not present

## 2015-06-25 DIAGNOSIS — S72002D Fracture of unspecified part of neck of left femur, subsequent encounter for closed fracture with routine healing: Secondary | ICD-10-CM | POA: Diagnosis not present

## 2015-06-25 DIAGNOSIS — G894 Chronic pain syndrome: Secondary | ICD-10-CM | POA: Diagnosis not present

## 2015-06-25 DIAGNOSIS — I10 Essential (primary) hypertension: Secondary | ICD-10-CM | POA: Diagnosis not present

## 2015-06-25 DIAGNOSIS — I5022 Chronic systolic (congestive) heart failure: Secondary | ICD-10-CM | POA: Diagnosis not present

## 2015-06-26 DIAGNOSIS — I5022 Chronic systolic (congestive) heart failure: Secondary | ICD-10-CM | POA: Diagnosis not present

## 2015-06-26 DIAGNOSIS — S72002D Fracture of unspecified part of neck of left femur, subsequent encounter for closed fracture with routine healing: Secondary | ICD-10-CM | POA: Diagnosis not present

## 2015-06-26 DIAGNOSIS — F0390 Unspecified dementia without behavioral disturbance: Secondary | ICD-10-CM | POA: Diagnosis not present

## 2015-06-26 DIAGNOSIS — I4891 Unspecified atrial fibrillation: Secondary | ICD-10-CM | POA: Diagnosis not present

## 2015-06-26 DIAGNOSIS — G894 Chronic pain syndrome: Secondary | ICD-10-CM | POA: Diagnosis not present

## 2015-06-26 DIAGNOSIS — I10 Essential (primary) hypertension: Secondary | ICD-10-CM | POA: Diagnosis not present

## 2015-06-27 DIAGNOSIS — F0390 Unspecified dementia without behavioral disturbance: Secondary | ICD-10-CM | POA: Diagnosis not present

## 2015-06-27 DIAGNOSIS — I4891 Unspecified atrial fibrillation: Secondary | ICD-10-CM | POA: Diagnosis not present

## 2015-06-27 DIAGNOSIS — I10 Essential (primary) hypertension: Secondary | ICD-10-CM | POA: Diagnosis not present

## 2015-06-27 DIAGNOSIS — G894 Chronic pain syndrome: Secondary | ICD-10-CM | POA: Diagnosis not present

## 2015-06-27 DIAGNOSIS — I5022 Chronic systolic (congestive) heart failure: Secondary | ICD-10-CM | POA: Diagnosis not present

## 2015-06-27 DIAGNOSIS — S72002D Fracture of unspecified part of neck of left femur, subsequent encounter for closed fracture with routine healing: Secondary | ICD-10-CM | POA: Diagnosis not present

## 2015-06-30 DIAGNOSIS — I4891 Unspecified atrial fibrillation: Secondary | ICD-10-CM | POA: Diagnosis not present

## 2015-06-30 DIAGNOSIS — F0390 Unspecified dementia without behavioral disturbance: Secondary | ICD-10-CM | POA: Diagnosis not present

## 2015-06-30 DIAGNOSIS — S72002D Fracture of unspecified part of neck of left femur, subsequent encounter for closed fracture with routine healing: Secondary | ICD-10-CM | POA: Diagnosis not present

## 2015-06-30 DIAGNOSIS — G894 Chronic pain syndrome: Secondary | ICD-10-CM | POA: Diagnosis not present

## 2015-06-30 DIAGNOSIS — I5022 Chronic systolic (congestive) heart failure: Secondary | ICD-10-CM | POA: Diagnosis not present

## 2015-06-30 DIAGNOSIS — I10 Essential (primary) hypertension: Secondary | ICD-10-CM | POA: Diagnosis not present

## 2015-07-02 DIAGNOSIS — I5022 Chronic systolic (congestive) heart failure: Secondary | ICD-10-CM | POA: Diagnosis not present

## 2015-07-02 DIAGNOSIS — S72002D Fracture of unspecified part of neck of left femur, subsequent encounter for closed fracture with routine healing: Secondary | ICD-10-CM | POA: Diagnosis not present

## 2015-07-02 DIAGNOSIS — I10 Essential (primary) hypertension: Secondary | ICD-10-CM | POA: Diagnosis not present

## 2015-07-02 DIAGNOSIS — F0390 Unspecified dementia without behavioral disturbance: Secondary | ICD-10-CM | POA: Diagnosis not present

## 2015-07-02 DIAGNOSIS — I4891 Unspecified atrial fibrillation: Secondary | ICD-10-CM | POA: Diagnosis not present

## 2015-07-02 DIAGNOSIS — G894 Chronic pain syndrome: Secondary | ICD-10-CM | POA: Diagnosis not present

## 2015-07-03 DIAGNOSIS — I10 Essential (primary) hypertension: Secondary | ICD-10-CM | POA: Diagnosis not present

## 2015-07-03 DIAGNOSIS — F0390 Unspecified dementia without behavioral disturbance: Secondary | ICD-10-CM | POA: Diagnosis not present

## 2015-07-03 DIAGNOSIS — I4891 Unspecified atrial fibrillation: Secondary | ICD-10-CM | POA: Diagnosis not present

## 2015-07-03 DIAGNOSIS — S72002D Fracture of unspecified part of neck of left femur, subsequent encounter for closed fracture with routine healing: Secondary | ICD-10-CM | POA: Diagnosis not present

## 2015-07-03 DIAGNOSIS — G894 Chronic pain syndrome: Secondary | ICD-10-CM | POA: Diagnosis not present

## 2015-07-03 DIAGNOSIS — I5022 Chronic systolic (congestive) heart failure: Secondary | ICD-10-CM | POA: Diagnosis not present

## 2015-07-04 DIAGNOSIS — I4891 Unspecified atrial fibrillation: Secondary | ICD-10-CM | POA: Diagnosis not present

## 2015-07-04 DIAGNOSIS — S72002D Fracture of unspecified part of neck of left femur, subsequent encounter for closed fracture with routine healing: Secondary | ICD-10-CM | POA: Diagnosis not present

## 2015-07-04 DIAGNOSIS — G894 Chronic pain syndrome: Secondary | ICD-10-CM | POA: Diagnosis not present

## 2015-07-04 DIAGNOSIS — I10 Essential (primary) hypertension: Secondary | ICD-10-CM | POA: Diagnosis not present

## 2015-07-04 DIAGNOSIS — F0390 Unspecified dementia without behavioral disturbance: Secondary | ICD-10-CM | POA: Diagnosis not present

## 2015-07-04 DIAGNOSIS — I5022 Chronic systolic (congestive) heart failure: Secondary | ICD-10-CM | POA: Diagnosis not present

## 2015-07-11 DIAGNOSIS — B9789 Other viral agents as the cause of diseases classified elsewhere: Secondary | ICD-10-CM | POA: Diagnosis not present

## 2015-07-11 DIAGNOSIS — H6123 Impacted cerumen, bilateral: Secondary | ICD-10-CM | POA: Diagnosis not present

## 2015-07-11 DIAGNOSIS — J069 Acute upper respiratory infection, unspecified: Secondary | ICD-10-CM | POA: Diagnosis not present

## 2015-07-15 DIAGNOSIS — S72042D Displaced fracture of base of neck of left femur, subsequent encounter for closed fracture with routine healing: Secondary | ICD-10-CM | POA: Diagnosis not present

## 2015-07-23 ENCOUNTER — Encounter: Payer: Self-pay | Admitting: Cardiology

## 2015-07-23 LAB — CUP PACEART INCLINIC DEVICE CHECK
Battery Remaining Longevity: 54 mo
Brady Statistic RA Percent Paced: 100 %
Date Time Interrogation Session: 20170607154202
Implantable Lead Implant Date: 20130729
Implantable Lead Location: 753860
Implantable Lead Model: 4456
Implantable Lead Serial Number: 466820
Implantable Lead Serial Number: 522401
Lead Channel Pacing Threshold Amplitude: 1 V
Lead Channel Pacing Threshold Amplitude: 1.2 V
Lead Channel Pacing Threshold Pulse Width: 0.4 ms
Lead Channel Sensing Intrinsic Amplitude: 8.4 mV
Lead Channel Setting Pacing Amplitude: 2.5 V
Lead Channel Setting Pacing Pulse Width: 0.4 ms
Lead Channel Setting Sensing Sensitivity: 1 mV
MDC IDC LEAD IMPLANT DT: 20130729
MDC IDC LEAD LOCATION: 753859
MDC IDC MSMT LEADCHNL RA IMPEDANCE VALUE: 696 Ohm
MDC IDC MSMT LEADCHNL RA PACING THRESHOLD PULSEWIDTH: 0.4 ms
MDC IDC MSMT LEADCHNL RA SENSING INTR AMPL: 4.1 mV
MDC IDC MSMT LEADCHNL RV IMPEDANCE VALUE: 405 Ohm
MDC IDC SET LEADCHNL RA PACING AMPLITUDE: 2.6 V
MDC IDC STAT BRADY RV PERCENT PACED: 10 %
Pulse Gen Serial Number: 131216

## 2015-07-29 ENCOUNTER — Telehealth: Payer: Self-pay | Admitting: Cardiovascular Disease

## 2015-07-29 DIAGNOSIS — Z79899 Other long term (current) drug therapy: Secondary | ICD-10-CM

## 2015-07-29 DIAGNOSIS — E039 Hypothyroidism, unspecified: Secondary | ICD-10-CM

## 2015-07-29 NOTE — Telephone Encounter (Signed)
Please apologize for me one more time. There was a scheduling error and I am supposed to be in the hospital all day that day. Thyroid was checked in February and was OK, but it is probably worthwhile checking again. She also needs a CMET anyway, for amiodarone rx. Neither one needs to be fasting.

## 2015-07-29 NOTE — Telephone Encounter (Signed)
Spoke with daughter in [employer].  She wants to know if patient needs thyroid level checked  Patient has OV 09/04/15 - was supposed to see Dr. Loletha Grayer this Thursday and it got moved d/t him being out of the office. DIL would like patient to be seen sooner. Apologized for inconvenience and informed her they can touch base with our office later one to see if anything sooner has come available.   Will route message to MD to advise on labs

## 2015-07-29 NOTE — Telephone Encounter (Signed)
Returned call to DIL and provided MD instructions regarding lab work. She will bring patient in for non-fasting labs prior to her appointment. Apologized on MD behalf regarding scheduling issues. DIL was appreciative of MD concern/acknowledgement.   Labs ordered.

## 2015-07-29 NOTE — Telephone Encounter (Signed)
New Message  Pt dtr in law wanted to discuss w/ RN- possibly having blood work. Please call back and discuss.

## 2015-07-31 ENCOUNTER — Encounter: Payer: Medicare Other | Admitting: Cardiovascular Disease

## 2015-08-02 ENCOUNTER — Telehealth: Payer: Self-pay | Admitting: Physician Assistant

## 2015-08-02 ENCOUNTER — Emergency Department (HOSPITAL_COMMUNITY): Payer: Medicare Other

## 2015-08-02 ENCOUNTER — Emergency Department (HOSPITAL_COMMUNITY)
Admission: EM | Admit: 2015-08-02 | Discharge: 2015-08-02 | Disposition: A | Discharge status: Home / Self Care | Payer: Medicare Other | Attending: Emergency Medicine | Admitting: Emergency Medicine

## 2015-08-02 ENCOUNTER — Encounter (HOSPITAL_COMMUNITY): Payer: Self-pay

## 2015-08-02 DIAGNOSIS — I110 Hypertensive heart disease with heart failure: Secondary | ICD-10-CM | POA: Insufficient documentation

## 2015-08-02 DIAGNOSIS — Y9301 Activity, walking, marching and hiking: Secondary | ICD-10-CM | POA: Diagnosis not present

## 2015-08-02 DIAGNOSIS — Y999 Unspecified external cause status: Secondary | ICD-10-CM | POA: Insufficient documentation

## 2015-08-02 DIAGNOSIS — I951 Orthostatic hypotension: Secondary | ICD-10-CM

## 2015-08-02 DIAGNOSIS — S7002XA Contusion of left hip, initial encounter: Secondary | ICD-10-CM | POA: Diagnosis not present

## 2015-08-02 DIAGNOSIS — T148XXA Other injury of unspecified body region, initial encounter: Secondary | ICD-10-CM

## 2015-08-02 DIAGNOSIS — F0390 Unspecified dementia without behavioral disturbance: Secondary | ICD-10-CM | POA: Diagnosis not present

## 2015-08-02 DIAGNOSIS — Z87891 Personal history of nicotine dependence: Secondary | ICD-10-CM | POA: Diagnosis not present

## 2015-08-02 DIAGNOSIS — Z79899 Other long term (current) drug therapy: Secondary | ICD-10-CM | POA: Diagnosis not present

## 2015-08-02 DIAGNOSIS — Z96642 Presence of left artificial hip joint: Secondary | ICD-10-CM | POA: Diagnosis not present

## 2015-08-02 DIAGNOSIS — M25552 Pain in left hip: Secondary | ICD-10-CM

## 2015-08-02 DIAGNOSIS — Y929 Unspecified place or not applicable: Secondary | ICD-10-CM | POA: Diagnosis not present

## 2015-08-02 DIAGNOSIS — W1839XA Other fall on same level, initial encounter: Secondary | ICD-10-CM | POA: Diagnosis not present

## 2015-08-02 DIAGNOSIS — T148 Other injury of unspecified body region: Secondary | ICD-10-CM | POA: Diagnosis not present

## 2015-08-02 DIAGNOSIS — W19XXXA Unspecified fall, initial encounter: Secondary | ICD-10-CM

## 2015-08-02 DIAGNOSIS — S79912A Unspecified injury of left hip, initial encounter: Secondary | ICD-10-CM | POA: Diagnosis not present

## 2015-08-02 DIAGNOSIS — I5022 Chronic systolic (congestive) heart failure: Secondary | ICD-10-CM | POA: Diagnosis not present

## 2015-08-02 DIAGNOSIS — R55 Syncope and collapse: Secondary | ICD-10-CM | POA: Diagnosis not present

## 2015-08-02 DIAGNOSIS — Z96649 Presence of unspecified artificial hip joint: Secondary | ICD-10-CM

## 2015-08-02 DIAGNOSIS — S299XXA Unspecified injury of thorax, initial encounter: Secondary | ICD-10-CM | POA: Diagnosis not present

## 2015-08-02 LAB — CBC WITH DIFFERENTIAL/PLATELET
BASOS ABS: 0 10*3/uL (ref 0.0–0.1)
BASOS PCT: 0 %
EOS ABS: 0.1 10*3/uL (ref 0.0–0.7)
EOS PCT: 1 %
HEMATOCRIT: 37.4 % (ref 36.0–46.0)
Hemoglobin: 11.9 g/dL — ABNORMAL LOW (ref 12.0–15.0)
Lymphocytes Relative: 12 %
Lymphs Abs: 1.1 10*3/uL (ref 0.7–4.0)
MCH: 27.6 pg (ref 26.0–34.0)
MCHC: 31.8 g/dL (ref 30.0–36.0)
MCV: 86.8 fL (ref 78.0–100.0)
MONO ABS: 0.5 10*3/uL (ref 0.1–1.0)
MONOS PCT: 6 %
NEUTROS ABS: 8 10*3/uL — AB (ref 1.7–7.7)
Neutrophils Relative %: 81 %
PLATELETS: 228 10*3/uL (ref 150–400)
RBC: 4.31 MIL/uL (ref 3.87–5.11)
RDW: 14.5 % (ref 11.5–15.5)
WBC: 9.8 10*3/uL (ref 4.0–10.5)

## 2015-08-02 LAB — COMPREHENSIVE METABOLIC PANEL
ALBUMIN: 3.1 g/dL — AB (ref 3.5–5.0)
ALT: 15 U/L (ref 14–54)
ANION GAP: 6 (ref 5–15)
AST: 21 U/L (ref 15–41)
Alkaline Phosphatase: 84 U/L (ref 38–126)
BUN: 10 mg/dL (ref 6–20)
CHLORIDE: 99 mmol/L — AB (ref 101–111)
CO2: 27 mmol/L (ref 22–32)
Calcium: 8.3 mg/dL — ABNORMAL LOW (ref 8.9–10.3)
Creatinine, Ser: 0.79 mg/dL (ref 0.44–1.00)
GFR calc Af Amer: >60 mL/min (ref >60)
GFR calc non Af Amer: >60 mL/min (ref >60)
GLUCOSE: 111 mg/dL — AB (ref 65–99)
POTASSIUM: 3.9 mmol/L (ref 3.5–5.1)
SODIUM: 132 mmol/L — AB (ref 135–145)
TOTAL PROTEIN: 6.6 g/dL (ref 6.5–8.1)
Total Bilirubin: 0.4 mg/dL (ref 0.3–1.2)

## 2015-08-02 LAB — URINALYSIS, ROUTINE W REFLEX MICROSCOPIC
Bilirubin Urine: NEGATIVE
GLUCOSE, UA: NEGATIVE mg/dL
HGB URINE DIPSTICK: NEGATIVE
Ketones, ur: NEGATIVE mg/dL
LEUKOCYTES UA: NEGATIVE
Nitrite: NEGATIVE
PROTEIN: NEGATIVE mg/dL
SPECIFIC GRAVITY, URINE: 1.011 (ref 1.005–1.030)
pH: 8 (ref 5.0–8.0)

## 2015-08-02 LAB — CK: CK TOTAL: 70 U/L (ref 38–234)

## 2015-08-02 MED ORDER — ACETAMINOPHEN 500 MG PO TABS
1000.0000 mg | ORAL_TABLET | Freq: Once | ORAL | Status: AC
  Administered 2015-08-02: 1000 mg via ORAL
  Filled 2015-08-02: qty 2

## 2015-08-02 MED ORDER — SODIUM CHLORIDE 0.9 % IV BOLUS (SEPSIS)
500.0000 mL | Freq: Once | INTRAVENOUS | Status: AC
  Administered 2015-08-02: 500 mL via INTRAVENOUS

## 2015-08-02 NOTE — ED Notes (Signed)
Patient transported to X-ray 

## 2015-08-02 NOTE — ED Notes (Signed)
Pt states she understands instructions. Family states they understand.Home stable with family.

## 2015-08-02 NOTE — ED Notes (Signed)
Pt arrives EMS with c/o fall on walking to br this am. Pt denied LOC to EMS but states she fainted and has a history of similar episodes with pacemaker in place. C/O pain at left hip where she had previous hip fracture.

## 2015-08-02 NOTE — ED Notes (Signed)
Pt able to apply body weight, but unable to tolerate the pain; pt only able to ambulate 2 steps before having to sit back down; used Esch to assist ambulation

## 2015-08-02 NOTE — ED Notes (Signed)
[employer] rep, J Deakins states the device is functioning normal and there is nothing to report.

## 2015-08-02 NOTE — ED Notes (Addendum)
Unable to pull labs with placement of IV. Lab aware

## 2015-08-02 NOTE — ED Notes (Addendum)
[employer] attempting to find pt under name without success.

## 2015-08-02 NOTE — ED Notes (Signed)
Pt sitting up in bed eating [place] tray. Family states they will obtain card for pacer.

## 2015-08-02 NOTE — ED Notes (Addendum)
Pt stands with assist x2 at bedside. Pt is able to bear weight on left leg and does not c/o severe pain but becomes pale and dizzy and has to be returned to bed. Immediate BP is 74/34 with heart rate 98. Dr. Billy Fischer will be informed of same.

## 2015-08-02 NOTE — ED Notes (Signed)
Warm blankets applied

## 2015-08-02 NOTE — Telephone Encounter (Signed)
Pt having problems with dizziness, orthostatic in nature. Pt fell this am walking to BR. Brought to ER. SBP 70s with standing, IVF being started.  Advised Ms Knake we would be happy to help if needed, have not been called. Sounds like pt being treated appropriately, call us with questions.   Lenoard Aden 08/02/2015 1:31 PM Beeper [phone]

## 2015-08-02 NOTE — ED Notes (Signed)
[place] contacted with serial numbers and have found patinet and will contact rep. For interrogation of pacer.

## 2015-08-02 NOTE — ED Provider Notes (Signed)
CSN: [id number]     Arrival date & time 08/02/15  [id number] History   First MD Initiated Contact with Patient 08/02/15 0848     Chief Complaint  Patient presents with  . Fall  . Hip Pain     (Consider location/radiation/quality/duration/timing/severity/associated sxs/prior Treatment) Patient is a 82 y.o. female presenting with fall and hip pain.  Fall Pertinent negatives include no chest pain, no abdominal pain, no headaches and no shortness of breath.  Hip Pain Pertinent negatives include no chest pain, no abdominal pain, no headaches and no shortness of breath.   82yo female with a history of hypertension, major fibrillation, syncopal episodes, pacemaker in place, hip fracture with prosthesis placed March 0000000, chronic systolic heart failure, dementia presents with concern for left hip pain. Patient's history is limited due to history of dementia, however reports that she got out of bed and fell down last night.  Unsure what time. Possible syncope. Patient reports she thinks she fainted. "I'm a fainter" "I faint" patient is not sure if she her head. Denies any headache, neck pain, back pain, chest pain, shortness of breath, black or bloody stools, nausea vomiting, fevers, cough.    Past Medical History  Diagnosis Date  . Hypertension   . Atrial fibrillation ([place])   . Syncope   . Pacemaker - dual chamber [facility] implanted 2013 01/25/2013   Past Surgical History  Procedure Laterality Date  . Pacemaker insertion    . Abdominal hysterectomy    . Anterior approach hemi hip arthroplasty Left 04/17/2015    Procedure: ANTERIOR APPROACH LEFT HEMI HIP ARTHROPLASTY;  Surgeon: Leandrew Koyanagi, MD;  Location: [place];  Service: Orthopedics;  Laterality: Left;   Family History  Problem Relation Age of Onset  . Heart disease Mother   . Heart disease Father    Social History  Substance Use Topics  . Smoking status: Former Research scientist (life sciences)  . Smokeless tobacco: None  . Alcohol Use: Yes     Comment:  occ   OB History    No data available     Review of Systems  Constitutional: Negative for fever.  HENT: Negative for sore throat.   Eyes: Negative for visual disturbance.  Respiratory: Negative for cough and shortness of breath.   Cardiovascular: Negative for chest pain.  Gastrointestinal: Negative for nausea, vomiting, abdominal pain, diarrhea, constipation and blood in stool.  Genitourinary: Negative for difficulty urinating.  Musculoskeletal: Positive for arthralgias. Negative for back pain and neck pain.  Skin: Negative for rash.  Neurological: Positive for syncope ("i fainted" "i'm a fainter") and light-headedness (when going from sitting to standing). Negative for dizziness, facial asymmetry, weakness, numbness and headaches.      Allergies  Penicillins  Home Medications   Prior to Admission medications   Medication Sig Start Date End Date Taking? Authorizing Provider  amiodarone (PACERONE) 200 MG tablet Take 1 tablet (200 mg total) by mouth daily. 10/21/14  Yes Mihai Croitoru, MD  atenolol (TENORMIN) 25 MG tablet Take 1 tablet (25 mg total) by mouth daily. 05/13/15  Yes Mihai Croitoru, MD  levothyroxine (SYNTHROID, LEVOTHROID) 75 MCG tablet Take 1 tablet (75 mcg total) by mouth daily before breakfast. 02/18/15  Yes Mihai Croitoru, MD  enoxaparin (LOVENOX) 40 MG/0.4ML injection Inject 0.4 mLs (40 mg total) into the skin daily. 04/17/15   Naiping Ephriam Jenkins, MD  oxyCODONE-acetaminophen (PERCOCET) 5-325 MG tablet Take 1-2 tablets by mouth every 4 (four) hours as needed for severe pain. 04/17/15   [place]  Ephriam Jenkins, MD  polyethylene glycol [facility] / GLYCOLAX) packet Take 17 g by mouth daily. 04/23/15   Annita Brod, MD   BP 147/73 mmHg  Pulse 70  Temp(Src) 98.6 F (37 C) (Oral)  Resp 15  Ht 5\' 7"  (1.702 m)  Wt 98 lb (44.453 kg)  BMI 15.35 kg/m2  SpO2 98% Physical Exam  Constitutional: She is oriented to person, place, and time. She appears well-developed and well-nourished. No  distress.  HENT:  Head: Normocephalic and atraumatic.  Eyes: Conjunctivae and EOM are normal.  Neck: Normal range of motion.  Cardiovascular: Normal rate, regular rhythm, normal heart sounds and intact distal pulses.  Exam reveals no gallop and no friction rub.   No murmur heard. Pulmonary/Chest: Effort normal and breath sounds normal. No respiratory distress. She has no wheezes. She has no rales.  Abdominal: Soft. She exhibits no distension. There is no tenderness. There is no guarding.  Musculoskeletal: She exhibits no edema.       Right hip: She exhibits normal range of motion.       Left hip: She exhibits decreased range of motion (pain) and tenderness. She exhibits no swelling, no crepitus, no deformity and no laceration.       Left knee: She exhibits normal range of motion.       Left ankle: She exhibits normal range of motion, no swelling and no ecchymosis.  Neurological: She is alert and oriented to person, place, and time.  Skin: Skin is warm and dry. No rash noted. She is not diaphoretic. No erythema.  Nursing note and vitals reviewed.   ED Course  Procedures (including critical care time) Labs Review Labs Reviewed  CBC WITH DIFFERENTIAL/PLATELET - Abnormal; Notable for the following:    Hemoglobin 11.9 (*)    Neutro Abs 8.0 (*)    All other components within normal limits  COMPREHENSIVE METABOLIC PANEL - Abnormal; Notable for the following:    Sodium 132 (*)    Chloride 99 (*)    Glucose, Bld 111 (*)    Calcium 8.3 (*)    Albumin 3.1 (*)    All other components within normal limits  URINALYSIS, ROUTINE W REFLEX MICROSCOPIC (NOT AT [facility])  CK    Imaging Review Dg Chest 2 View  08/02/2015  CLINICAL DATA:  Fall. EXAM: CHEST  2 VIEW COMPARISON:  Radiograph of April 18, 2015 and November 05, 2012. FINDINGS: The heart size and mediastinal contours are within normal limits. Left lung is clear. Stable right apical density is noted most consistent with scarring. No  pneumothorax or pleural effusion is noted. Left-sided pacemaker is unchanged in position. Atherosclerosis of thoracic aorta is noted. The visualized skeletal structures are unremarkable. IMPRESSION: No active cardiopulmonary disease. Electronically Signed   By: Marijo Conception, M.D.   On: 08/02/2015 10:50   Ct Head Wo Contrast  08/02/2015  CLINICAL DATA:  Fall without reported loss of consciousness. EXAM: CT HEAD WITHOUT CONTRAST TECHNIQUE: Contiguous axial images were obtained from the base of the skull through the vertex without intravenous contrast. COMPARISON:  None. FINDINGS: Bony calvarium appears intact. Mild diffuse cortical atrophy is noted. Mild chronic ischemic white matter disease is noted. No mass effect or midline shift is noted. Ventricular size is within normal limits. There is no evidence of mass lesion, hemorrhage or acute infarction. IMPRESSION: Mild diffuse cortical atrophy. Mild chronic ischemic white matter disease. No acute intracranial abnormality seen. Electronically Signed   By: Marijo Conception, M.D.  On: 08/02/2015 09:59   Dg Hip Unilat With Pelvis 2-3 Views Left  08/02/2015  CLINICAL DATA:  Left hip pain after fall. EXAM: DG HIP (WITH OR WITHOUT PELVIS) 2-3V LEFT COMPARISON:  Radiographs of April 17, 2015. FINDINGS: Status post left hemiarthroplasty. No fracture or dislocation is noted. Prosthesis appears to be well situated. Right hip appears normal. IMPRESSION: No acute abnormality seen in the left hip. Electronically Signed   By: Marijo Conception, M.D.   On: 08/02/2015 10:52   I have personally reviewed and evaluated these images and lab results as part of my medical decision-making.   EKG Interpretation   Date/Time:  Saturday August 02 2015 08:42:24 EDT Ventricular Rate:  70 PR Interval:  237 QRS Duration: 143 QT Interval:  509 QTC Calculation: 549 R Axis:   -51 Text Interpretation:  Sinus rhythm Multiform ventricular premature  complexes Prolonged PR interval  Nonspecific IVCD with LAD LVH with  secondary repolarization abnormality No significant change since last  tracing , pacer spikes no longer visible Confirmed by [facility] MD, Rola Lennon  ([id number]) on 08/02/2015 6:16:27 PM     Dual Chamber Pacemaker Check, Normal function, no episodes [employer] MDM   Final diagnoses:  Fall, initial encounter  Hip joint replacement status  Left hip pain  Contusion  Orthostatic hypotension   82yo female with a history of hypertension, major fibrillation, syncopal episodes, pacemaker in place, hip fracture with prosthesis placed March 0000000, chronic systolic heart failure, dementia presents with concern for left hip pain. Patient's history is limited due to history of dementia, however reports that she got out of bed and fell down last night.  Possible syncope. She was found laying on the ground in her nightgown, and was hypothermic on arrival to the emergency department with a temperature of 94.9. Patient was screened for signs of infection, and has no sign of UTI, no pneumonia, no erythema around the hip. Patient was warmed with blankets, with return of normal temperature, and suspect environmental cause of hypothermia secondary to patient being exposed on the ground in her apartment.  No history or signs of GI bleed, no history of infection, no signs of stroke on exam.  CT head without acute abnormalities. Patient without other areas of tenderness on exam. X-ray of the left hip was obtained which showed no evidence of fracture.  Initially, stood patient up, and she was able to bear weight on her left hip, however became pale, and blood pressure is dropped to 70 systolic. Patient had a return of blood pressures immediately returned to the [id number] and [id number] systolic. She is given 500 mL of normal saline, and neck repeat orthostatics, was able to maintain her blood pressures. Patient's orthostatic hypotension likely secondary to mild dehydration, age, and  medications. Recommend close PCP follow-up for further recommendations. Given chronic lightheadedness with standing, and patient who has had a fall and lives alone, recommended 24-hour care for patient.  Family reports that they will be able to stay with her until assisted living or nursing facilities organized.  Patient's pacemaker was interrogated as above, and showed no evidence of arrhythmia.  Patient was able to stand and walk approximately 2 steps however reports pain. Given this, had decreased suspicion for occult fracture of the hip, however discussed in detail with family that this is a possibility. Overall suspect contusion. Discuss close orthopedic follow-up, and if pain worsens or does not improve to seek additional imaging such as CT or MR (unknown at this time  if pacemaker is ok for MRI.)  Recommend tylenol for pain.  Patient improved, stable for outpt follow up with PCP and Orthopedics, monitoring symptoms. Family to stay with pt as further 24hr care is arranged.        Gareth Morgan, MD 08/02/15 1821

## 2015-08-05 ENCOUNTER — Ambulatory Visit
Admission: RE | Admit: 2015-08-05 | Discharge: 2015-08-05 | Disposition: A | Discharge status: Home / Self Care | Payer: Medicare Other | Source: Ambulatory Visit | Attending: Orthopaedic Surgery | Admitting: Orthopaedic Surgery

## 2015-08-05 ENCOUNTER — Other Ambulatory Visit: Payer: Self-pay | Admitting: Orthopaedic Surgery

## 2015-08-05 ENCOUNTER — Telehealth: Payer: Self-pay | Admitting: General Practice

## 2015-08-05 DIAGNOSIS — T148XXA Other injury of unspecified body region, initial encounter: Secondary | ICD-10-CM

## 2015-08-05 DIAGNOSIS — S72002A Fracture of unspecified part of neck of left femur, initial encounter for closed fracture: Secondary | ICD-10-CM | POA: Diagnosis not present

## 2015-08-05 DIAGNOSIS — S72042D Displaced fracture of base of neck of left femur, subsequent encounter for closed fracture with routine healing: Secondary | ICD-10-CM | POA: Diagnosis not present

## 2015-08-06 ENCOUNTER — Telehealth: Payer: Self-pay | Admitting: *Deleted

## 2015-08-06 ENCOUNTER — Other Ambulatory Visit: Payer: Self-pay | Admitting: Orthopaedic Surgery

## 2015-08-06 ENCOUNTER — Encounter (HOSPITAL_COMMUNITY): Payer: Self-pay | Admitting: *Deleted

## 2015-08-06 NOTE — Progress Notes (Signed)
Spoke with pt's daughter-in-law, [name] for pre-op call. Pt is followed by Dr. Sallyanne Kuster for A-fib and Pacemaker. She denies any recent c/o chest pain or sob by pt.

## 2015-08-07 ENCOUNTER — Inpatient Hospital Stay (HOSPITAL_COMMUNITY): Payer: Medicare Other

## 2015-08-07 ENCOUNTER — Inpatient Hospital Stay (HOSPITAL_COMMUNITY): Payer: Medicare Other | Admitting: Certified Registered Nurse Anesthetist

## 2015-08-07 ENCOUNTER — Encounter (HOSPITAL_COMMUNITY)
Admission: RE | Disposition: A | Discharge status: Skilled Nursing Facility | Payer: Self-pay | Source: Ambulatory Visit | Attending: Internal Medicine

## 2015-08-07 ENCOUNTER — Encounter (HOSPITAL_COMMUNITY): Payer: Self-pay | Admitting: Family Medicine

## 2015-08-07 ENCOUNTER — Inpatient Hospital Stay (HOSPITAL_COMMUNITY)
Admission: RE | Admit: 2015-08-07 | Discharge: 2015-08-11 | DRG: 480 | Disposition: A | Discharge status: Skilled Nursing Facility | Payer: Medicare Other | Source: Ambulatory Visit | Attending: Internal Medicine | Admitting: Internal Medicine

## 2015-08-07 DIAGNOSIS — Z681 Body mass index (BMI) 19 or less, adult: Secondary | ICD-10-CM

## 2015-08-07 DIAGNOSIS — I480 Paroxysmal atrial fibrillation: Secondary | ICD-10-CM | POA: Diagnosis not present

## 2015-08-07 DIAGNOSIS — I110 Hypertensive heart disease with heart failure: Secondary | ICD-10-CM | POA: Diagnosis present

## 2015-08-07 DIAGNOSIS — M9702XD Periprosthetic fracture around internal prosthetic left hip joint, subsequent encounter: Secondary | ICD-10-CM | POA: Diagnosis not present

## 2015-08-07 DIAGNOSIS — Z96642 Presence of left artificial hip joint: Secondary | ICD-10-CM | POA: Diagnosis present

## 2015-08-07 DIAGNOSIS — Z8249 Family history of ischemic heart disease and other diseases of the circulatory system: Secondary | ICD-10-CM

## 2015-08-07 DIAGNOSIS — Z8781 Personal history of (healed) traumatic fracture: Secondary | ICD-10-CM

## 2015-08-07 DIAGNOSIS — I5022 Chronic systolic (congestive) heart failure: Secondary | ICD-10-CM | POA: Diagnosis present

## 2015-08-07 DIAGNOSIS — E876 Hypokalemia: Secondary | ICD-10-CM | POA: Diagnosis not present

## 2015-08-07 DIAGNOSIS — S72002A Fracture of unspecified part of neck of left femur, initial encounter for closed fracture: Secondary | ICD-10-CM | POA: Diagnosis present

## 2015-08-07 DIAGNOSIS — R278 Other lack of coordination: Secondary | ICD-10-CM | POA: Diagnosis not present

## 2015-08-07 DIAGNOSIS — W19XXXA Unspecified fall, initial encounter: Secondary | ICD-10-CM | POA: Diagnosis present

## 2015-08-07 DIAGNOSIS — M1990 Unspecified osteoarthritis, unspecified site: Secondary | ICD-10-CM | POA: Diagnosis not present

## 2015-08-07 DIAGNOSIS — Z419 Encounter for procedure for purposes other than remedying health state, unspecified: Secondary | ICD-10-CM

## 2015-08-07 DIAGNOSIS — R2681 Unsteadiness on feet: Secondary | ICD-10-CM | POA: Diagnosis not present

## 2015-08-07 DIAGNOSIS — E871 Hypo-osmolality and hyponatremia: Secondary | ICD-10-CM | POA: Diagnosis present

## 2015-08-07 DIAGNOSIS — R55 Syncope and collapse: Secondary | ICD-10-CM | POA: Diagnosis present

## 2015-08-07 DIAGNOSIS — Z9181 History of falling: Secondary | ICD-10-CM

## 2015-08-07 DIAGNOSIS — E43 Unspecified severe protein-calorie malnutrition: Secondary | ICD-10-CM | POA: Diagnosis present

## 2015-08-07 DIAGNOSIS — Z880 Allergy status to penicillin: Secondary | ICD-10-CM

## 2015-08-07 DIAGNOSIS — R2689 Other abnormalities of gait and mobility: Secondary | ICD-10-CM | POA: Diagnosis not present

## 2015-08-07 DIAGNOSIS — S72009A Fracture of unspecified part of neck of unspecified femur, initial encounter for closed fracture: Secondary | ICD-10-CM | POA: Diagnosis present

## 2015-08-07 DIAGNOSIS — W19XXXD Unspecified fall, subsequent encounter: Secondary | ICD-10-CM | POA: Diagnosis not present

## 2015-08-07 DIAGNOSIS — I4891 Unspecified atrial fibrillation: Secondary | ICD-10-CM | POA: Diagnosis not present

## 2015-08-07 DIAGNOSIS — M979XXA Periprosthetic fracture around unspecified internal prosthetic joint, initial encounter: Secondary | ICD-10-CM | POA: Diagnosis not present

## 2015-08-07 DIAGNOSIS — Z87891 Personal history of nicotine dependence: Secondary | ICD-10-CM | POA: Diagnosis not present

## 2015-08-07 DIAGNOSIS — D62 Acute posthemorrhagic anemia: Secondary | ICD-10-CM | POA: Diagnosis not present

## 2015-08-07 DIAGNOSIS — I10 Essential (primary) hypertension: Secondary | ICD-10-CM | POA: Diagnosis present

## 2015-08-07 DIAGNOSIS — E039 Hypothyroidism, unspecified: Secondary | ICD-10-CM | POA: Diagnosis present

## 2015-08-07 DIAGNOSIS — I5020 Unspecified systolic (congestive) heart failure: Secondary | ICD-10-CM | POA: Diagnosis not present

## 2015-08-07 DIAGNOSIS — M6281 Muscle weakness (generalized): Secondary | ICD-10-CM | POA: Diagnosis not present

## 2015-08-07 DIAGNOSIS — F0390 Unspecified dementia without behavioral disturbance: Secondary | ICD-10-CM | POA: Diagnosis not present

## 2015-08-07 DIAGNOSIS — Z4789 Encounter for other orthopedic aftercare: Secondary | ICD-10-CM | POA: Diagnosis not present

## 2015-08-07 DIAGNOSIS — M9702XA Periprosthetic fracture around internal prosthetic left hip joint, initial encounter: Secondary | ICD-10-CM | POA: Diagnosis present

## 2015-08-07 DIAGNOSIS — D649 Anemia, unspecified: Secondary | ICD-10-CM | POA: Diagnosis not present

## 2015-08-07 DIAGNOSIS — S72042A Displaced fracture of base of neck of left femur, initial encounter for closed fracture: Secondary | ICD-10-CM | POA: Diagnosis not present

## 2015-08-07 DIAGNOSIS — S728X9A Other fracture of unspecified femur, initial encounter for closed fracture: Secondary | ICD-10-CM | POA: Diagnosis not present

## 2015-08-07 DIAGNOSIS — Z9889 Other specified postprocedural states: Secondary | ICD-10-CM

## 2015-08-07 DIAGNOSIS — S72002D Fracture of unspecified part of neck of left femur, subsequent encounter for closed fracture with routine healing: Secondary | ICD-10-CM | POA: Diagnosis not present

## 2015-08-07 DIAGNOSIS — R41841 Cognitive communication deficit: Secondary | ICD-10-CM | POA: Diagnosis not present

## 2015-08-07 DIAGNOSIS — Z950 Presence of cardiac pacemaker: Secondary | ICD-10-CM | POA: Diagnosis not present

## 2015-08-07 DIAGNOSIS — M978XXA Periprosthetic fracture around other internal prosthetic joint, initial encounter: Secondary | ICD-10-CM | POA: Diagnosis not present

## 2015-08-07 DIAGNOSIS — Z967 Presence of other bone and tendon implants: Secondary | ICD-10-CM | POA: Diagnosis not present

## 2015-08-07 HISTORY — DX: Hypothyroidism, unspecified: E03.9

## 2015-08-07 HISTORY — DX: Personal history of other medical treatment: Z92.89

## 2015-08-07 HISTORY — DX: Anemia, unspecified: D64.9

## 2015-08-07 HISTORY — PX: ORIF FEMUR FRACTURE: SHX2119

## 2015-08-07 HISTORY — DX: Unspecified osteoarthritis, unspecified site: M19.90

## 2015-08-07 LAB — BASIC METABOLIC PANEL WITH GFR
Anion gap: 9 (ref 5–15)
BUN: 10 mg/dL (ref 6–20)
CO2: 26 mmol/L (ref 22–32)
Calcium: 8.3 mg/dL — ABNORMAL LOW (ref 8.9–10.3)
Chloride: 98 mmol/L — ABNORMAL LOW (ref 101–111)
Creatinine, Ser: 0.81 mg/dL (ref 0.44–1.00)
GFR calc Af Amer: >60 mL/min
GFR calc non Af Amer: >60 mL/min
Glucose, Bld: 115 mg/dL — ABNORMAL HIGH (ref 65–99)
Potassium: 3.7 mmol/L (ref 3.5–5.1)
Sodium: 133 mmol/L — ABNORMAL LOW (ref 135–145)

## 2015-08-07 LAB — CBC
HEMATOCRIT: 33.4 % — AB (ref 36.0–46.0)
HEMOGLOBIN: 10.8 g/dL — AB (ref 12.0–15.0)
MCH: 28.5 pg (ref 26.0–34.0)
MCHC: 32.3 g/dL (ref 30.0–36.0)
MCV: 88.1 fL (ref 78.0–100.0)
Platelets: 246 10*3/uL (ref 150–400)
RBC: 3.79 MIL/uL — AB (ref 3.87–5.11)
RDW: 14.6 % (ref 11.5–15.5)
WBC: 11.2 10*3/uL — ABNORMAL HIGH (ref 4.0–10.5)

## 2015-08-07 SURGERY — OPEN REDUCTION INTERNAL FIXATION (ORIF) DISTAL FEMUR FRACTURE
Anesthesia: General | Laterality: Left

## 2015-08-07 MED ORDER — HYDRALAZINE HCL 20 MG/ML IJ SOLN: 5.0000 mg | INTRAMUSCULAR | Status: DC | PRN

## 2015-08-07 MED ORDER — LACTATED RINGERS IV SOLN
INTRAVENOUS | Status: DC
  Administered 2015-08-07: 16:00:00 via INTRAVENOUS

## 2015-08-07 MED ORDER — LIDOCAINE 2% (20 MG/ML) 5 ML SYRINGE
INTRAMUSCULAR | Status: AC
  Filled 2015-08-07: qty 10

## 2015-08-07 MED ORDER — ROCURONIUM BROMIDE 50 MG/5ML IV SOLN
INTRAVENOUS | Status: AC
  Filled 2015-08-07: qty 1

## 2015-08-07 MED ORDER — PHENOL 1.4 % MT LIQD: 1.0000 | OROMUCOSAL | Status: DC | PRN

## 2015-08-07 MED ORDER — EPHEDRINE 5 MG/ML INJ
INTRAVENOUS | Status: AC
  Filled 2015-08-07: qty 10

## 2015-08-07 MED ORDER — FENTANYL CITRATE (PF) 100 MCG/2ML IJ SOLN
25.0000 ug | INTRAMUSCULAR | Status: DC | PRN
  Administered 2015-08-07 (×3): 50 ug via INTRAVENOUS

## 2015-08-07 MED ORDER — FENTANYL CITRATE (PF) 100 MCG/2ML IJ SOLN
INTRAMUSCULAR | Status: DC | PRN
  Administered 2015-08-07: 50 ug via INTRAVENOUS

## 2015-08-07 MED ORDER — SUGAMMADEX SODIUM 200 MG/2ML IV SOLN
INTRAVENOUS | Status: DC | PRN
  Administered 2015-08-07: 100 mg via INTRAVENOUS

## 2015-08-07 MED ORDER — BUPIVACAINE HCL (PF) 0.25 % IJ SOLN
INTRAMUSCULAR | Status: AC
  Filled 2015-08-07: qty 30

## 2015-08-07 MED ORDER — ALUM & MAG HYDROXIDE-SIMETH 200-200-20 MG/5ML PO SUSP: 30.0000 mL | ORAL | Status: DC | PRN

## 2015-08-07 MED ORDER — CEFAZOLIN SODIUM-DEXTROSE 2-4 GM/100ML-% IV SOLN
2.0000 g | Freq: Four times a day (QID) | INTRAVENOUS | Status: AC
  Administered 2015-08-07 – 2015-08-08 (×3): 2 g via INTRAVENOUS
  Filled 2015-08-07 (×3): qty 100

## 2015-08-07 MED ORDER — PHENYLEPHRINE HCL 10 MG/ML IJ SOLN
INTRAMUSCULAR | Status: DC | PRN
  Administered 2015-08-07 (×2): 80 ug via INTRAVENOUS
  Administered 2015-08-07 (×5): 120 ug via INTRAVENOUS

## 2015-08-07 MED ORDER — LIDOCAINE HCL (CARDIAC) 20 MG/ML IV SOLN
INTRAVENOUS | Status: DC | PRN
  Administered 2015-08-07: 60 mg via INTRAVENOUS

## 2015-08-07 MED ORDER — SODIUM CHLORIDE 0.9 % IV SOLN: 250.0000 mL | INTRAVENOUS | Status: DC | PRN

## 2015-08-07 MED ORDER — AMIODARONE HCL 200 MG PO TABS
200.0000 mg | ORAL_TABLET | Freq: Every day | ORAL | Status: DC
  Administered 2015-08-08 – 2015-08-11 (×4): 200 mg via ORAL
  Filled 2015-08-07 (×4): qty 1

## 2015-08-07 MED ORDER — METOCLOPRAMIDE HCL 5 MG/ML IJ SOLN: 5.0000 mg | Freq: Three times a day (TID) | INTRAMUSCULAR | Status: DC | PRN

## 2015-08-07 MED ORDER — ENOXAPARIN SODIUM 40 MG/0.4ML ~~LOC~~ SOLN: 40.0000 mg | SUBCUTANEOUS | Status: DC

## 2015-08-07 MED ORDER — FENTANYL CITRATE (PF) 250 MCG/5ML IJ SOLN
INTRAMUSCULAR | Status: AC
  Filled 2015-08-07: qty 5

## 2015-08-07 MED ORDER — OXYCODONE HCL 5 MG PO TABS
5.0000 mg | ORAL_TABLET | ORAL | Status: DC | PRN
  Administered 2015-08-07: 10 mg via ORAL

## 2015-08-07 MED ORDER — HYDROCODONE-ACETAMINOPHEN 7.5-325 MG PO TABS: 1.0000 | ORAL_TABLET | Freq: Four times a day (QID) | ORAL | Status: DC | PRN

## 2015-08-07 MED ORDER — SODIUM CHLORIDE 0.9% FLUSH: 3.0000 mL | INTRAVENOUS | Status: DC | PRN

## 2015-08-07 MED ORDER — SODIUM CHLORIDE 0.9 % IV SOLN: INTRAVENOUS | Status: DC

## 2015-08-07 MED ORDER — SODIUM CHLORIDE 0.9% FLUSH
3.0000 mL | Freq: Two times a day (BID) | INTRAVENOUS | Status: DC
  Administered 2015-08-09 – 2015-08-11 (×5): 3 mL via INTRAVENOUS

## 2015-08-07 MED ORDER — ACETAMINOPHEN 10 MG/ML IV SOLN
1000.0000 mg | Freq: Once | INTRAVENOUS | Status: AC
  Administered 2015-08-07: 1000 mg via INTRAVENOUS

## 2015-08-07 MED ORDER — PROPOFOL 10 MG/ML IV BOLUS
INTRAVENOUS | Status: DC | PRN
  Administered 2015-08-07: 120 mg via INTRAVENOUS

## 2015-08-07 MED ORDER — HYDRALAZINE HCL 20 MG/ML IJ SOLN
INTRAMUSCULAR | Status: AC
  Filled 2015-08-07: qty 1

## 2015-08-07 MED ORDER — ATENOLOL 25 MG PO TABS
25.0000 mg | ORAL_TABLET | Freq: Every day | ORAL | Status: DC
  Administered 2015-08-08 – 2015-08-11 (×4): 25 mg via ORAL
  Filled 2015-08-07 (×4): qty 1

## 2015-08-07 MED ORDER — ENOXAPARIN SODIUM 40 MG/0.4ML ~~LOC~~ SOLN: 40.0000 mg | Freq: Every day | SUBCUTANEOUS | Status: DC

## 2015-08-07 MED ORDER — ONDANSETRON HCL 4 MG/2ML IJ SOLN: 4.0000 mg | Freq: Four times a day (QID) | INTRAMUSCULAR | Status: DC | PRN

## 2015-08-07 MED ORDER — 0.9 % SODIUM CHLORIDE (POUR BTL) OPTIME
TOPICAL | Status: DC | PRN
  Administered 2015-08-07: 1000 mL

## 2015-08-07 MED ORDER — OXYCODONE HCL 5 MG PO TABS: 5.0000 mg | ORAL_TABLET | Freq: Once | ORAL | Status: DC | PRN

## 2015-08-07 MED ORDER — FENTANYL CITRATE (PF) 100 MCG/2ML IJ SOLN
INTRAMUSCULAR | Status: AC
  Filled 2015-08-07: qty 2

## 2015-08-07 MED ORDER — MORPHINE SULFATE (PF) 2 MG/ML IV SOLN: 0.5000 mg | INTRAVENOUS | Status: DC | PRN

## 2015-08-07 MED ORDER — METOCLOPRAMIDE HCL 5 MG PO TABS: 5.0000 mg | ORAL_TABLET | Freq: Three times a day (TID) | ORAL | Status: DC | PRN

## 2015-08-07 MED ORDER — BISACODYL 10 MG RE SUPP: 10.0000 mg | Freq: Every day | RECTAL | Status: DC | PRN

## 2015-08-07 MED ORDER — PHENYLEPHRINE 40 MCG/ML (10ML) SYRINGE FOR IV PUSH (FOR BLOOD PRESSURE SUPPORT)
PREFILLED_SYRINGE | INTRAVENOUS | Status: AC
  Filled 2015-08-07: qty 20

## 2015-08-07 MED ORDER — ROCURONIUM BROMIDE 100 MG/10ML IV SOLN
INTRAVENOUS | Status: DC | PRN
  Administered 2015-08-07: 50 mg via INTRAVENOUS

## 2015-08-07 MED ORDER — ONDANSETRON HCL 4 MG/2ML IJ SOLN
4.0000 mg | Freq: Four times a day (QID) | INTRAMUSCULAR | Status: DC | PRN
  Administered 2015-08-07 – 2015-08-11 (×2): 4 mg via INTRAVENOUS
  Filled 2015-08-07 (×2): qty 2

## 2015-08-07 MED ORDER — PANTOPRAZOLE SODIUM 40 MG PO TBEC
40.0000 mg | DELAYED_RELEASE_TABLET | Freq: Every day | ORAL | Status: DC
  Administered 2015-08-08 – 2015-08-10 (×3): 40 mg via ORAL
  Filled 2015-08-07 (×4): qty 1

## 2015-08-07 MED ORDER — ONDANSETRON HCL 4 MG/2ML IJ SOLN: 4.0000 mg | Freq: Once | INTRAMUSCULAR | Status: DC | PRN

## 2015-08-07 MED ORDER — SODIUM CHLORIDE 0.9 % IR SOLN
Status: DC | PRN
  Administered 2015-08-07: 3000 mL

## 2015-08-07 MED ORDER — DOCUSATE SODIUM 100 MG PO CAPS
100.0000 mg | ORAL_CAPSULE | Freq: Two times a day (BID) | ORAL | Status: DC
  Administered 2015-08-07 – 2015-08-11 (×8): 100 mg via ORAL
  Filled 2015-08-07 (×8): qty 1

## 2015-08-07 MED ORDER — MORPHINE SULFATE (PF) 4 MG/ML IV SOLN
INTRAVENOUS | Status: AC
  Filled 2015-08-07: qty 1

## 2015-08-07 MED ORDER — OXYCODONE HCL 5 MG PO TABS
ORAL_TABLET | ORAL | Status: AC
  Filled 2015-08-07: qty 2

## 2015-08-07 MED ORDER — ACETAMINOPHEN 650 MG RE SUPP: 650.0000 mg | Freq: Four times a day (QID) | RECTAL | Status: DC | PRN

## 2015-08-07 MED ORDER — ONDANSETRON HCL 4 MG PO TABS: 4.0000 mg | ORAL_TABLET | Freq: Four times a day (QID) | ORAL | Status: DC | PRN

## 2015-08-07 MED ORDER — ACETAMINOPHEN 10 MG/ML IV SOLN
INTRAVENOUS | Status: AC
  Filled 2015-08-07: qty 100

## 2015-08-07 MED ORDER — ONDANSETRON HCL 4 MG/2ML IJ SOLN
INTRAMUSCULAR | Status: DC | PRN
  Administered 2015-08-07: 4 mg via INTRAVENOUS

## 2015-08-07 MED ORDER — SUGAMMADEX SODIUM 200 MG/2ML IV SOLN
INTRAVENOUS | Status: AC
  Filled 2015-08-07: qty 2

## 2015-08-07 MED ORDER — CLINDAMYCIN PHOSPHATE 900 MG/50ML IV SOLN
INTRAVENOUS | Status: AC
  Administered 2015-08-07: 900 mg via INTRAVENOUS
  Filled 2015-08-07: qty 50

## 2015-08-07 MED ORDER — POLYETHYLENE GLYCOL 3350 17 G PO PACK
17.0000 g | PACK | Freq: Every day | ORAL | Status: DC
  Administered 2015-08-08 – 2015-08-11 (×4): 17 g via ORAL
  Filled 2015-08-07 (×4): qty 1

## 2015-08-07 MED ORDER — MENTHOL 3 MG MT LOZG: 1.0000 | LOZENGE | OROMUCOSAL | Status: DC | PRN

## 2015-08-07 MED ORDER — OXYCODONE-ACETAMINOPHEN 5-325 MG PO TABS
1.0000 | ORAL_TABLET | ORAL | Status: DC | PRN
  Administered 2015-08-09: 1 via ORAL
  Filled 2015-08-07: qty 1

## 2015-08-07 MED ORDER — ACETAMINOPHEN 325 MG PO TABS
650.0000 mg | ORAL_TABLET | Freq: Four times a day (QID) | ORAL | Status: DC | PRN
  Administered 2015-08-08 – 2015-08-10 (×6): 650 mg via ORAL
  Filled 2015-08-07 (×7): qty 2

## 2015-08-07 MED ORDER — HYDROCODONE-ACETAMINOPHEN 5-325 MG PO TABS
1.0000 | ORAL_TABLET | Freq: Four times a day (QID) | ORAL | Status: DC | PRN
  Administered 2015-08-10 – 2015-08-11 (×2): 1 via ORAL
  Filled 2015-08-07 (×2): qty 1

## 2015-08-07 MED ORDER — CLINDAMYCIN PHOSPHATE 900 MG/50ML IV SOLN: 900.0000 mg | INTRAVENOUS | Status: DC

## 2015-08-07 MED ORDER — SUCCINYLCHOLINE CHLORIDE 200 MG/10ML IV SOSY
PREFILLED_SYRINGE | INTRAVENOUS | Status: AC
  Filled 2015-08-07: qty 10

## 2015-08-07 MED ORDER — LEVOTHYROXINE SODIUM 75 MCG PO TABS
75.0000 ug | ORAL_TABLET | Freq: Every day | ORAL | Status: DC
  Administered 2015-08-08 – 2015-08-11 (×4): 75 ug via ORAL
  Filled 2015-08-07 (×4): qty 1

## 2015-08-07 MED ORDER — ONDANSETRON HCL 4 MG/2ML IJ SOLN
INTRAMUSCULAR | Status: AC
  Filled 2015-08-07: qty 2

## 2015-08-07 MED ORDER — ENOXAPARIN SODIUM 30 MG/0.3ML ~~LOC~~ SOLN
30.0000 mg | SUBCUTANEOUS | Status: DC
  Administered 2015-08-08 – 2015-08-11 (×4): 30 mg via SUBCUTANEOUS
  Filled 2015-08-07 (×4): qty 0.3

## 2015-08-07 MED ORDER — METHOCARBAMOL 500 MG PO TABS
500.0000 mg | ORAL_TABLET | Freq: Four times a day (QID) | ORAL | Status: DC | PRN
  Administered 2015-08-10 (×2): 500 mg via ORAL
  Filled 2015-08-07 (×3): qty 1

## 2015-08-07 MED ORDER — DEXTROSE 5 % IV SOLN
500.0000 mg | Freq: Four times a day (QID) | INTRAVENOUS | Status: DC | PRN
  Filled 2015-08-07: qty 5

## 2015-08-07 MED ORDER — OXYCODONE HCL 5 MG/5ML PO SOLN: 5.0000 mg | Freq: Once | ORAL | Status: DC | PRN

## 2015-08-07 MED ORDER — ENOXAPARIN SODIUM 40 MG/0.4ML ~~LOC~~ SOLN: 30.0000 mg | Freq: Every day | SUBCUTANEOUS | Status: DC

## 2015-08-07 MED ORDER — PHENYLEPHRINE 40 MCG/ML (10ML) SYRINGE FOR IV PUSH (FOR BLOOD PRESSURE SUPPORT)
PREFILLED_SYRINGE | INTRAVENOUS | Status: AC
  Filled 2015-08-07: qty 10

## 2015-08-07 SURGICAL SUPPLY — 50 items
BLADE SURG ROTATE 9660 (MISCELLANEOUS) IMPLANT
BNDG COHESIVE 4X5 TAN STRL (GAUZE/BANDAGES/DRESSINGS) ×3 IMPLANT
CABLE 265 STD TROCH GRIP (Cable) ×2 IMPLANT
CABLE 265MM STD TROCH GRIP (Cable) ×1 IMPLANT
CABLE ACCORD 2.0 (Orthopedic Implant) ×15 IMPLANT
CLOSURE WOUND 1/2 X4 (GAUZE/BANDAGES/DRESSINGS) ×2
DRAPE C-ARM 42X72 X-RAY (DRAPES) ×3 IMPLANT
DRAPE C-ARMOR (DRAPES) IMPLANT
DRAPE IMP U-DRAPE 54X76 (DRAPES) IMPLANT
DRAPE ORTHO SPLIT 77X108 STRL (DRAPES) ×4
DRAPE SURG ORHT 6 SPLT 77X108 (DRAPES) ×2 IMPLANT
DRAPE U-SHAPE 47X51 STRL (DRAPES) ×3 IMPLANT
DRESSING AQUACEL AG SP 3.5X10 (GAUZE/BANDAGES/DRESSINGS) ×1 IMPLANT
DRSG AQUACEL AG SP 3.5X10 (GAUZE/BANDAGES/DRESSINGS) ×3
DRSG MEPILEX BORDER 4X12 (GAUZE/BANDAGES/DRESSINGS) ×3 IMPLANT
ELECT REM PT RETURN 9FT ADLT (ELECTROSURGICAL) ×3
ELECTRODE REM PT RTRN 9FT ADLT (ELECTROSURGICAL) ×1 IMPLANT
FLUID NSS /IRRIG 3000 ML XXX (IV SOLUTION) ×3 IMPLANT
GLOVE BIOGEL PI IND STRL 6 (GLOVE) ×1 IMPLANT
GLOVE BIOGEL PI IND STRL 6.5 (GLOVE) ×1 IMPLANT
GLOVE BIOGEL PI INDICATOR 6 (GLOVE) ×2
GLOVE BIOGEL PI INDICATOR 6.5 (GLOVE) ×2
GLOVE SKINSENSE NS SZ7.5 (GLOVE) ×4
GLOVE SKINSENSE STRL SZ7.5 (GLOVE) ×2 IMPLANT
GLOVE SURG SS PI 6.0 STRL IVOR (GLOVE) ×3 IMPLANT
GLOVE SURG SS PI 7.0 STRL IVOR (GLOVE) ×3 IMPLANT
GLOVE SURG SS PI 8.0 STRL IVOR (GLOVE) ×6 IMPLANT
GOWN STRL REIN XL XLG (GOWN DISPOSABLE) ×9 IMPLANT
KIT BASIN OR (CUSTOM PROCEDURE TRAY) ×3 IMPLANT
KIT ROOM TURNOVER OR (KITS) ×3 IMPLANT
MANIFOLD NEPTUNE II (INSTRUMENTS) ×3 IMPLANT
NS IRRIG 1000ML POUR BTL (IV SOLUTION) ×3 IMPLANT
PACK GENERAL/GYN (CUSTOM PROCEDURE TRAY) ×3 IMPLANT
PACK UNIVERSAL I (CUSTOM PROCEDURE TRAY) IMPLANT
PAD ARMBOARD 7.5X6 YLW CONV (MISCELLANEOUS) ×6 IMPLANT
STAPLER VISISTAT 35W (STAPLE) IMPLANT
STOCKINETTE IMPERVIOUS LG (DRAPES) ×3 IMPLANT
STRIP CLOSURE SKIN 1/2X4 (GAUZE/BANDAGES/DRESSINGS) ×4 IMPLANT
SUT ETHILON 2 0 FS 18 (SUTURE) ×3 IMPLANT
SUT ETHILON 3 0 PS 1 (SUTURE) ×12 IMPLANT
SUT VIC AB 0 CT1 27 (SUTURE)
SUT VIC AB 0 CT1 27XBRD ANBCTR (SUTURE) IMPLANT
SUT VIC AB 1 CTX 36 (SUTURE) ×8
SUT VIC AB 1 CTX36XBRD ANBCTR (SUTURE) ×4 IMPLANT
SUT VIC AB 2-0 CT1 (SUTURE) ×9 IMPLANT
SUT VIC AB 2-0 CT1 36 (SUTURE) ×3 IMPLANT
TOWEL OR 17X24 6PK STRL BLUE (TOWEL DISPOSABLE) ×3 IMPLANT
TOWEL OR 17X26 10 PK STRL BLUE (TOWEL DISPOSABLE) ×6 IMPLANT
TUBING CYSTO DISP (UROLOGICAL SUPPLIES) ×3 IMPLANT
WATER STERILE IRR 1000ML POUR (IV SOLUTION) IMPLANT

## 2015-08-07 NOTE — Anesthesia Preprocedure Evaluation (Addendum)
Anesthesia Evaluation  Patient identified by MRN, date of birth, ID band Patient awake    Reviewed: Allergy & Precautions, H&P , NPO status , Patient's Chart, lab work & pertinent test results, reviewed documented beta blocker date and time   History of Anesthesia Complications Negative for: history of anesthetic complications  Airway Mallampati: II  TM Distance: >3 FB Neck ROM: full    Dental no notable dental hx. (+) Dental Advisory Given, Teeth Intact   Pulmonary neg pulmonary ROS, former smoker,    Pulmonary exam normal breath sounds clear to auscultation       Cardiovascular hypertension, Pt. on home beta blockers and Pt. on medications +CHF  Normal cardiovascular exam+ dysrhythmias Atrial Fibrillation + pacemaker  Rhythm:regular Rate:Normal  LBBB  EF 30 - 35%   Neuro/Psych negative neurological ROS  negative psych ROS   GI/Hepatic negative GI ROS, Neg liver ROS,   Endo/Other  Hypothyroidism   Renal/GU negative Renal ROS  negative genitourinary   Musculoskeletal   Abdominal   Peds  Hematology negative hematology ROS (+)   Anesthesia Other Findings   Reproductive/Obstetrics negative OB ROS                            Anesthesia Physical  Anesthesia Plan  ASA: III  Anesthesia Plan: General   Post-op Pain Management:    Induction: Intravenous  Airway Management Planned: Oral ETT  Additional Equipment:   Intra-op Plan:   Post-operative Plan: Extubation in OR  Informed Consent: I have reviewed the patients History and Physical, chart, labs and discussed the procedure including the risks, benefits and alternatives for the proposed anesthesia with the patient or authorized representative who has indicated his/her understanding and acceptance.   Dental Advisory Given  Plan Discussed with: CRNA and Surgeon  Anesthesia Plan Comments: (Discussed with Son in pre-op.)        Anesthesia Quick Evaluation

## 2015-08-07 NOTE — Progress Notes (Signed)
Paged Tennis Must, whom told this nurse to page Luetta Nutting, EPT NP. Paged NP whom called back at 1205. She reported no need for special intervention or postop interrogation.

## 2015-08-07 NOTE — Anesthesia Procedure Notes (Signed)
Procedure Name: Intubation Date/Time: 08/07/2015 4:04 PM Performed by: [place] Pink Pre-anesthesia Checklist: Patient identified, Emergency Drugs available, Suction available, Patient being monitored and Timeout performed Patient Re-evaluated:Patient Re-evaluated prior to inductionOxygen Delivery Method: Circle system utilized Preoxygenation: Pre-oxygenation with 100% oxygen Intubation Type: IV induction Ventilation: Mask ventilation without difficulty Laryngoscope Size: Mac and 3 Grade View: Grade II Tube type: Oral Tube size: 7.0 mm Number of attempts: 1 Airway Equipment and Method: Stylet Placement Confirmation: ETT inserted through vocal cords under direct vision,  positive ETCO2 and breath sounds checked- equal and bilateral Secured at: 20 cm Tube secured with: Tape Dental Injury: Teeth and Oropharynx as per pre-operative assessment

## 2015-08-07 NOTE — Discharge Instructions (Signed)
° ° °  1. Change dressings as needed °2. May shower but keep incisions covered and dry °3. Take lovenox to prevent blood clots °4. Take stool softeners as needed °5. Take pain meds as needed ° °

## 2015-08-07 NOTE — Transfer of Care (Signed)
Immediate Anesthesia Transfer of Care Note  Patient: Jamie Daugherty  Procedure(s) Performed: Procedure(s): OPEN REDUCTION INTERNAL FIXATION (ORIF) LEFT  PERIPROSTHETIC FEMUR FRACTURE (Left)  Patient Location: PACU  Anesthesia Type:General  Level of Consciousness: awake, alert  and patient cooperative  Airway & Oxygen Therapy: Patient Spontanous Breathing and Patient connected to nasal cannula oxygen  Post-op Assessment: Report given to RN and Post -op Vital signs reviewed and stable  Post vital signs: Reviewed and stable  Last Vitals:  Filed Vitals:   08/07/15 1447  BP: 159/73  Pulse: 75  Resp: 18    Last Pain: There were no vitals filed for this visit.       Complications: No apparent anesthesia complications

## 2015-08-07 NOTE — Op Note (Signed)
   Date of Surgery: 08/07/2015  INDICATIONS: Ms. Wipf is a 82 y.o.-year-old female with a left a prosthetic femur fracture;  The patient did consent to the procedure after discussion of the risks and benefits.  PREOPERATIVE DIAGNOSIS: Left periprosthetic femur fracture with stable femoral component  POSTOPERATIVE DIAGNOSIS: Same.  PROCEDURE: Open reduction internal fixation of left periprosthetic femur fracture with cable plate  SURGEON: N. Eduard Roux, M.D.  ASSIST: Ky Barban, RNFA.  ANESTHESIA:  general  IV FLUIDS AND URINE: See anesthesia.  ESTIMATED BLOOD LOSS: 50 mL.  IMPLANTS: [employer] trochanteric cable plate 11 hole  DRAINS: None  COMPLICATIONS: None.  DESCRIPTION OF PROCEDURE: The patient was brought to the operating room and placed supine on the operating table.  The patient had been signed prior to the procedure and this was documented. The patient had the anesthesia placed by the anesthesiologist.  A time-out was performed to confirm that this was the correct patient, site, side and location. The patient did receive antibiotics prior to the incision and was re-dosed during the procedure as needed at indicated intervals.  The patient had the operative extremity prepped and draped in the standard surgical fashion.    She was placed in the right lateral decubitus position. A lateral incision over the femur was created. Blunt dissection was carried down to the IT band. The IT band was sharply incised in line with the incision. A sub-vastus approach was performed. The lateral aspect of the femur was exposed. There was a long longitudinal split down the lateral aspect of the femur essentially from the vastus tubercle down to the tip of the femoral stem. The fracture was not grossly unstable. The femoral component was not unstable either. We then found the appropriate length of the cable plate. This was confirmed under fluoroscopy. The cable plate was placed at the  appropriate position. We then placed 5 cables around the femur and sequentially tightened to around 90 pounds of pressure. Fluoroscopy was used to confirm appropriate placement of the cables. The wound was then thoroughly irrigated. The IT band was closed with #1 Vicryl. Subcutaneous layer was closed with 2-0 Vicryl. Skin was closed with 3-0 nylon. Sterile dressings were applied. Patient tolerated the procedure well and no immediate convocation.  POSTOPERATIVE PLAN: Patient will be weight bear as tolerated to the left lower extremity. She will be admitted to the hospitalist.  The patient will require a skilled nursing facility post hospital discharge.  Azucena Cecil, MD [place] 5:45 PM

## 2015-08-07 NOTE — H&P (Addendum)
History and Physical  Jamie Daugherty DOA: 08/07/2015  Referring physician: Erlinda Hong PCP: Stephens Shire, MD  Outpatient Specialists: Erlinda Hong - orthopedics  Chief Complaint: Fall  HPI: Jamie Daugherty is a 82 y.o. female Jamie Daugherty is a 82 y.o. female with a history of Paroxsymal Atrial Fibrillation, Systolic CHF, HTN, recurrent syncope and high fall risk who presented to Dr. Phoebe Sharps office with new hip fracture.   The patient presented with a left periprosthetic femur fracture and was taken to OR on 08/07/15 for open reduction internal fixation of left periprosthetic femur fracture with cable plate.  The procedure was performed by her orthopedist Dr. Erlinda Hong who will be following her in consultation.  The patient will be weight bear as tolerated to the left lower extremity. She will be admitted to the hospitalist service for management. The patient will require a skilled nursing facility post hospital discharge.    Review of Systems:  Constitutional: No Weight Loss, No Weight Gain, Night Sweats, Fevers, Chills, Dizziness, Light Headedness, Fatigue, or Generalized Weakness HEENT: No Headaches, Difficulty Swallowing,Tooth/Dental Problems,Sore Throat,  No Sneezing, Rhinitis, Ear Ache, Nasal Congestion, or Post Nasal Drip,  Cardio-vascular: No Chest pain, Orthopnea, PND, Edema in Lower Extremities, Anasarca, Dizziness, Palpitations  Resp: No Dyspnea, No DOE, No Productive Cough, No Non-Productive Cough, No Hemoptysis, No Wheezing.  GI: No Heartburn, Indigestion, Abdominal Pain, Nausea, Vomiting, Diarrhea, Constipation, Hematemesis, Hematochezia, Melena, Change in Bowel Habits, Loss of Appetite  GU: No Dysuria, No Change in Color of Urine, No Urgency or Urinary Frequency, No Flank pain.  Musculoskeletal: +Left Hip Pain, with Decreased Range of Motion, No Back Pain.  Neurologic: No Syncope, No Seizures, Muscle Weakness, Paresthesia, Vision Disturbance or Loss, No  Diplopia, No Vertigo, +Difficulty Walking,  Skin: No Rash or Lesions. Psych: No Change in Mood or Affect, No Depression or Anxiety, No Memory loss, No Confusion, or Hallucinations  Past Medical History  Diagnosis Date  . Hypertension   . Atrial fibrillation ([place])   . Syncope   . Pacemaker - dual chamber [facility] implanted 2013 01/25/2013  . Hypothyroidism   . Arthritis   . Anemia   . History of blood transfusion    Past Surgical History  Procedure Laterality Date  . Pacemaker insertion    . Abdominal hysterectomy    . Anterior approach hemi hip arthroplasty Left 04/17/2015    Procedure: ANTERIOR APPROACH LEFT HEMI HIP ARTHROPLASTY;  Surgeon: Leandrew Koyanagi, MD;  Location: [facility];  Service: Orthopedics;  Laterality: Left;   Social History:  reports that she has quit smoking. She has never used smokeless tobacco. She reports that she drinks alcohol. She reports that she does not use illicit drugs.   Allergies  Allergen Reactions  . Penicillins Other (See Comments)    Unknown. Tolerates cephalexin.    Family History  Problem Relation Age of Onset  . Heart disease Mother   . Heart disease Father    Prior to Admission medications   Medication Sig Start Date End Date Taking? Authorizing Provider  amiodarone (PACERONE) 200 MG tablet Take 1 tablet (200 mg total) by mouth daily. 10/21/14  Yes Mihai Croitoru, MD  atenolol (TENORMIN) 25 MG tablet Take 1 tablet (25 mg total) by mouth daily. 05/13/15  Yes Mihai Croitoru, MD  levothyroxine (SYNTHROID, LEVOTHROID) 75 MCG tablet Take 1 tablet (75 mcg total) by mouth daily before breakfast. 02/18/15  Yes Mihai Croitoru, MD  oxyCODONE-acetaminophen (PERCOCET) 5-325 MG tablet Take 1-2 tablets by  mouth every 4 (four) hours as needed for severe pain. 04/17/15  Yes Naiping Ephriam Jenkins, MD  polyethylene glycol (MIRALAX / GLYCOLAX) packet Take 17 g by mouth daily. 04/23/15  Yes Annita Brod, MD  enoxaparin (LOVENOX) 40 MG/0.4ML injection Inject 0.4 mLs  (40 mg total) into the skin daily. 04/17/15   Naiping Ephriam Jenkins, MD  enoxaparin (LOVENOX) 40 MG/0.4ML injection Inject 0.4 mLs (40 mg total) into the skin daily. 08/07/15   Naiping Ephriam Jenkins, MD  HYDROcodone-acetaminophen (NORCO) 7.5-325 MG tablet Take 1-2 tablets by mouth every 6 (six) hours as needed for moderate pain. 08/07/15   Leandrew Koyanagi, MD   Physical Exam: Filed Vitals:   08/07/15 1447  BP: 159/73  Pulse: 75  Resp: 18  SpO2: 97%    PSYCH: She is alert; does not appear anxious does not appear depressed; affect is normal HEENT: Normocephalic and Atraumatic, Mucous membranes pink; PERRLA; EOM intact; Fundi: Benign; No scleral icterus, Nares: Patent, Oropharynx: Clear, Fair Dentition,  Neck: FROM, No Cervical Lymphadenopathy nor Thyromegaly or Carotid Bruit; No JVD; CHEST WALL: No tenderness CHEST: Normal respiration, clear to auscultation bilaterally HEART: Regular rate and rhythm; no murmurs rubs or gallops BACK: No kyphosis or scoliosis; No CVA tenderness ABDOMEN: Positive Bowel Sounds, Scaphoid, Soft Non-Tender, No Rebound or Guarding; No Masses, No Organomegaly EXTREMITIES: wounds C/D/I PULSES: 2+ and symmetric SKIN: Normal hydration no rash or ulceration CNS: No Focal Deficits Vascular: pulses palpable throughout   Labs on Admission:  Results for orders placed or performed during the hospital encounter of 08/02/15  CBC with Differential  Result Value Ref Range   WBC 9.8 4.0 - 10.5 K/uL   RBC 4.31 3.87 - 5.11 MIL/uL   Hemoglobin 11.9 (L) 12.0 - 15.0 g/dL   HCT 37.4 36.0 - 46.0 %   MCV 86.8 78.0 - 100.0 fL   MCH 27.6 26.0 - 34.0 pg   MCHC 31.8 30.0 - 36.0 g/dL   RDW 14.5 11.5 - 15.5 %   Platelets 228 150 - 400 K/uL   Neutrophils Relative % 81 %   Neutro Abs 8.0 (H) 1.7 - 7.7 K/uL   Lymphocytes Relative 12 %   Lymphs Abs 1.1 0.7 - 4.0 K/uL   Monocytes Relative 6 %   Monocytes Absolute 0.5 0.1 - 1.0 K/uL   Eosinophils Relative 1 %   Eosinophils Absolute 0.1 0.0 - 0.7  K/uL   Basophils Relative 0 %   Basophils Absolute 0.0 0.0 - 0.1 K/uL  Comprehensive metabolic panel  Result Value Ref Range   Sodium 132 (L) 135 - 145 mmol/L   Potassium 3.9 3.5 - 5.1 mmol/L   Chloride 99 (L) 101 - 111 mmol/L   CO2 27 22 - 32 mmol/L   Glucose, Bld 111 (H) 65 - 99 mg/dL   BUN 10 6 - 20 mg/dL   Creatinine, Ser 0.79 0.44 - 1.00 mg/dL   Calcium 8.3 (L) 8.9 - 10.3 mg/dL   Total Protein 6.6 6.5 - 8.1 g/dL   Albumin 3.1 (L) 3.5 - 5.0 g/dL   AST 21 15 - 41 U/L   ALT 15 14 - 54 U/L   Alkaline Phosphatase 84 38 - 126 U/L   Total Bilirubin 0.4 0.3 - 1.2 mg/dL   GFR calc non Af Amer >60 >60 mL/min   GFR calc Af Amer >60 >60 mL/min   Anion gap 6 5 - 15  Urinalysis, Routine w reflex microscopic (not at [facility])  Result Value Ref  Range   Color, Urine YELLOW YELLOW   APPearance CLEAR CLEAR   Specific Gravity, Urine 1.011 1.005 - 1.030   pH 8.0 5.0 - 8.0   Glucose, UA NEGATIVE NEGATIVE mg/dL   Hgb urine dipstick NEGATIVE NEGATIVE   Bilirubin Urine NEGATIVE NEGATIVE   Ketones, ur NEGATIVE NEGATIVE mg/dL   Protein, ur NEGATIVE NEGATIVE mg/dL   Nitrite NEGATIVE NEGATIVE   Leukocytes, UA NEGATIVE NEGATIVE  CK  Result Value Ref Range   Total CK 70 38 - 234 U/L     Basic Metabolic Panel:  Recent Labs Lab 08/02/15 1023  NA 132*  K 3.9  CL 99*  CO2 27  GLUCOSE 111*  BUN 10  CREATININE 0.79  CALCIUM 8.3*   Liver Function Tests:  Recent Labs Lab 08/02/15 1023  AST 21  ALT 15  ALKPHOS 84  BILITOT 0.4  PROT 6.6  ALBUMIN 3.1*   No results for input(s): LIPASE, AMYLASE in the last 168 hours. No results for input(s): AMMONIA in the last 168 hours. CBC:  Recent Labs Lab 08/02/15 1023  WBC 9.8  NEUTROABS 8.0*  HGB 11.9*  HCT 37.4  MCV 86.8  PLT 228   Cardiac Enzymes:  Recent Labs Lab 08/02/15 1023  CKTOTAL 70    BNP (last 3 results) No results for input(s): PROBNP in the last 8760 hours. CBG: No results for input(s): GLUCAP in the last 168  hours.  Radiological Exams on Admission: Dg C-arm 1-60 Min  08/07/2015  CLINICAL DATA:  ORIF left femur EXAM: DG C-ARM 61-120 MIN; LEFT FEMUR 2 VIEWS COMPARISON:  None FLUOROSCOPY TIME:  32 seconds FINDINGS: Existing left total hip arthroplasty. Interval placement of a lateral proximal femoral diaphysis sideplate with multiple cerclage wires transfixing a nondisplaced fracture. Anatomic alignment. No other fracture. No dislocation. IMPRESSION: Interval ORIF left proximal femur fracture. Electronically Signed   By: Kathreen Devoid   On: 08/07/2015 17:37   Dg Femur Min 2 Views Left  08/07/2015  CLINICAL DATA:  ORIF left femur EXAM: DG C-ARM 61-120 MIN; LEFT FEMUR 2 VIEWS COMPARISON:  None FLUOROSCOPY TIME:  32 seconds FINDINGS: Existing left total hip arthroplasty. Interval placement of a lateral proximal femoral diaphysis sideplate with multiple cerclage wires transfixing a nondisplaced fracture. Anatomic alignment. No other fracture. No dislocation. IMPRESSION: Interval ORIF left proximal femur fracture. Electronically Signed   By: Kathreen Devoid   On: 08/07/2015 17:37   Assessment/Plan Active Problems:   PAF (paroxysmal atrial fibrillation) ([place])   Pacemaker - dual chamber [employer] implanted 2013   Syncope   Dementia without behavioral disturbance   Protein-calorie malnutrition, severe ([place])   Anemia due to blood loss, acute   Chronic systolic heart failure ([place])   Hip fracture ([place])   Hypertension   Fall  1. Post-op s/p Open reduction internal fixation of left periprosthetic femur fracture with cable plate - Pt is doing well post-op and tolerated procedure well.  Postop management per orthopedics, lovenox ordered for DVT prophylaxis.  Pt can weight bear as tolerated per orthopedics Erlinda Hong).  Get PT/OT evaluation.  Consult Education officer, museum for SNF placement.   2. PAF - rate is controlled, pt is not anticoagulated due to HIGH Fall Risk.  She had been taken off Xarelto earlier in the year.  She  is on lovenox now for DVT prophylaxis.  3. Chronic systolic heart failure - currently well compensated, will monitor.  Clinically euvolemic. EF from echocardiogram in November 2016 at 30-35 percent. 4. Chronic Anemia -  will follow post-op CBC.  Transfuse as needed. 5. Hypertension - plan to resume home blood pressure medications.  BMP pending, will follow.   6. History of Dementia - will need to monitor closely.  Fall precautions.  Currently at baseline, monitor for high risk of hospital delirium.  7. Hypothyroidism - resume home thyroid replacement.     DVT Prophylaxis: Lovenox Code Status: Full     Disposition Plan: SNF    Irwin Brakeman, MD [facility] Pager [phone]  If 7PM-7AM, please contact night-coverage [email or web address] Password [facility] 08/07/2015, 5:51 PM

## 2015-08-08 ENCOUNTER — Encounter (HOSPITAL_COMMUNITY): Payer: Self-pay | Admitting: Orthopaedic Surgery

## 2015-08-08 DIAGNOSIS — I480 Paroxysmal atrial fibrillation: Secondary | ICD-10-CM

## 2015-08-08 DIAGNOSIS — I5022 Chronic systolic (congestive) heart failure: Secondary | ICD-10-CM

## 2015-08-08 DIAGNOSIS — W19XXXD Unspecified fall, subsequent encounter: Secondary | ICD-10-CM

## 2015-08-08 DIAGNOSIS — I10 Essential (primary) hypertension: Secondary | ICD-10-CM

## 2015-08-08 DIAGNOSIS — F0390 Unspecified dementia without behavioral disturbance: Secondary | ICD-10-CM

## 2015-08-08 LAB — CBC
HCT: 31.5 % — ABNORMAL LOW (ref 36.0–46.0)
HEMOGLOBIN: 9.8 g/dL — AB (ref 12.0–15.0)
MCH: 27.1 pg (ref 26.0–34.0)
MCHC: 31.1 g/dL (ref 30.0–36.0)
MCV: 87 fL (ref 78.0–100.0)
Platelets: 227 10*3/uL (ref 150–400)
RBC: 3.62 MIL/uL — AB (ref 3.87–5.11)
RDW: 14.6 % (ref 11.5–15.5)
WBC: 9.8 10*3/uL (ref 4.0–10.5)

## 2015-08-08 MED ORDER — ALPRAZOLAM 0.25 MG PO TABS
0.2500 mg | ORAL_TABLET | Freq: Every day | ORAL | Status: DC | PRN
  Administered 2015-08-08: 0.25 mg via ORAL
  Filled 2015-08-08: qty 1

## 2015-08-08 NOTE — Care Management (Signed)
Care manager spoke with patient and her son, Viona Ledvina concerning discharge plan. Mr. Legleiter states his mom was at [facility] in March and they would like for her to return there, if a bed not available they want [facility]. Mr. Marcello number is [phone]. Case manager assured him that information will be provided to Education officer, museum. Mr. Delwiche states that he will be out of town for a week, but his mom is capable of signing her own paper work.

## 2015-08-08 NOTE — Care Management Important Message (Signed)
Important Message  Patient Details  Name: Jamie Daugherty MRN: [id number] Date of Birth: 07-04-1918   Medicare Important Message Given:  Yes    Loann Quill 08/08/2015, 9:55 AM

## 2015-08-08 NOTE — NC FL2 (Signed)
[employer] LEVEL OF CARE SCREENING TOOL     IDENTIFICATION  Patient Name: Jamie Daugherty Birthdate: 25-May-1918 Sex: female Admission Date (Current Location): 08/07/2015  [facility] and [place] Number:  Herbalist and Address:  [facility] [place], [place] [address] [place], [place] [address]      Provider Number: [id number]  Attending Physician Name and Address:  Hosie Poisson, MD  Relative Name and Phone Number:       Current Level of Care: Hospital Recommended Level of Care: [place] Prior Approval Number:    Date Approved/Denied:   PASRR Number:    Discharge Plan: SNF    Current Diagnoses: Patient Active Problem List   Diagnosis Date Noted  . Hip fracture requiring operative repair ([place]) 08/07/2015  . Hip joint replacement status   . Dementia   . Hypoxia   . Orthostasis   . Hyponatremia   . Status post left hip replacement   . Hip fracture ([place]) 04/16/2015  . Hypertension 04/16/2015  . Fall 04/16/2015  . Chronic systolic heart failure ([place]) 12/25/2014  . Anemia 10/20/2014  . Anemia due to blood loss, acute 10/19/2014  . Protein-calorie malnutrition, severe ([place]) 10/16/2014  . Posterior epistaxis 10/15/2014  . Syncope 10/15/2014  . Dementia without behavioral disturbance 10/15/2014  . Laceration of right upper arm 10/15/2014  . Epistaxis   . Persistent atrial fibrillation ([place]) 09/17/2014  . Pacemaker - dual chamber [facility] implanted 2013 01/25/2013  . PAF (paroxysmal atrial fibrillation) ([place]) 11/06/2012    Orientation RESPIRATION BLADDER Height & Weight     Self  Normal Continent Weight:   Height:     BEHAVIORAL SYMPTOMS/MOOD NEUROLOGICAL BOWEL NUTRITION STATUS      Continent Diet (Please see discharge summary.)  AMBULATORY STATUS COMMUNICATION OF NEEDS Skin    (Patient has not ambulated with PT) Verbally Surgical wounds                       Personal Care Assistance Level of  Assistance  Bathing, Feeding, Dressing Bathing Assistance: Limited assistance Feeding assistance: Independent Dressing Assistance: Limited assistance     Functional Limitations Info             [place]  PT (By licensed PT), OT (By licensed OT)     PT Frequency: 5 OT Frequency: 5            Contractures      Additional Factors Info  Code Status, Allergies Code Status Info: FULL Allergies Info: Penicillins           Current Medications (08/08/2015):  This is the current hospital active medication list Current Facility-Administered Medications  Medication Dose Route Frequency Provider Last Rate Last Dose  . 0.9 %  sodium chloride infusion   Intravenous Continuous Naiping Ephriam Jenkins, MD      . 0.9 %  sodium chloride infusion  250 mL Intravenous PRN Clanford Marisa Hua, MD      . acetaminophen (TYLENOL) tablet 650 mg  650 mg Oral Q6H PRN Leandrew Koyanagi, MD   650 mg at 08/08/15 1200   Or  . acetaminophen (TYLENOL) suppository 650 mg  650 mg Rectal Q6H PRN Leandrew Koyanagi, MD      . alum & mag hydroxide-simeth (MAALOX/MYLANTA) 200-200-20 MG/5ML suspension 30 mL  30 mL Oral Q4H PRN Naiping Ephriam Jenkins, MD      . amiodarone (PACERONE) tablet 200 mg  200 mg Oral Daily Leandrew Koyanagi, MD   200 mg at 08/08/15 1109  . atenolol (TENORMIN) tablet 25 mg  25 mg Oral Daily Leandrew Koyanagi, MD   25 mg at 08/08/15 1109  . bisacodyl (DULCOLAX) suppository 10 mg  10 mg Rectal Daily PRN Clanford Marisa Hua, MD      . docusate sodium (COLACE) capsule 100 mg  100 mg Oral BID Clanford Marisa Hua, MD   100 mg at 08/08/15 1109  . enoxaparin (LOVENOX) injection 30 mg  30 mg Subcutaneous Q24H Clanford L Johnson, MD   30 mg at 08/08/15 1108  . hydrALAZINE (APRESOLINE) injection 5 mg  5 mg Intravenous Q4H PRN Clanford L Johnson, MD      . HYDROcodone-acetaminophen (NORCO/VICODIN) 5-325 MG per tablet 1-2 tablet  1-2 tablet Oral Q6H PRN Naiping Ephriam Jenkins, MD      . lactated ringers infusion   Intravenous  Continuous Naiping Ephriam Jenkins, MD      . levothyroxine (SYNTHROID, LEVOTHROID) tablet 75 mcg  75 mcg Oral QAC breakfast Leandrew Koyanagi, MD   75 mcg at 08/08/15 1109  . menthol-cetylpyridinium (CEPACOL) lozenge 3 mg  1 lozenge Oral PRN Naiping Ephriam Jenkins, MD       Or  . phenol (CHLORASEPTIC) mouth spray 1 spray  1 spray Mouth/Throat PRN Naiping Ephriam Jenkins, MD      . methocarbamol (ROBAXIN) tablet 500 mg  500 mg Oral Q6H PRN Naiping Ephriam Jenkins, MD       Or  . methocarbamol (ROBAXIN) 500 mg in dextrose 5 % 50 mL IVPB  500 mg Intravenous Q6H PRN Naiping Ephriam Jenkins, MD      . metoCLOPramide (REGLAN) tablet 5-10 mg  5-10 mg Oral Q8H PRN Naiping Ephriam Jenkins, MD       Or  . metoCLOPramide (REGLAN) injection 5-10 mg  5-10 mg Intravenous Q8H PRN Naiping Ephriam Jenkins, MD      . morphine 2 MG/ML injection 0.5 mg  0.5 mg Intravenous Q2H PRN Naiping Ephriam Jenkins, MD      . ondansetron [facility]) tablet 4 mg  4 mg Oral Q6H PRN Naiping Ephriam Jenkins, MD       Or  . ondansetron [facility]) injection 4 mg  4 mg Intravenous Q6H PRN Leandrew Koyanagi, MD   4 mg at 08/07/15 2345  . oxyCODONE (Oxy IR/ROXICODONE) immediate release tablet 5-10 mg  5-10 mg Oral Q4H PRN Leandrew Koyanagi, MD   10 mg at 08/07/15 1826  . oxyCODONE-acetaminophen (PERCOCET/ROXICET) 5-325 MG per tablet 1-2 tablet  1-2 tablet Oral Q4H PRN Leandrew Koyanagi, MD      . pantoprazole (PROTONIX) EC tablet 40 mg  40 mg Oral Q0600 Clanford Marisa Hua, MD   40 mg at 08/08/15 0524  . polyethylene glycol (MIRALAX / GLYCOLAX) packet 17 g  17 g Oral Daily Naiping Ephriam Jenkins, MD   17 g at 08/08/15 1109  . sodium chloride flush (NS) 0.9 % injection 3 mL  3 mL Intravenous Q12H Clanford L Johnson, MD      . sodium chloride flush (NS) 0.9 % injection 3 mL  3 mL Intravenous PRN Clanford Marisa Hua, MD         Discharge Medications: Please see discharge summary for a list of discharge medications.  Relevant Imaging Results:  Relevant Lab Results:   Additional Information SSN: [id number]  Caroline Sauger, [place]

## 2015-08-08 NOTE — Evaluation (Signed)
Physical Therapy Evaluation Patient Details Name: Jamie Daugherty MRN: [id number] DOB: Jul 14, 1918 Today's Date: 08/08/2015   History of Present Illness  82 y.o. female with periprosthetic femur fracture following a fall at home (believed related to syncope). Pt now s/p open reduction internal fixation of left periprosthetic femur fracture with cable plate.PMH: hypertension, atrial fibrillation, syncope, pacemaker, anterior THA.   Clinical Impression  Pt needing mod assist with LEs for bed mobility at this time. Pt able to sit EOB X2 but unable to attempt standing due to reports of feeling "woozy" and dizzy. BP 120/46 supine and 98/35 sitting. PT to continue to follow and progress mobility as tolerated. Recommending SNF at D/C for further rehabilitation.     Follow Up Recommendations SNF;Supervision/Assistance - 24 hour    Equipment Recommendations  None recommended by PT (to be addressed at next venue)    Recommendations for Other Services       Precautions / Restrictions Precautions Precautions: Fall Precaution Comments: orthostatic, pacemaker, history of syncope Restrictions Weight Bearing Restrictions: Yes LLE Weight Bearing: Weight bearing as tolerated      Mobility  Bed Mobility Overal bed mobility: Needs Assistance Bed Mobility: Supine to Sit;Sit to Supine     Supine to sit: Mod assist (with LEs ) Sit to supine: Mod assist (with LEs)   General bed mobility comments: Pt able to assist with UEs and rail to come to sitting. Performed supine<>sit X2 with tolerance limited by pt reports of dizziness and request to return to supine. BP taken supine 120/46, sitting 98/35.   Transfers                 General transfer comment: unable to safely attempt  Ambulation/Gait                Stairs            Wheelchair Mobility    Modified Rankin (Stroke Patients Only)       Balance Overall balance assessment: Needs assistance Sitting-balance support:  Single extremity supported Sitting balance-Leahy Scale: Poor                                       Pertinent Vitals/Pain Pain Assessment: Faces (no pain at rest, increasing with activity) Faces Pain Scale: Hurts even more Pain Location: Lt hip/thigh Pain Descriptors / Indicators: Grimacing;Guarding Pain Intervention(s): Limited activity within patient's tolerance;Monitored during session    Home Living Family/patient expects to be discharged to:: Skilled nursing facility                      Prior Function Level of Independence: Needs assistance   Gait / Transfers Assistance Needed: using rollator for ambulation  ADL's / Homemaking Assistance Needed: supervision with showering        Hand Dominance        Extremity/Trunk Assessment   Upper Extremity Assessment: Generalized weakness           Lower Extremity Assessment: LLE deficits/detail   LLE Deficits / Details: assist needed to move LLE with bed mobility     Communication   Communication: No difficulties  Cognition Arousal/Alertness: Awake/alert Behavior During Therapy: WFL for tasks assessed/performed Overall Cognitive Status: History of cognitive impairments - at baseline                      General Comments General comments (skin integrity,  edema, etc.): BP supine 120/46, sitting 98/35, SpO2 96%.     Exercises        Assessment/Plan    PT Assessment Patient needs continued PT services  PT Diagnosis Difficulty walking   PT Problem List Decreased strength;Decreased range of motion;Decreased activity tolerance;Decreased balance;Decreased mobility  PT Treatment Interventions DME instruction;Gait training;Functional mobility training;Therapeutic activities;Therapeutic exercise;Patient/family education   PT Goals (Current goals can be found in the Care Plan section) Acute Rehab PT Goals Patient Stated Goal: be able to get up and moving again PT Goal Formulation: With  patient Time For Goal Achievement: 08/22/15 Potential to Achieve Goals: Good    Frequency Min 3X/week   Barriers to discharge        Co-evaluation               End of Session   Activity Tolerance: Other (comment) (limited by dizziness and dropping BP. ) Patient left: in bed;with call bell/phone within reach;with bed alarm set;with family/visitor present;with SCD's reapplied Nurse Communication: Mobility status;Weight bearing status         Time: [id number] PT Time Calculation (min) (ACUTE ONLY): 41 min   Charges:   PT Evaluation $PT Eval Moderate Complexity: 1 Procedure PT Treatments $Therapeutic Activity: 23-37 mins   PT G Codes:        Cassell Clement, PT, CSCS Pager [phone] Office [phone]  08/08/2015, 1:38 PM

## 2015-08-08 NOTE — Progress Notes (Signed)
OT Cancellation Note  Patient Details Name: Jamie Daugherty MRN: [id number] DOB: 21-Jan-1919   Cancelled Treatment:    Reason Eval/Treat Not Completed: Other (comment) Pt is Medicare and current D/C plan is SNF. No apparent immediate acute care OT needs, therefore will defer OT to SNF. If OT eval is needed please call Acute Rehab Dept. at [phone] or text page OT at [phone].  [place], OTR/L  [phone] 08/08/2015 08/08/2015, 7:52 AM

## 2015-08-08 NOTE — Progress Notes (Signed)
   Subjective:  Patient reports pain as mild.  Tearful this morning given situation.  Objective:   VITALS:   Filed Vitals:   08/07/15 2000 08/07/15 2017 08/07/15 2348 08/08/15 0431  BP: 66/34 91/40 136/53 147/63  Pulse: 70 74 73 77  Temp: 97.5 F (36.4 C)  97.5 F (36.4 C) 97.7 F (36.5 C)  TempSrc: Axillary  Axillary Oral  Resp: 12  14 15   SpO2: 100% 100% 100% 100%    Neurologically intact Neurovascular intact Sensation intact distally Intact pulses distally Dorsiflexion/Plantar flexion intact Incision: dressing C/D/I and no drainage No cellulitis present Compartment soft   Lab Results  Component Value Date   WBC 9.8 08/08/2015   HGB 9.8* 08/08/2015   HCT 31.5* 08/08/2015   MCV 87.0 08/08/2015   PLT 227 08/08/2015     Assessment/Plan:  1 Day Post-Op   - Expected postop acute blood loss anemia - will monitor for symptoms - Up with PT/OT - DVT ppx - SCDs, ambulation, lovenox - WBAT operative extremity - Pain control - Discharge planning - will need SNF  Marianna Payment 08/08/2015, 9:23 AM [phone]

## 2015-08-08 NOTE — Progress Notes (Signed)
Initial Nutrition Assessment  DOCUMENTATION CODES:   Severe malnutrition in context of chronic illness, Underweight  INTERVENTION:  Provide Magic cup BID between meals, each supplement provides 290 kcal and 9 grams of protein.  Encourage adequate PO intake.   NUTRITION DIAGNOSIS:   Malnutrition related to chronic illness as evidenced by severe depletion of body fat, severe depletion of muscle mass.  GOAL:   Patient will meet greater than or equal to 90% of their needs  MONITOR:   PO intake, Supplement acceptance, Weight trends, Labs, I & O's  REASON FOR ASSESSMENT:   Consult Assessment of nutrition requirement/status  ASSESSMENT:   82 y.o. female with a history of Paroxsymal Atrial Fibrillation, Systolic CHF, HTN, recurrent syncope and high fall risk who presented to Dr. Phoebe Sharps office with new hip fracture. The patient presented with a left periprosthetic femur fracture and was taken to OR on 08/07/15 for open reduction internal fixation of left periprosthetic femur fracture with cable plate.  Meal completion has been 50%. Pt reports appetite is fine currently and PTA with usual consumption of at least 3 meals daily. Daughter at bedside. Per Epic weight records, pt with no significant weight loss. Pt reports she does not consume any Ensure/Boost as she reports they are "too sweet" for her. Pt however does report liking ice cream. RD to order Magic cup. Daughter requested information on high protein needs. Examples of high protein foods were discussed.   Nutrition-Focused physical exam completed. Findings are severe fat depletion, severe muscle depletion, and mild edema.   Labs and medications reviewed.   Diet Order:  Diet regular Room service appropriate?: Yes; Fluid consistency:: Thin  Skin:   (Incision on L hip)  Last BM:  Unknown  Height:   Ht Readings from Last 1 Encounters:  08/02/15 5\' 7"  (1.702 m)    Weight:   Wt Readings from Last 1 Encounters:  08/02/15 98  lb (44.453 kg)    Ideal Body Weight:  61.36 kg  BMI:  Body Mass Index: 15.39 kg/(m^2)  Estimated Nutritional Needs:   Kcal:  1400-1600  Protein:  55-70 grams  Fluid:  >/= 1.5 L/day  EDUCATION NEEDS:   Education needs addressed  Corrin Parker, MS, RD, LDN Pager # [phone] After hours/ weekend pager # [phone]

## 2015-08-08 NOTE — Clinical Social Work Note (Signed)
Clinical Social Work Assessment  Patient Details  Name: Jamie Daugherty MRN: [id number] Date of Birth: 1918/10/13  Date of referral:  08/08/15               Reason for consult:  Facility Placement, Discharge Planning                Permission sought to share information with:  Facility Sport and exercise psychologist, Family Supports Permission granted to share information::  Yes, Verbal Permission Granted  Name::     Jamie Daugherty  Agency::  [facility]  Relationship::  Son  Sport and exercise psychologist Information:  [phone]  Housing/Transportation Living arrangements for the past 2 months:  [place] of Information:  Patient Patient Interpreter Needed:  None Criminal Activity/Legal Involvement Pertinent to Current Situation/Hospitalization:  No - Comment as needed Significant Relationships:  Adult Children Lives with:  Self Do you feel safe going back to the place where you live?  No Need for family participation in patient care:  Yes (Comment) (Patient's son actively involved in patient's care.)  Care giving concerns:  Patient's son expressed no concerns at this time.   Social Worker assessment / plan:  LCSW received referral for possible SNF placement at time of discharge. LCSW spoke with patient's son regarding discharge. Per patient's son, patient to be discharged to [facility] SNF once stable. LCSW to continue to follow and assist with patient's discharge to [facility] once medically stable.  Employment status:  Retired Forensic scientist:  Medicare PT Recommendations:  [place] / Referral to community resources:  [place]  Patient/Family's Response to care:  Patient's son understanding and agreeable to [facility] plan of care.  Patient/Family's Understanding of and Emotional Response to Diagnosis, Current Treatment, and Prognosis:  Patient's son understanding and agreeable to LCSW plan of care.  Emotional Assessment Appearance:   Appears stated age Attitude/Demeanor/Rapport:  Other (Patient oriented to self.) Affect (typically observed):   (Patient oriented to self.) Orientation:  Oriented to Self Alcohol / Substance use:  Not Applicable Psych involvement (Current and /or in the community):  No (Comment) (Not appropriate on this admission.)  Discharge Needs  Concerns to be addressed:  No discharge needs identified Readmission within the last 30 days:  No Current discharge risk:  None Barriers to Discharge:  No Barriers Identified   Caroline Sauger, LCSW 08/08/2015, 3:05 PM [phone]

## 2015-08-08 NOTE — Clinical Social Work Placement (Signed)
   CLINICAL SOCIAL WORK PLACEMENT  NOTE  Date:  08/08/2015  Patient Details  Name: Jamie Daugherty MRN: [id number] Date of Birth: 26-Oct-1918  Clinical Social Work is seeking post-discharge placement for this patient at the [place] level of care (*CSW will initial, date and re-position this form in  chart as items are completed):  Yes   Patient/family provided with [place] Work Department's list of facilities offering this level of care within the geographic area requested by the patient (or if unable, by the patient's family).  Yes   Patient/family informed of their freedom to choose among providers that offer the needed level of care, that participate in Medicare, Medicaid or managed care program needed by the patient, have an available bed and are willing to accept the patient.  Yes   Patient/family informed of [facility]'s ownership interest in [facility] and [facility], as well as of the fact that they are under no obligation to receive care at these facilities.  PASRR submitted to EDS on       PASRR number received on       Existing PASRR number confirmed on 08/08/15     FL2 transmitted to all facilities in geographic area requested by pt/family on 08/08/15     FL2 transmitted to all facilities within larger geographic area on       Patient informed that his/her managed care company has contracts with or will negotiate with certain facilities, including the following:        Yes   Patient/family informed of bed offers received.  Patient chooses bed at [facility]     Physician recommends and patient chooses bed at      Patient to be transferred to [facility] on  .  Patient to be transferred to facility by       Patient family notified on   of transfer.  Name of family member notified:        PHYSICIAN Please sign FL2     Additional Comment:    _______________________________________________ Caroline Sauger,  LCSW 08/08/2015, 3:09 PM

## 2015-08-08 NOTE — Clinical Social Work Note (Signed)
PASARR: [id number] Jamie Daugherty, [facility]: [phone] Surgical: [phone]

## 2015-08-08 NOTE — Progress Notes (Signed)
PROGRESS NOTE    Jamie Daugherty  [id number] DOB: [birth year] DOA: 08/07/2015 PCP: Stephens Shire, MD    Brief Narrative: Jamie Daugherty is a 82 y.o.-year-old female with a left a prosthetic femur fracture presents with a recurrent fracture of left proximal femur fracture after a fall. Underwent  Open reduction internal fixation of left periprosthetic femur fracture with cable plate on QA348G, plan for PT eval and SNF PLACEMENT.    Assessment & Plan:   Active Problems:   PAF (paroxysmal atrial fibrillation) ([place])   Pacemaker - dual chamber [employer] implanted 2013   Syncope   Dementia without behavioral disturbance   Protein-calorie malnutrition, severe (HCC)   Anemia due to blood loss, acute   Chronic systolic heart failure ([place])   Hip fracture ([place])   Hypertension   Fall   Hip fracture requiring operative repair [facility])   Left proximal femur fracture:    Open reduction internal fixation of left periprosthetic femur fracture with cable plate on QA348G  Pain control and  Muscle relaxant.   PT eval and plan for REHAB soon.     paroxysmal atrial fibrillation - rate controlled, resume atenolol and amiodarone.    Chronic systolic heart failure: Compensated.    Hypertension: controlled.   H/o dementia : No behavioral abnormalities.   Hypothyroidism: resume synthroid.   DVT prophylaxis: (Lovenox/) Code Status: (Full) Family Communication: none at bedside, does not want me to disturb her family .  Disposition Plan:snf when bed available.    Consultants:   Orthopedics.    Procedures:  6/22 Open reduction internal fixation of left periprosthetic femur fracture with cable plate   Antimicrobials: ancef one dose.    Subjective: Tearful that she has fallen multiple times. Still wants to go home after rehabilitation.    Objective: Filed Vitals:   08/07/15 2017 08/07/15 2348 08/08/15 0431 08/08/15 1225  BP: 91/40 136/53 147/63 108/44  Pulse: 74 73 77 77    Temp:  97.5 F (36.4 C) 97.7 F (36.5 C)   TempSrc:  Axillary Oral   Resp:  14 15 16   SpO2: 100% 100% 100% 95%    Intake/Output Summary (Last 24 hours) at 08/08/15 1654 Last data filed at 08/08/15 1224  Gross per 24 hour  Intake   1060 ml  Output    300 ml  Net    760 ml   There were no vitals filed for this visit.  Examination:  General exam: Appears depressed.  Respiratory system: Clear to auscultation. Respiratory effort normal. Cardiovascular system: S1 & S2 heard, RRR. No JVD, murmurs, rubs, gallops or clicks. No pedal edema. Gastrointestinal system: Abdomen is nondistended, soft and nontender. No organomegaly or masses felt. Normal bowel sounds heard. Central nervous system: Alert and oriented. No focal neurological deficits. Extremities: dressing without any drainage.  Skin: No rashes, lesions or ulcers     Data Reviewed: I have personally reviewed following labs and imaging studies  CBC:  Recent Labs Lab 08/02/15 1023 08/07/15 1850 08/08/15 0526  WBC 9.8 11.2* 9.8  NEUTROABS 8.0*  --   --   HGB 11.9* 10.8* 9.8*  HCT 37.4 33.4* 31.5*  MCV 86.8 88.1 87.0  PLT 228 246 [id number]   Basic Metabolic Panel:  Recent Labs Lab 08/02/15 1023 08/07/15 1850  NA 132* 133*  K 3.9 3.7  CL 99* 98*  CO2 27 26  GLUCOSE 111* 115*  BUN 10 10  CREATININE 0.79 0.81  CALCIUM 8.3* 8.3*   GFR: Estimated Creatinine  Clearance: 29.8 mL/min (by C-G formula based on Cr of 0.81). Liver Function Tests:  Recent Labs Lab 08/02/15 1023  AST 21  ALT 15  ALKPHOS 84  BILITOT 0.4  PROT 6.6  ALBUMIN 3.1*   No results for input(s): LIPASE, AMYLASE in the last 168 hours. No results for input(s): AMMONIA in the last 168 hours. Coagulation Profile: No results for input(s): INR, PROTIME in the last 168 hours. Cardiac Enzymes:  Recent Labs Lab 08/02/15 1023  CKTOTAL 70   BNP (last 3 results) No results for input(s): PROBNP in the last 8760 hours. HbA1C: No results for  input(s): HGBA1C in the last 72 hours. CBG: No results for input(s): GLUCAP in the last 168 hours. Lipid Profile: No results for input(s): CHOL, HDL, LDLCALC, TRIG, CHOLHDL, LDLDIRECT in the last 72 hours. Thyroid Function Tests: No results for input(s): TSH, T4TOTAL, FREET4, T3FREE, THYROIDAB in the last 72 hours. Anemia Panel: No results for input(s): VITAMINB12, FOLATE, FERRITIN, TIBC, IRON, RETICCTPCT in the last 72 hours. Sepsis Labs: No results for input(s): PROCALCITON, LATICACIDVEN in the last 168 hours.  No results found for this or any previous visit (from the past 240 hour(s)).       Radiology Studies: Dg C-arm 1-60 Min  08/07/2015  CLINICAL DATA:  ORIF left femur EXAM: DG C-ARM 61-120 MIN; LEFT FEMUR 2 VIEWS COMPARISON:  None FLUOROSCOPY TIME:  32 seconds FINDINGS: Existing left total hip arthroplasty. Interval placement of a lateral proximal femoral diaphysis sideplate with multiple cerclage wires transfixing a nondisplaced fracture. Anatomic alignment. No other fracture. No dislocation. IMPRESSION: Interval ORIF left proximal femur fracture. Electronically Signed   By: Kathreen Devoid   On: 08/07/2015 17:37   Dg Femur Min 2 Views Left  08/07/2015  CLINICAL DATA:  ORIF left femur EXAM: DG C-ARM 61-120 MIN; LEFT FEMUR 2 VIEWS COMPARISON:  None FLUOROSCOPY TIME:  32 seconds FINDINGS: Existing left total hip arthroplasty. Interval placement of a lateral proximal femoral diaphysis sideplate with multiple cerclage wires transfixing a nondisplaced fracture. Anatomic alignment. No other fracture. No dislocation. IMPRESSION: Interval ORIF left proximal femur fracture. Electronically Signed   By: Kathreen Devoid   On: 08/07/2015 17:37   Dg Femur Port Min 2 Views Left  08/07/2015  CLINICAL DATA:  Status post internal fixation of left femoral fracture about the patient's prosthesis. Initial encounter. EXAM: LEFT FEMUR PORTABLE 2 VIEWS COMPARISON:  Left hip CT performed 08/05/2015 FINDINGS:  There has been interval placement of a plate and cerclage wires along the proximal left femur, transfixing the left femoral fracture in grossly anatomic alignment. The left hip prosthesis is otherwise unremarkable in appearance. Surrounding postoperative soft tissue air is noted. No new fracture is seen. IMPRESSION: Status post internal fixation of proximal left femoral fracture in grossly anatomic alignment, with plate and cerclage wires. Left hip prosthesis is unremarkable in appearance. Electronically Signed   By: Garald Balding M.D.   On: 08/07/2015 19:06        Scheduled Meds: . amiodarone  200 mg Oral Daily  . atenolol  25 mg Oral Daily  . docusate sodium  100 mg Oral BID  . enoxaparin (LOVENOX) injection  30 mg Subcutaneous Q24H  . levothyroxine  75 mcg Oral QAC breakfast  . pantoprazole  40 mg Oral Q0600  . polyethylene glycol  17 g Oral Daily  . sodium chloride flush  3 mL Intravenous Q12H   Continuous Infusions: . sodium chloride    . lactated ringers  LOS: 1 day    Time spent: 25 MINUTES.     Hosie Poisson, MD [facility] Pager [phone]   If 7PM-7AM, please contact night-coverage [email or web address] Password [facility] 08/08/2015, 4:54 PM

## 2015-08-09 DIAGNOSIS — D62 Acute posthemorrhagic anemia: Secondary | ICD-10-CM

## 2015-08-09 LAB — BASIC METABOLIC PANEL
ANION GAP: 6 (ref 5–15)
BUN: 12 mg/dL (ref 6–20)
CALCIUM: 7.4 mg/dL — AB (ref 8.9–10.3)
CO2: 24 mmol/L (ref 22–32)
Chloride: 100 mmol/L — ABNORMAL LOW (ref 101–111)
Creatinine, Ser: 0.73 mg/dL (ref 0.44–1.00)
Glucose, Bld: 103 mg/dL — ABNORMAL HIGH (ref 65–99)
POTASSIUM: 3.4 mmol/L — AB (ref 3.5–5.1)
Sodium: 130 mmol/L — ABNORMAL LOW (ref 135–145)

## 2015-08-09 LAB — CBC
HEMATOCRIT: 26.1 % — AB (ref 36.0–46.0)
Hemoglobin: 8.5 g/dL — ABNORMAL LOW (ref 12.0–15.0)
MCH: 28.1 pg (ref 26.0–34.0)
MCHC: 32.6 g/dL (ref 30.0–36.0)
MCV: 86.4 fL (ref 78.0–100.0)
PLATELETS: 197 10*3/uL (ref 150–400)
RBC: 3.02 MIL/uL — AB (ref 3.87–5.11)
RDW: 14.6 % (ref 11.5–15.5)
WBC: 7.1 10*3/uL (ref 4.0–10.5)

## 2015-08-09 MED ORDER — POTASSIUM CHLORIDE CRYS ER 20 MEQ PO TBCR
40.0000 meq | EXTENDED_RELEASE_TABLET | Freq: Once | ORAL | Status: AC
  Administered 2015-08-09: 40 meq via ORAL
  Filled 2015-08-09: qty 2

## 2015-08-09 NOTE — Progress Notes (Addendum)
PROGRESS NOTE    Jamie Daugherty  [id number] DOB: 10/01/1918 DOA: 08/07/2015 PCP: Stephens Shire, MD    Brief Narrative: Ms. Ciaravino is a 82 y.o.-year-old female with a left a prosthetic femur fracture presents with a recurrent fracture of left proximal femur fracture after a fall. Underwent  Open reduction internal fixation of left periprosthetic femur fracture with cable plate on QA348G, plan for PT eval and SNF PLACEMENT.    Assessment & Plan:   Active Problems:   PAF (paroxysmal atrial fibrillation) ([place])   Pacemaker - dual chamber KeySpan implanted 2013   Syncope   Dementia without behavioral disturbance   Protein-calorie malnutrition, severe (HCC)   Anemia due to blood loss, acute   Chronic systolic heart failure ([place])   Hip fracture ([place])   Hypertension   Fall   Hip fracture requiring operative repair [facility])   Left proximal femur fracture:    Open reduction internal fixation of left periprosthetic femur fracture with cable plate on QA348G  Pain control and  Muscle relaxant.   PT eval and plan for REHAB soon.     paroxysmal atrial fibrillation - rate controlled, resume atenolol and amiodarone.    Chronic systolic heart failure: Compensated.    Hypertension: controlled.   H/o dementia : No behavioral abnormalities.   Hypothyroidism: resume synthroid.   Hypokalemia: Replete as needed.   Anemia: normocytic. Baseline hemoglobin around 10, . Anemia probably from blood loss peri op.  transfuse to keep hemoglobin greater than 7.   Dementia: No behavioural changes.  Hyponatremia:  Chronic , asymptomatic.    DVT prophylaxis: (Lovenox/) Code Status: (Full) Family Communication: care giver at bedside. Disposition Plan:snf when bed available.    Consultants:   Orthopedics.    Procedures:  6/22 Open reduction internal fixation of left periprosthetic femur fracture with cable plate   Antimicrobials: ancef one dose.    Subjective: In  better mood today, eating lunch.  Still wants to go home after rehabilitation.    Objective: Filed Vitals:   08/08/15 1225 08/08/15 1938 08/09/15 0422 08/09/15 0911  BP: 108/44 132/46 142/58 136/52  Pulse: 77 70 72 79  Temp:  98.3 F (36.8 C) 99.1 F (37.3 C) 97.7 F (36.5 C)  TempSrc:  Oral Oral Oral  Resp: 16 16 16 18   SpO2: 95% 95% 90% 95%    Intake/Output Summary (Last 24 hours) at 08/09/15 1400 Last data filed at 08/09/15 0251  Gross per 24 hour  Intake    120 ml  Output    500 ml  Net   -380 ml   There were no vitals filed for this visit.  Examination:  General exam: Appears depressed.  Respiratory system: Clear to auscultation. Respiratory effort normal. Cardiovascular system: S1 & S2 heard, RRR. No JVD, murmurs, rubs, gallops or clicks. No pedal edema. Gastrointestinal system: Abdomen is nondistended, soft and nontender. No organomegaly or masses felt. Normal bowel sounds heard. Central nervous system: Alert and oriented. No focal neurological deficits. Extremities: dressing without any drainage.  Skin: No rashes, lesions or ulcers     Data Reviewed: I have personally reviewed following labs and imaging studies  CBC:  Recent Labs Lab 08/07/15 1850 08/08/15 0526 08/09/15 0411  WBC 11.2* 9.8 7.1  HGB 10.8* 9.8* 8.5*  HCT 33.4* 31.5* 26.1*  MCV 88.1 87.0 86.4  PLT 246 227 [id number]   Basic Metabolic Panel:  Recent Labs Lab 08/07/15 1850 08/09/15 0411  NA 133* 130*  K 3.7 3.4*  CL  98* 100*  CO2 26 24  GLUCOSE 115* 103*  BUN 10 12  CREATININE 0.81 0.73  CALCIUM 8.3* 7.4*   GFR: Estimated Creatinine Clearance: 30.2 mL/min (by C-G formula based on Cr of 0.73). Liver Function Tests: No results for input(s): AST, ALT, ALKPHOS, BILITOT, PROT, ALBUMIN in the last 168 hours. No results for input(s): LIPASE, AMYLASE in the last 168 hours. No results for input(s): AMMONIA in the last 168 hours. Coagulation Profile: No results for input(s): INR, PROTIME  in the last 168 hours. Cardiac Enzymes: No results for input(s): CKTOTAL, CKMB, CKMBINDEX, TROPONINI in the last 168 hours. BNP (last 3 results) No results for input(s): PROBNP in the last 8760 hours. HbA1C: No results for input(s): HGBA1C in the last 72 hours. CBG: No results for input(s): GLUCAP in the last 168 hours. Lipid Profile: No results for input(s): CHOL, HDL, LDLCALC, TRIG, CHOLHDL, LDLDIRECT in the last 72 hours. Thyroid Function Tests: No results for input(s): TSH, T4TOTAL, FREET4, T3FREE, THYROIDAB in the last 72 hours. Anemia Panel: No results for input(s): VITAMINB12, FOLATE, FERRITIN, TIBC, IRON, RETICCTPCT in the last 72 hours. Sepsis Labs: No results for input(s): PROCALCITON, LATICACIDVEN in the last 168 hours.  No results found for this or any previous visit (from the past 240 hour(s)).       Radiology Studies: Dg C-arm 1-60 Min  08/07/2015  CLINICAL DATA:  ORIF left femur EXAM: DG C-ARM 61-120 MIN; LEFT FEMUR 2 VIEWS COMPARISON:  None FLUOROSCOPY TIME:  32 seconds FINDINGS: Existing left total hip arthroplasty. Interval placement of a lateral proximal femoral diaphysis sideplate with multiple cerclage wires transfixing a nondisplaced fracture. Anatomic alignment. No other fracture. No dislocation. IMPRESSION: Interval ORIF left proximal femur fracture. Electronically Signed   By: Kathreen Devoid   On: 08/07/2015 17:37   Dg Femur Min 2 Views Left  08/07/2015  CLINICAL DATA:  ORIF left femur EXAM: DG C-ARM 61-120 MIN; LEFT FEMUR 2 VIEWS COMPARISON:  None FLUOROSCOPY TIME:  32 seconds FINDINGS: Existing left total hip arthroplasty. Interval placement of a lateral proximal femoral diaphysis sideplate with multiple cerclage wires transfixing a nondisplaced fracture. Anatomic alignment. No other fracture. No dislocation. IMPRESSION: Interval ORIF left proximal femur fracture. Electronically Signed   By: Kathreen Devoid   On: 08/07/2015 17:37   Dg Femur Port Min 2 Views  Left  08/07/2015  CLINICAL DATA:  Status post internal fixation of left femoral fracture about the patient's prosthesis. Initial encounter. EXAM: LEFT FEMUR PORTABLE 2 VIEWS COMPARISON:  Left hip CT performed 08/05/2015 FINDINGS: There has been interval placement of a plate and cerclage wires along the proximal left femur, transfixing the left femoral fracture in grossly anatomic alignment. The left hip prosthesis is otherwise unremarkable in appearance. Surrounding postoperative soft tissue air is noted. No new fracture is seen. IMPRESSION: Status post internal fixation of proximal left femoral fracture in grossly anatomic alignment, with plate and cerclage wires. Left hip prosthesis is unremarkable in appearance. Electronically Signed   By: Garald Balding M.D.   On: 08/07/2015 19:06        Scheduled Meds: . amiodarone  200 mg Oral Daily  . atenolol  25 mg Oral Daily  . docusate sodium  100 mg Oral BID  . enoxaparin (LOVENOX) injection  30 mg Subcutaneous Q24H  . levothyroxine  75 mcg Oral QAC breakfast  . pantoprazole  40 mg Oral Q0600  . polyethylene glycol  17 g Oral Daily  . sodium chloride flush  3 mL  Intravenous Q12H   Continuous Infusions: . sodium chloride Stopped (08/09/15 1323)  . lactated ringers       LOS: 2 days    Time spent: 25 MINUTES.     Hosie Poisson, MD [facility] Pager [phone]   If 7PM-7AM, please contact night-coverage [email or web address] Password [email or web address] 08/09/2015, 2:00 PM

## 2015-08-10 DIAGNOSIS — E43 Unspecified severe protein-calorie malnutrition: Secondary | ICD-10-CM

## 2015-08-10 LAB — CBC
HEMATOCRIT: 27.3 % — AB (ref 36.0–46.0)
HEMOGLOBIN: 8.7 g/dL — AB (ref 12.0–15.0)
MCH: 27.7 pg (ref 26.0–34.0)
MCHC: 31.9 g/dL (ref 30.0–36.0)
MCV: 86.9 fL (ref 78.0–100.0)
Platelets: 227 10*3/uL (ref 150–400)
RBC: 3.14 MIL/uL — ABNORMAL LOW (ref 3.87–5.11)
RDW: 14.5 % (ref 11.5–15.5)
WBC: 8.8 10*3/uL (ref 4.0–10.5)

## 2015-08-10 NOTE — Progress Notes (Signed)
PROGRESS NOTE    Jamie Daugherty  [id number] DOB: 12-Dec-1918 DOA: 08/07/2015 PCP: Stephens Shire, MD    Brief Narrative: Jamie Daugherty is a 82 y.o.-year-old female with a left a prosthetic femur fracture presents with a recurrent fracture of left proximal femur fracture after a fall. Underwent  Open reduction internal fixation of left periprosthetic femur fracture with cable plate on QA348G, plan for PT eval and SNF PLACEMENT.    Assessment & Plan:   Active Problems:   PAF (paroxysmal atrial fibrillation) ([place])   Pacemaker - dual chamber KeySpan implanted 2013   Syncope   Dementia without behavioral disturbance   Protein-calorie malnutrition, severe (HCC)   Anemia due to blood loss, acute   Chronic systolic heart failure ([place])   Hip fracture ([place])   Hypertension   Fall   Hip fracture requiring operative repair [facility])   Left proximal femur fracture:    Open reduction internal fixation of left periprosthetic femur fracture with cable plate on QA348G  Pain control and  Muscle relaxant.   PT eval and plan for REHAB in am.     paroxysmal atrial fibrillation - rate controlled, resume atenolol and amiodarone.  - no changes in medications.    Chronic systolic heart failure: Compensated.    Hypertension: controlled.   H/o dementia : No behavioral abnormalities.   Hypothyroidism: resume synthroid.   Hypokalemia: Replete as needed.   Anemia: normocytic. Baseline hemoglobin around 10, . Anemia probably from blood loss peri op.  transfuse to keep hemoglobin greater than 7. Hemoglobin at 8.7  Dementia: No behavioural changes.  Hyponatremia:  Chronic , asymptomatic.    DVT prophylaxis: (Lovenox/) Code Status: (Full) Family Communication: care giver at bedside. Disposition Plan:snf when bed available.    Consultants:   Orthopedics.    Procedures:  6/22 Open reduction internal fixation of left periprosthetic femur fracture with cable  plate   Antimicrobials: ancef one dose.    Subjective: No new complaints.    Objective: Filed Vitals:   08/09/15 1500 08/09/15 2003 08/10/15 0649 08/10/15 1500  BP: 149/59 128/59 145/56 146/70  Pulse: 70 101 70 72  Temp: 98.7 F (37.1 C) 99.5 F (37.5 C) 98.8 F (37.1 C) 98.6 F (37 C)  TempSrc:  Oral Oral Oral  Resp: 16 15 16 14   SpO2: 91% 94% 90% 93%    Intake/Output Summary (Last 24 hours) at 08/10/15 1658 Last data filed at 08/10/15 1300  Gross per 24 hour  Intake    480 ml  Output      0 ml  Net    480 ml   There were no vitals filed for this visit.  Examination:  General exam: comfortable in good mood.  Respiratory system: Clear to auscultation. Respiratory effort normal. Cardiovascular system: S1 & S2 heard, RRR. No JVD, murmurs, rubs, gallops or clicks. No pedal edema. Gastrointestinal system: Abdomen is nondistended, soft and nontender. No organomegaly or masses felt. Normal bowel sounds heard. Central nervous system: Alert and oriented. No focal neurological deficits. Extremities: dressing without any drainage.  Skin: No rashes, lesions or ulcers     Data Reviewed: I have personally reviewed following labs and imaging studies  CBC:  Recent Labs Lab 08/07/15 1850 08/08/15 0526 08/09/15 0411 08/10/15 0408  WBC 11.2* 9.8 7.1 8.8  HGB 10.8* 9.8* 8.5* 8.7*  HCT 33.4* 31.5* 26.1* 27.3*  MCV 88.1 87.0 86.4 86.9  PLT 246 227 197 [id number]   Basic Metabolic Panel:  Recent Labs Lab 08/07/15  1850 08/09/15 0411  NA 133* 130*  K 3.7 3.4*  CL 98* 100*  CO2 26 24  GLUCOSE 115* 103*  BUN 10 12  CREATININE 0.81 0.73  CALCIUM 8.3* 7.4*   GFR: Estimated Creatinine Clearance: 30.2 mL/min (by C-G formula based on Cr of 0.73). Liver Function Tests: No results for input(s): AST, ALT, ALKPHOS, BILITOT, PROT, ALBUMIN in the last 168 hours. No results for input(s): LIPASE, AMYLASE in the last 168 hours. No results for input(s): AMMONIA in the last 168  hours. Coagulation Profile: No results for input(s): INR, PROTIME in the last 168 hours. Cardiac Enzymes: No results for input(s): CKTOTAL, CKMB, CKMBINDEX, TROPONINI in the last 168 hours. BNP (last 3 results) No results for input(s): PROBNP in the last 8760 hours. HbA1C: No results for input(s): HGBA1C in the last 72 hours. CBG: No results for input(s): GLUCAP in the last 168 hours. Lipid Profile: No results for input(s): CHOL, HDL, LDLCALC, TRIG, CHOLHDL, LDLDIRECT in the last 72 hours. Thyroid Function Tests: No results for input(s): TSH, T4TOTAL, FREET4, T3FREE, THYROIDAB in the last 72 hours. Anemia Panel: No results for input(s): VITAMINB12, FOLATE, FERRITIN, TIBC, IRON, RETICCTPCT in the last 72 hours. Sepsis Labs: No results for input(s): PROCALCITON, LATICACIDVEN in the last 168 hours.  No results found for this or any previous visit (from the past 240 hour(s)).       Radiology Studies: No results found.      Scheduled Meds: . amiodarone  200 mg Oral Daily  . atenolol  25 mg Oral Daily  . docusate sodium  100 mg Oral BID  . enoxaparin (LOVENOX) injection  30 mg Subcutaneous Q24H  . levothyroxine  75 mcg Oral QAC breakfast  . pantoprazole  40 mg Oral Q0600  . polyethylene glycol  17 g Oral Daily  . sodium chloride flush  3 mL Intravenous Q12H   Continuous Infusions: . sodium chloride Stopped (08/09/15 1323)  . lactated ringers       LOS: 3 days    Time spent: 25 MINUTES.     Hosie Poisson, MD [facility] Pager [phone]   If 7PM-7AM, please contact night-coverage [email or web address] Password [facility] 08/10/2015, 4:58 PM

## 2015-08-10 NOTE — Progress Notes (Signed)
Subjective: 3 Days Post-Op Procedure(s) (LRB): OPEN REDUCTION INTERNAL FIXATION (ORIF) LEFT  PERIPROSTHETIC FEMUR FRACTURE (Left) Patient reports pain as mild.  No complaints.  Objective: Vital signs in last 24 hours: Temp:  [98.7 F (37.1 C)-99.5 F (37.5 C)] 98.8 F (37.1 C) (06/25 0649) Pulse Rate:  [70-101] 70 (06/25 0649) Resp:  [15-16] 16 (06/25 0649) BP: (128-149)/(56-59) 145/56 mmHg (06/25 0649) SpO2:  [90 %-94 %] 90 % (06/25 0649)  Intake/Output from previous day:   Intake/Output this shift: Total I/O In: 240 [P.O.:240] Out: -    Recent Labs  08/07/15 1850 08/08/15 0526 08/09/15 0411 08/10/15 0408  HGB 10.8* 9.8* 8.5* 8.7*    Recent Labs  08/09/15 0411 08/10/15 0408  WBC 7.1 8.8  RBC 3.02* 3.14*  HCT 26.1* 27.3*  PLT 197 227    Recent Labs  08/07/15 1850 08/09/15 0411  NA 133* 130*  K 3.7 3.4*  CL 98* 100*  CO2 26 24  BUN 10 12  CREATININE 0.81 0.73  GLUCOSE 115* 103*  CALCIUM 8.3* 7.4*   No results for input(s): LABPT, INR in the last 72 hours.  Left hip / lower extremity: Sensation intact distally Intact pulses distally Dorsiflexion/Plantar flexion intact Incision: scant drainage Compartment soft  Assessment/Plan: 3 Days Post-Op Procedure(s) (LRB): OPEN REDUCTION INTERNAL FIXATION (ORIF) LEFT  PERIPROSTHETIC FEMUR FRACTURE (Left) Up with therapy Discharge to SNF when medically ready  [place] 08/10/2015, 9:36 AM

## 2015-08-11 DIAGNOSIS — M9702XD Periprosthetic fracture around internal prosthetic left hip joint, subsequent encounter: Secondary | ICD-10-CM | POA: Diagnosis not present

## 2015-08-11 DIAGNOSIS — M6281 Muscle weakness (generalized): Secondary | ICD-10-CM | POA: Diagnosis not present

## 2015-08-11 DIAGNOSIS — S72002A Fracture of unspecified part of neck of left femur, initial encounter for closed fracture: Secondary | ICD-10-CM | POA: Diagnosis not present

## 2015-08-11 DIAGNOSIS — S72002D Fracture of unspecified part of neck of left femur, subsequent encounter for closed fracture with routine healing: Secondary | ICD-10-CM | POA: Diagnosis not present

## 2015-08-11 DIAGNOSIS — S72042D Displaced fracture of base of neck of left femur, subsequent encounter for closed fracture with routine healing: Secondary | ICD-10-CM | POA: Diagnosis not present

## 2015-08-11 DIAGNOSIS — R2689 Other abnormalities of gait and mobility: Secondary | ICD-10-CM | POA: Diagnosis not present

## 2015-08-11 DIAGNOSIS — I480 Paroxysmal atrial fibrillation: Secondary | ICD-10-CM | POA: Diagnosis not present

## 2015-08-11 DIAGNOSIS — I5020 Unspecified systolic (congestive) heart failure: Secondary | ICD-10-CM | POA: Diagnosis not present

## 2015-08-11 DIAGNOSIS — R278 Other lack of coordination: Secondary | ICD-10-CM | POA: Diagnosis not present

## 2015-08-11 DIAGNOSIS — Z950 Presence of cardiac pacemaker: Secondary | ICD-10-CM | POA: Diagnosis not present

## 2015-08-11 DIAGNOSIS — I4891 Unspecified atrial fibrillation: Secondary | ICD-10-CM | POA: Diagnosis not present

## 2015-08-11 DIAGNOSIS — I10 Essential (primary) hypertension: Secondary | ICD-10-CM | POA: Diagnosis not present

## 2015-08-11 DIAGNOSIS — F0390 Unspecified dementia without behavioral disturbance: Secondary | ICD-10-CM | POA: Diagnosis not present

## 2015-08-11 DIAGNOSIS — I5022 Chronic systolic (congestive) heart failure: Secondary | ICD-10-CM | POA: Diagnosis not present

## 2015-08-11 DIAGNOSIS — Z4789 Encounter for other orthopedic aftercare: Secondary | ICD-10-CM | POA: Diagnosis not present

## 2015-08-11 DIAGNOSIS — G301 Alzheimer's disease with late onset: Secondary | ICD-10-CM | POA: Diagnosis not present

## 2015-08-11 DIAGNOSIS — R41841 Cognitive communication deficit: Secondary | ICD-10-CM | POA: Diagnosis not present

## 2015-08-11 DIAGNOSIS — Z96642 Presence of left artificial hip joint: Secondary | ICD-10-CM | POA: Diagnosis not present

## 2015-08-11 DIAGNOSIS — Z967 Presence of other bone and tendon implants: Secondary | ICD-10-CM | POA: Diagnosis not present

## 2015-08-11 DIAGNOSIS — Z8781 Personal history of (healed) traumatic fracture: Secondary | ICD-10-CM | POA: Diagnosis not present

## 2015-08-11 DIAGNOSIS — R2681 Unsteadiness on feet: Secondary | ICD-10-CM | POA: Diagnosis not present

## 2015-08-11 DIAGNOSIS — Z9181 History of falling: Secondary | ICD-10-CM | POA: Diagnosis not present

## 2015-08-11 DIAGNOSIS — S728X9A Other fracture of unspecified femur, initial encounter for closed fracture: Secondary | ICD-10-CM | POA: Diagnosis not present

## 2015-08-11 DIAGNOSIS — D649 Anemia, unspecified: Secondary | ICD-10-CM | POA: Diagnosis not present

## 2015-08-11 DIAGNOSIS — I509 Heart failure, unspecified: Secondary | ICD-10-CM | POA: Diagnosis not present

## 2015-08-11 DIAGNOSIS — E43 Unspecified severe protein-calorie malnutrition: Secondary | ICD-10-CM | POA: Diagnosis not present

## 2015-08-11 LAB — BASIC METABOLIC PANEL
ANION GAP: 6 (ref 5–15)
BUN: 10 mg/dL (ref 6–20)
CALCIUM: 7.6 mg/dL — AB (ref 8.9–10.3)
CO2: 26 mmol/L (ref 22–32)
CREATININE: 0.63 mg/dL (ref 0.44–1.00)
Chloride: 98 mmol/L — ABNORMAL LOW (ref 101–111)
Glucose, Bld: 96 mg/dL (ref 65–99)
Potassium: 3.6 mmol/L (ref 3.5–5.1)
SODIUM: 130 mmol/L — AB (ref 135–145)

## 2015-08-11 MED ORDER — DOCUSATE SODIUM 100 MG PO CAPS: 100.0000 mg | ORAL_CAPSULE | Freq: Two times a day (BID) | ORAL | Status: DC

## 2015-08-11 NOTE — Care Management Important Message (Signed)
Important Message  Patient Details  Name: Jamie Daugherty MRN: [id number] Date of Birth: 1918-09-18   Medicare Important Message Given:  Yes    Loann Quill 08/11/2015, 8:58 AM

## 2015-08-11 NOTE — Clinical Social Work Placement (Signed)
   CLINICAL SOCIAL WORK PLACEMENT  NOTE  Date:  08/11/2015  Patient Details  Name: Jamie Daugherty MRN: [id number] Date of Birth: February 05, 1919  Clinical Social Work is seeking post-discharge placement for this patient at the [place] level of care (*CSW will initial, date and re-position this form in  chart as items are completed):  Yes   Patient/family provided with [place] Work Department's list of facilities offering this level of care within the geographic area requested by the patient (or if unable, by the patient's family).  Yes   Patient/family informed of their freedom to choose among providers that offer the needed level of care, that participate in Medicare, Medicaid or managed care program needed by the patient, have an available bed and are willing to accept the patient.  Yes   Patient/family informed of [employer]'s ownership interest in [facility] and [facility], as well as of the fact that they are under no obligation to receive care at these facilities.  PASRR submitted to EDS on       PASRR number received on       Existing PASRR number confirmed on 08/08/15     FL2 transmitted to all facilities in geographic area requested by pt/family on 08/08/15     FL2 transmitted to all facilities within larger geographic area on       Patient informed that his/her managed care company has contracts with or will negotiate with certain facilities, including the following:        Yes   Patient/family informed of bed offers received.  Patient chooses bed at [facility]     Physician recommends and patient chooses bed at      Patient to be transferred to [facility] on 08/11/15.  Patient to be transferred to facility by [facility]     Patient family notified on 08/11/15 of transfer.  Name of family member notified:  Son     PHYSICIAN Please sign FL2     Additional Comment:     _______________________________________________ Caroline Sauger, LCSW 08/11/2015, 12:15 PM

## 2015-08-11 NOTE — Clinical Social Work Note (Signed)
Patient to be discharged to [facility]. Patient's son updated (left message). Patient to be transported via EMS. RN report number: [place], [place] [facility]: [phone] Surgical: [phone]

## 2015-08-11 NOTE — Anesthesia Postprocedure Evaluation (Signed)
Anesthesia Post Note  Patient: Jamie Daugherty  Procedure(s) Performed: Procedure(s) (LRB): OPEN REDUCTION INTERNAL FIXATION (ORIF) LEFT  PERIPROSTHETIC FEMUR FRACTURE (Left)  Patient location during evaluation: PACU Anesthesia Type: Epidural Level of consciousness: sedated Pain management: pain level controlled Vital Signs Assessment: post-procedure vital signs reviewed and stable Respiratory status: spontaneous breathing and respiratory function stable Cardiovascular status: stable Anesthetic complications: no    Neita Landrigan DANIEL

## 2015-08-11 NOTE — Discharge Summary (Signed)
Physician Discharge Summary  Jamie Daugherty [id number] DOB: Mar 27, 1918 DOA: 08/07/2015  PCP: Stephens Shire, MD  Admit date: 08/07/2015 Discharge date: 08/11/2015  Admitted From: SNF Disposition:  SNF  Recommendations for Outpatient Follow-up:  1. Follow up with PCP in 1-2 weeks 2. Please obtain BMP/CBC in 2 days.  3. Please follow up with orthopedics as recommended.    Discharge Condition:stable. CODE STATUS:full code.  Diet recommendation: Heart Healthy  Brief/Interim Summary: Jamie Daugherty is an 82 y.o.-year-old female with a left a prosthetic femur fracture presents with a recurrent fracture of left proximal femur fracture after a fall. Underwent Open reduction internal fixation of left periprosthetic femur fracture with cable plate on QA348G, plan for PT eval and SNF PLACEMENT.   Discharge Diagnoses:  Active Problems:   PAF (paroxysmal atrial fibrillation) ([place])   Pacemaker - dual chamber [employer] implanted 2013   Syncope   Dementia without behavioral disturbance   Protein-calorie malnutrition, severe (H[facility])   Anemia due to blood loss, acute   Chronic systolic heart failure ([place])   Hip fracture ([place])   Hypertension   Fall   Hip fracture requiring operative repair [facility])  Left proximal femur fracture:   Open reduction internal fixation of left periprosthetic femur fracture with cable plate on QA348G  Pain control and Muscle relaxant.   PT eval and plan for [facility] on discharge.   paroxysmal atrial fibrillation - rate controlled, resume atenolol and amiodarone.  - no changes in medications.    Chronic systolic heart failure: Compensated.    Hypertension: controlled.   H/o dementia : No behavioral abnormalities.   Hypothyroidism: resume synthroid.   Hypokalemia: Repleted as needed.   Anemia: normocytic. Baseline hemoglobin around 10, . Anemia probably from blood loss peri op.  transfuse to keep hemoglobin greater than 7. Hemoglobin at  8.7   Hyponatremia:  Chronic , asymptomatic.   Discharge Instructions  Discharge Instructions    Discharge instructions    Complete by:  As directed   Please follow upw ith PCP in one week.  Please follow up with orthopedics as recommended.  Please check your BMP in 2 days.     Weight bearing as tolerated    Complete by:  As directed             Medication List    STOP taking these medications        oxyCODONE-acetaminophen 5-325 MG tablet  Commonly known as:  PERCOCET      TAKE these medications        amiodarone 200 MG tablet  Commonly known as:  PACERONE  Take 1 tablet (200 mg total) by mouth daily.     atenolol 25 MG tablet  Commonly known as:  TENORMIN  Take 1 tablet (25 mg total) by mouth daily.     docusate sodium 100 MG capsule  Commonly known as:  COLACE  Take 1 capsule (100 mg total) by mouth 2 (two) times daily.     enoxaparin 40 MG/0.4ML injection  Commonly known as:  LOVENOX  Inject 0.4 mLs (40 mg total) into the skin daily.     HYDROcodone-acetaminophen 7.5-325 MG tablet  Commonly known as:  NORCO  Take 1-2 tablets by mouth every 6 (six) hours as needed for moderate pain.     levothyroxine 75 MCG tablet  Commonly known as:  SYNTHROID, LEVOTHROID  Take 1 tablet (75 mcg total) by mouth daily before breakfast.     polyethylene glycol packet  Commonly known as:  MIRALAX / GLYCOLAX  Take 17 g by mouth daily.           Follow-up Information    Follow up with Marianna Payment, MD In 1 week.   Specialty:  Orthopedic Surgery   Why:  For wound re-check   Contact information:   [place] [phone]       Follow up with BURNETT,BRENT A, MD. Schedule an appointment as soon as possible for a visit in 1 week.   Specialty:  Family Medicine   Contact information:   [address] [place] [address] [phone]      Allergies  Allergen Reactions  . Penicillins Other (See Comments)     Unknown. Tolerates cephalexin.    Consultations:  Orthopedics Dr Erlinda Hong.   Procedures/Studies: Dg Chest 2 View  08/02/2015  CLINICAL DATA:  Fall. EXAM: CHEST  2 VIEW COMPARISON:  Radiograph of April 18, 2015 and November 05, 2012. FINDINGS: The heart size and mediastinal contours are within normal limits. Left lung is clear. Stable right apical density is noted most consistent with scarring. No pneumothorax or pleural effusion is noted. Left-sided pacemaker is unchanged in position. Atherosclerosis of thoracic aorta is noted. The visualized skeletal structures are unremarkable. IMPRESSION: No active cardiopulmonary disease. Electronically Signed   By: Marijo Conception, M.D.   On: 08/02/2015 10:50   Ct Head Wo Contrast  08/02/2015  CLINICAL DATA:  Fall without reported loss of consciousness. EXAM: CT HEAD WITHOUT CONTRAST TECHNIQUE: Contiguous axial images were obtained from the base of the skull through the vertex without intravenous contrast. COMPARISON:  None. FINDINGS: Bony calvarium appears intact. Mild diffuse cortical atrophy is noted. Mild chronic ischemic white matter disease is noted. No mass effect or midline shift is noted. Ventricular size is within normal limits. There is no evidence of mass lesion, hemorrhage or acute infarction. IMPRESSION: Mild diffuse cortical atrophy. Mild chronic ischemic white matter disease. No acute intracranial abnormality seen. Electronically Signed   By: Marijo Conception, M.D.   On: 08/02/2015 09:59   Ct Hip Left Wo Contrast  08/05/2015  CLINICAL DATA:  Left hip arthroplasty March 2017. Status post fall. Pain in left hip. EXAM: CT OF THE LEFT HIP WITHOUT CONTRAST TECHNIQUE: Multidetector CT imaging of the left hip was performed according to the standard protocol. Multiplanar CT image reconstructions were also generated. COMPARISON:  None. FINDINGS: Bones/Joint/Cartilage Left total hip arthroplasty without hardware failure or complication. Left proximal femoral  fracture along the stem component of the left hip arthroplasty with the fracture terminating at the tip of the femoral stem component. No hip dislocation. Normal alignment. No aggressive lytic or sclerotic osseous lesion. Muscles No muscle atrophy. Soft tissue No fluid collection or hematoma. No soft tissue mass. Peripheral vascular atherosclerotic disease. IMPRESSION: Left total hip arthroplasty with a left proximal femoral fracture along the stem component of the left hip arthroplasty with the fracture terminating at the tip of the femoral stem component. Electronically Signed   By: Kathreen Devoid   On: 08/05/2015 16:09   Dg C-arm 1-60 Min  08/07/2015  CLINICAL DATA:  ORIF left femur EXAM: DG C-ARM 61-120 MIN; LEFT FEMUR 2 VIEWS COMPARISON:  None FLUOROSCOPY TIME:  32 seconds FINDINGS: Existing left total hip arthroplasty. Interval placement of a lateral proximal femoral diaphysis sideplate with multiple cerclage wires transfixing a nondisplaced fracture. Anatomic alignment. No other fracture. No dislocation. IMPRESSION: Interval ORIF left proximal femur fracture. Electronically  Signed   By: Kathreen Devoid   On: 08/07/2015 17:37   Dg Hip Unilat With Pelvis 2-3 Views Left  08/02/2015  CLINICAL DATA:  Left hip pain after fall. EXAM: DG HIP (WITH OR WITHOUT PELVIS) 2-3V LEFT COMPARISON:  Radiographs of April 17, 2015. FINDINGS: Status post left hemiarthroplasty. No fracture or dislocation is noted. Prosthesis appears to be well situated. Right hip appears normal. IMPRESSION: No acute abnormality seen in the left hip. Electronically Signed   By: Marijo Conception, M.D.   On: 08/02/2015 10:52   Dg Femur Min 2 Views Left  08/07/2015  CLINICAL DATA:  ORIF left femur EXAM: DG C-ARM 61-120 MIN; LEFT FEMUR 2 VIEWS COMPARISON:  None FLUOROSCOPY TIME:  32 seconds FINDINGS: Existing left total hip arthroplasty. Interval placement of a lateral proximal femoral diaphysis sideplate with multiple cerclage wires transfixing a  nondisplaced fracture. Anatomic alignment. No other fracture. No dislocation. IMPRESSION: Interval ORIF left proximal femur fracture. Electronically Signed   By: Kathreen Devoid   On: 08/07/2015 17:37   Dg Femur Port Min 2 Views Left  08/07/2015  CLINICAL DATA:  Status post internal fixation of left femoral fracture about the patient's prosthesis. Initial encounter. EXAM: LEFT FEMUR PORTABLE 2 VIEWS COMPARISON:  Left hip CT performed 08/05/2015 FINDINGS: There has been interval placement of a plate and cerclage wires along the proximal left femur, transfixing the left femoral fracture in grossly anatomic alignment. The left hip prosthesis is otherwise unremarkable in appearance. Surrounding postoperative soft tissue air is noted. No new fracture is seen. IMPRESSION: Status post internal fixation of proximal left femoral fracture in grossly anatomic alignment, with plate and cerclage wires. Left hip prosthesis is unremarkable in appearance. Electronically Signed   By: Garald Balding M.D.   On: 08/07/2015 19:06       Subjective:  No new complaints.  Discharge Exam: Filed Vitals:   08/11/15 0616 08/11/15 0833  BP: 167/62 121/53  Pulse: 72 83  Temp:  98.8 F (37.1 C)  Resp:  18   Filed Vitals:   08/10/15 1500 08/10/15 1954 08/11/15 0616 08/11/15 0833  BP: 146/70 129/56 167/62 121/53  Pulse: 72 71 72 83  Temp: 98.6 F (37 C) 98.4 F (36.9 C)  98.8 F (37.1 C)  TempSrc: Oral Oral  Oral  Resp: 14 14  18   SpO2: 93% 94% 90% 92%    General: Pt is alert, awake, not in acute distress Cardiovascular: RRR, S1/S2 +, no rubs, no gallops Respiratory: CTA bilaterally, no wheezing, no rhonchi Abdominal: Soft, NT, ND, bowel sounds + Extremities: no edema, no cyanosis    The results of significant diagnostics from this hospitalization (including imaging, microbiology, ancillary and laboratory) are listed below for reference.     Microbiology: No results found for this or any previous visit  (from the past 240 hour(s)).   Labs: BNP (last 3 results) No results for input(s): BNP in the last 8760 hours. Basic Metabolic Panel:  Recent Labs Lab 08/07/15 1850 08/09/15 0411 08/11/15 0445  NA 133* 130* 130*  K 3.7 3.4* 3.6  CL 98* 100* 98*  CO2 26 24 26   GLUCOSE 115* 103* 96  BUN 10 12 10   CREATININE 0.81 0.73 0.63  CALCIUM 8.3* 7.4* 7.6*   Liver Function Tests: No results for input(s): AST, ALT, ALKPHOS, BILITOT, PROT, ALBUMIN in the last 168 hours. No results for input(s): LIPASE, AMYLASE in the last 168 hours. No results for input(s): AMMONIA in the last 168 hours.  CBC:  Recent Labs Lab 08/07/15 1850 08/08/15 0526 08/09/15 0411 08/10/15 0408  WBC 11.2* 9.8 7.1 8.8  HGB 10.8* 9.8* 8.5* 8.7*  HCT 33.4* 31.5* 26.1* 27.3*  MCV 88.1 87.0 86.4 86.9  PLT 246 227 197 227   Cardiac Enzymes: No results for input(s): CKTOTAL, CKMB, CKMBINDEX, TROPONINI in the last 168 hours. BNP: Invalid input(s): POCBNP CBG: No results for input(s): GLUCAP in the last 168 hours. D-Dimer No results for input(s): DDIMER in the last 72 hours. Hgb A1c No results for input(s): HGBA1C in the last 72 hours. Lipid Profile No results for input(s): CHOL, HDL, LDLCALC, TRIG, CHOLHDL, LDLDIRECT in the last 72 hours. Thyroid function studies No results for input(s): TSH, T4TOTAL, T3FREE, THYROIDAB in the last 72 hours.  Invalid input(s): FREET3 Anemia work up No results for input(s): VITAMINB12, FOLATE, FERRITIN, TIBC, IRON, RETICCTPCT in the last 72 hours. Urinalysis    Component Value Date/Time   COLORURINE YELLOW 08/02/2015 0940   APPEARANCEUR CLEAR 08/02/2015 0940   LABSPEC 1.011 08/02/2015 0940   PHURINE 8.0 08/02/2015 0940   GLUCOSEU NEGATIVE 08/02/2015 0940   HGBUR NEGATIVE 08/02/2015 0940   BILIRUBINUR NEGATIVE 08/02/2015 0940   KETONESUR NEGATIVE 08/02/2015 0940   PROTEINUR NEGATIVE 08/02/2015 0940   UROBILINOGEN 0.2 10/19/2014 1020   NITRITE NEGATIVE 08/02/2015 0940    LEUKOCYTESUR NEGATIVE 08/02/2015 0940   Sepsis Labs Invalid input(s): PROCALCITONIN,  WBC,  LACTICIDVEN Microbiology No results found for this or any previous visit (from the past 240 hour(s)).   Time coordinating discharge: Over 30 minutes  SIGNED:   Hosie Poisson, MD  [facility] 08/11/2015, 11:29 AM Pager [phone]  If 7PM-7AM, please contact night-coverage [email or web address] Password [email or web address]

## 2015-08-11 NOTE — Progress Notes (Signed)
   Subjective:  Patient reports pain as mild.   Objective:   VITALS:   Filed Vitals:   08/10/15 0649 08/10/15 1500 08/10/15 1954 08/11/15 0616  BP: 145/56 146/70 129/56 167/62  Pulse: 70 72 71 72  Temp: 98.8 F (37.1 C) 98.6 F (37 C) 98.4 F (36.9 C)   TempSrc: Oral Oral Oral   Resp: 16 14 14    SpO2: 90% 93% 94% 90%    Neurologically intact Neurovascular intact Sensation intact distally Intact pulses distally Dorsiflexion/Plantar flexion intact Incision: dressing C/D/I and no drainage No cellulitis present Compartment soft   Lab Results  Component Value Date   WBC 8.8 08/10/2015   HGB 8.7* 08/10/2015   HCT 27.3* 08/10/2015   MCV 86.9 08/10/2015   PLT 227 08/10/2015     Assessment/Plan:  4 Days Post-Op   - stable from ortho stand point for dc to SNF when bed available  Marianna Payment 08/11/2015, 7:51 AM [phone]

## 2015-08-11 NOTE — Progress Notes (Signed)
Pt discharge education and instructions completed with pt and caregiver. Pt discharge to [facility] SNF and report called off to Jonette Mate at the facility. Pt IV removed; pt left hip incision dsg remains clean, dry and intact with not active bleeding or stain noted; pt transported off unit via stretcher with belongings and caregiver at the side. Delia Heady RN

## 2015-08-11 NOTE — Progress Notes (Signed)
Physical Therapy Treatment Patient Details Name: Jamie Daugherty MRN: [id number] DOB: 11/29/1918 Today's Date: 08/11/2015    History of Present Illness 82 y.o. female with periprosthetic femur fracture following a fall at home (believed related to syncope). Pt now s/p open reduction internal fixation of left periprosthetic femur fracture with cable plate.PMH: hypertension, atrial fibrillation, syncope, pacemaker, anterior THA.     PT Comments    Pt able to progress to ambulating 12 ft with rw and min assist. Pt up in chair following with caregiver present. Anticipate pt will D/C to SNF following acute stay.   Follow Up Recommendations  SNF;Supervision/Assistance - 24 hour     Equipment Recommendations  None recommended by PT    Recommendations for Other Services       Precautions / Restrictions Precautions Precautions: Fall Precaution Comments: pacemaker Restrictions Weight Bearing Restrictions: Yes LLE Weight Bearing: Weight bearing as tolerated    Mobility  Bed Mobility Overal bed mobility: Needs Assistance Bed Mobility: Supine to Sit     Supine to sit: Min guard     General bed mobility comments: HOB elevated, using rail to assist  Transfers Overall transfer level: Needs assistance Equipment used: Rolling Stroot (2 wheeled) Transfers: Sit to/from Stand Sit to Stand: Min assist         General transfer comment: cues for hand placement  Ambulation/Gait Ambulation/Gait assistance: Min assist Ambulation Distance (Feet): 12 Feet Assistive device: Rolling Peden (2 wheeled) Gait Pattern/deviations: Step-to pattern;Decreased weight shift to left;Decreased stance time - left Gait velocity: decreased   General Gait Details: mild instability during ambulation, assist needed.    Stairs            Wheelchair Mobility    Modified Rankin (Stroke Patients Only)       Balance Overall balance assessment: Needs assistance Sitting-balance support: Single  extremity supported Sitting balance-Leahy Scale: Poor     Standing balance support: Bilateral upper extremity supported Standing balance-Leahy Scale: Poor Standing balance comment: using rw and assist for standing                    Cognition Arousal/Alertness: Awake/alert Behavior During Therapy: WFL for tasks assessed/performed Overall Cognitive Status: History of cognitive impairments - at baseline                      Exercises      General Comments        Pertinent Vitals/Pain Pain Assessment: Faces Faces Pain Scale: Hurts even more (with activity) Pain Location: lt hip/thigh Pain Descriptors / Indicators: Guarding;Grimacing Pain Intervention(s): Limited activity within patient's tolerance;Monitored during session    Home Living                      Prior Function            PT Goals (current goals can now be found in the care plan section) Acute Rehab PT Goals Patient Stated Goal: not expressed PT Goal Formulation: With patient Time For Goal Achievement: 08/22/15 Potential to Achieve Goals: Good Progress towards PT goals: Progressing toward goals    Frequency  Min 3X/week    PT Plan Current plan remains appropriate    Co-evaluation             End of Session Equipment Utilized During Treatment: Gait belt Activity Tolerance: Patient tolerated treatment well Patient left: in chair;with call bell/phone within reach;with family/visitor present     Time: [id number] PT Time Calculation (  min) (ACUTE ONLY): 14 min  Charges:  $Gait Training: 8-22 mins                    G Codes:      Cassell Clement, PT, CSCS Pager [phone] Office [phone]  08/11/2015, 11:55 AM

## 2015-08-12 DIAGNOSIS — I10 Essential (primary) hypertension: Secondary | ICD-10-CM | POA: Diagnosis not present

## 2015-08-12 DIAGNOSIS — I509 Heart failure, unspecified: Secondary | ICD-10-CM | POA: Diagnosis not present

## 2015-08-12 DIAGNOSIS — G301 Alzheimer's disease with late onset: Secondary | ICD-10-CM | POA: Diagnosis not present

## 2015-08-12 DIAGNOSIS — I480 Paroxysmal atrial fibrillation: Secondary | ICD-10-CM | POA: Diagnosis not present

## 2015-08-12 DIAGNOSIS — D649 Anemia, unspecified: Secondary | ICD-10-CM | POA: Diagnosis not present

## 2015-08-12 DIAGNOSIS — S72002A Fracture of unspecified part of neck of left femur, initial encounter for closed fracture: Secondary | ICD-10-CM | POA: Diagnosis not present

## 2015-08-15 ENCOUNTER — Telehealth: Payer: Self-pay

## 2015-08-15 NOTE — Telephone Encounter (Signed)
Faxed implanted cardiac device form to [facility]

## 2015-08-18 DIAGNOSIS — S72042D Displaced fracture of base of neck of left femur, subsequent encounter for closed fracture with routine healing: Secondary | ICD-10-CM | POA: Diagnosis not present

## 2015-09-02 DIAGNOSIS — I480 Paroxysmal atrial fibrillation: Secondary | ICD-10-CM | POA: Diagnosis not present

## 2015-09-02 DIAGNOSIS — G301 Alzheimer's disease with late onset: Secondary | ICD-10-CM | POA: Diagnosis not present

## 2015-09-02 DIAGNOSIS — I509 Heart failure, unspecified: Secondary | ICD-10-CM | POA: Diagnosis not present

## 2015-09-04 ENCOUNTER — Ambulatory Visit (INDEPENDENT_AMBULATORY_CARE_PROVIDER_SITE_OTHER): Payer: Medicare Other | Admitting: Cardiovascular Disease

## 2015-09-04 ENCOUNTER — Telehealth: Payer: Self-pay | Admitting: Cardiovascular Disease

## 2015-09-04 ENCOUNTER — Encounter: Payer: Self-pay | Admitting: Cardiovascular Disease

## 2015-09-04 VITALS — BP 116/71 | HR 94 | Ht 67.0 in | Wt 92.4 lb

## 2015-09-04 DIAGNOSIS — I480 Paroxysmal atrial fibrillation: Secondary | ICD-10-CM

## 2015-09-04 DIAGNOSIS — F0390 Unspecified dementia without behavioral disturbance: Secondary | ICD-10-CM

## 2015-09-04 DIAGNOSIS — Z950 Presence of cardiac pacemaker: Secondary | ICD-10-CM

## 2015-09-04 DIAGNOSIS — E43 Unspecified severe protein-calorie malnutrition: Secondary | ICD-10-CM | POA: Diagnosis not present

## 2015-09-04 DIAGNOSIS — I951 Orthostatic hypotension: Secondary | ICD-10-CM

## 2015-09-04 DIAGNOSIS — I5022 Chronic systolic (congestive) heart failure: Secondary | ICD-10-CM | POA: Diagnosis not present

## 2015-09-04 DIAGNOSIS — I495 Sick sinus syndrome: Secondary | ICD-10-CM

## 2015-09-04 MED ORDER — ATENOLOL 25 MG PO TABS: 12.5000 mg | ORAL_TABLET | Freq: Every day | ORAL | Status: DC

## 2015-09-04 NOTE — Telephone Encounter (Signed)
Returned call to [place]. They need to know when the TSH is needed and 12 lead EKG that Dr Sallyanne Kuster wrote on progress note sheet. Clarified with [place] that Dr Sallyanne Kuster wanted TSH now and in 6 months ordered TSH and CMP. This information given to [name] who verbalized understanding. Waiting for clarification on EKG.

## 2015-09-04 NOTE — Telephone Encounter (Signed)
New message     [name] would like to know if pt was having TSH and a 12 Lead EKG done here? Please call.

## 2015-09-04 NOTE — Patient Instructions (Signed)
Dr Sallyanne Kuster has recommended making the following medication changes: 1. DECREASE Atenolol to 12.5 mg daily - take 0.5 tablet by mouth daily  Your physician recommends that you return for lab work at your earliest convenience and in 6 months (just before your follow-up with Dr C). You will receive lab slips in the mail.  Remote monitoring is used to monitor your Pacemaker of ICD from home. This monitoring reduces the number of office visits required to check your device to one time per year. It allows [place] to keep an eye on the functioning of your device to ensure it is working properly. You are scheduled for a device check from home on Thursday, October 19th, 2017. You may send your transmission at any time that day. If you have a wireless device, the transmission will be sent automatically. After your physician reviews your transmission, you will receive a postcard with your next transmission date.  Dr Sallyanne Kuster recommends that you schedule a follow-up appointment in 6 months with a device check. You will receive a reminder letter in the mail two months in advance. If you don't receive a letter, please call our office to schedule the follow-up appointment.  If you need a refill on your cardiac medications before your next appointment, please call your pharmacy.

## 2015-09-04 NOTE — Telephone Encounter (Signed)
Spoke with Dr Sallyanne Kuster. He does not need an EKG at the facility. Asked [name] to fax the consult sheet to [place] at [phone]. She said she would do that shortly.  Will route to [employer].

## 2015-09-05 DIAGNOSIS — S72042D Displaced fracture of base of neck of left femur, subsequent encounter for closed fracture with routine healing: Secondary | ICD-10-CM | POA: Diagnosis not present

## 2015-09-06 DIAGNOSIS — I495 Sick sinus syndrome: Secondary | ICD-10-CM | POA: Insufficient documentation

## 2015-09-06 NOTE — Progress Notes (Signed)
Patient ID: Jamie Daugherty, female   DOB: Jun 18, 1918, 82 y.o.   MRN: [id number]     Cardiology Office Note    Date:  09/06/2015   ID:  Jamie Daugherty, DOB 1918/07/09, MRN [id number]  PCP:  Stephens Shire, MD  Cardiologist:   Sanda Klein, MD   Chief Complaint  Patient presents with  . Follow-up    lightheaded; occasionally.    History of Present Illness:  Jamie Daugherty is a 82 y.o. female with tachycardia related cardiomyopathy and systolic heart failure related to atrial fibrillation with rapid ventricular response, also with a history of tachycardia-bradycardia syndrome for which received a dual-chamber [employer] pacemaker in 2013.   For most of 2016 she had a lot of problems with atrial fibrillation rapid ventricular response leading to heart failure decompensation. Eventually we had to start amiodarone for rate control. Once better rate control was achieved there was evidence of slow improvement in left ventricular systolic function, from minimum EF of 25% in August to 35% in November 2016. She actually returned to normal sinus rhythm in September 2016. Also in September, Anticoagulation with Xarelto was stopped due to severe epistaxis requiring transfusion. In view of her advanced age and frailty, the decision was made not to restart anticoagulation.  On April 16, 2015 she was hospitalized with a left femoral neck fracture, possibly due to a fall (her dementia does not allow accurate history). She underwent hip replacement on March 2. She had another fall in 08/07/2015 and had to have repeat surgery. She is currently a rehab resident at [facility] with a plan to stay on there in assisted living.  She has evidence of hypothyroidism probably worsened by amiodarone. The dose of levothyroxine has been adjusted and she had a normal TSH in February 2017. A recent random cortisol level was fairly high.  Pacemaker interrogation shows normal device function. She has not had any atrial  fib since the last device check but has 100% atrial pacing and only 10% ventricular pacing. Heart rate histogram distribution is good. Her device is a Chemical engineer ingenio implanted in 2013 and has roughly 4.5 years of anticipated generator longevity.  Past Medical History  Diagnosis Date  . Hypertension   . Atrial fibrillation ([place])   . Syncope   . Pacemaker - dual chamber [facility] implanted 2013 01/25/2013  . Hypothyroidism   . Arthritis   . Anemia   . History of blood transfusion     Past Surgical History  Procedure Laterality Date  . Pacemaker insertion    . Abdominal hysterectomy    . Anterior approach hemi hip arthroplasty Left 04/17/2015    Procedure: ANTERIOR APPROACH LEFT HEMI HIP ARTHROPLASTY;  Surgeon: Leandrew Koyanagi, MD;  Location: [place];  Service: Orthopedics;  Laterality: Left;  . Orif femur fracture Left 08/07/2015    Procedure: OPEN REDUCTION INTERNAL FIXATION (ORIF) LEFT  PERIPROSTHETIC FEMUR FRACTURE;  Surgeon: Leandrew Koyanagi, MD;  Location: [place];  Service: Orthopedics;  Laterality: Left;    Outpatient Prescriptions Prior to Visit  Medication Sig Dispense Refill  . amiodarone (PACERONE) 200 MG tablet Take 1 tablet (200 mg total) by mouth daily. 90 tablet 3  . docusate sodium (COLACE) 100 MG capsule Take 1 capsule (100 mg total) by mouth 2 (two) times daily. 10 capsule 0  . enoxaparin (LOVENOX) 40 MG/0.4ML injection Inject 0.4 mLs (40 mg total) into the skin daily. 14 Syringe 0  . HYDROcodone-acetaminophen (NORCO) 7.5-325 MG tablet Take 1-2 tablets by  mouth every 6 (six) hours as needed for moderate pain. 90 tablet 0  . levothyroxine (SYNTHROID, LEVOTHROID) 75 MCG tablet Take 1 tablet (75 mcg total) by mouth daily before breakfast. 30 tablet 6  . polyethylene glycol (MIRALAX / GLYCOLAX) packet Take 17 g by mouth daily. 14 each 0  . atenolol (TENORMIN) 25 MG tablet Take 1 tablet (25 mg total) by mouth daily. 30 tablet 11   No facility-administered  medications prior to visit.     Allergies:   Penicillins   Social History   Social History  . Marital Status: Unknown    Spouse Name: N/A  . Number of Children: N/A  . Years of Education: N/A   Social History Main Topics  . Smoking status: Former Research scientist (life sciences)  . Smokeless tobacco: Never Used  . Alcohol Use: Yes     Comment: occ  . Drug Use: No  . Sexual Activity: Not Asked   Other Topics Concern  . None   Social History Narrative     Family History:  The patient's family history includes Heart disease in her father and mother.   ROS:   Please see the history of present illness.    ROS All other systems reviewed and are negative.   PHYSICAL EXAM:   VS:  BP 116/71 mmHg  Pulse 94  Ht 5\' 7"  (1.702 m)  Wt 41.912 kg (92 lb 6.4 oz)  BMI 14.47 kg/m2   GEN: Very thin, frail, unable to stand on scales, in no acute distress HEENT: normal Neck: no JVD, carotid bruits, or masses Cardiac: RRR; no murmurs, rubs, or gallops,no edema, healthy pacemaker site Respiratory:  clear to auscultation bilaterally, normal work of breathing GI: soft, nontender, nondistended, + BS MS: no deformity or atrophy Skin: warm and dry, no rash Neuro:  Alert and Oriented x 3, Strength and sensation are intact Psych: euthymic mood, full affect  Wt Readings from Last 3 Encounters:  09/04/15 41.912 kg (92 lb 6.4 oz)  08/02/15 44.453 kg (98 lb)  12/23/14 44.634 kg (98 lb 6.4 oz)      Studies/Labs Reviewed:   EKG:  EKG is ordered today. Atrial paced, ventricular sensed with atypical left bundle branch block. QRS 150 ms, QTC 596 ms  Recent Labs: 04/04/2015: TSH 3.55 04/23/2015: Magnesium 1.9 08/02/2015: ALT 15 08/10/2015: Hemoglobin 8.7*; Platelets 227 08/11/2015: BUN 10; Creatinine, Ser 0.63; Potassium 3.6; Sodium 130*   Lipid Panel No results found for: CHOL, TRIG, HDL, CHOLHDL, VLDL, LDLCALC, LDLDIRECT  Additional studies/ records that were reviewed today include:  Records from  hospitalization   ASSESSMENT:    1. PAF (paroxysmal atrial fibrillation) ([place])   2. Chronic systolic heart failure ([place])   3. Protein-calorie malnutrition, severe ([place])   4. SSS (sick sinus syndrome) ([place])   5. Pacemaker - dual chamber [employer] implanted 2013   6. Orthostasis   7. Dementia without behavioral disturbance      PLAN:  In order of problems listed above:  1. AFib: Currently normal sinus rhythm. Embolic risk is high (CHADSVasc 5: age 82, gender, HTN, CHF), but unfortunately she cannot take anticoagulation due to age/frailty/serious recent bleeding requiring transfusion/falls resulting in serious injury. 2. CHF:  She currently appears to be euvolemic, without the need for diuretics. Suspect that her ejection fraction may be even better than it was last November. Hopefully, EF is back to normal (2014 EF 55-60%). She cannot receive Ace inhibitors or angiotensin receptor blockers due to hypotension.  3. Underweight: She  is severely underweight/malnourished and I'm sure this is contributing to her hypotension. 4. SSS:  Requires 100% atrial pacing, very infrequent ventricular pacing 5. s/p PM: Normal pacemaker function, no device reprogramming was necessary today. She is not pacemaker dependent 6. Orthostatic hypotension: We'll try to further reduce the dose of her beta blocker and anticipated that we may have to stop it altogether 7. Dementia: Little change. Does impede history and review of systems.    Medication Adjustments/Labs and Tests Ordered: Current medicines are reviewed at length with the patient today.  Concerns regarding medicines are outlined above.  Medication changes, Labs and Tests ordered today are listed in the Patient Instructions below. Patient Instructions  Dr Sallyanne Kuster has recommended making the following medication changes: 1. DECREASE Atenolol to 12.5 mg daily - take 0.5 tablet by mouth daily  Your physician recommends that you return for lab  work at your earliest convenience and in 6 months (just before your follow-up with Dr C). You will receive lab slips in the mail.  Remote monitoring is used to monitor your Pacemaker of ICD from home. This monitoring reduces the number of office visits required to check your device to one time per year. It allows [place] to keep an eye on the functioning of your device to ensure it is working properly. You are scheduled for a device check from home on Thursday, October 19th, 2017. You may send your transmission at any time that day. If you have a wireless device, the transmission will be sent automatically. After your physician reviews your transmission, you will receive a postcard with your next transmission date.  Dr Sallyanne Kuster recommends that you schedule a follow-up appointment in 6 months with a device check. You will receive a reminder letter in the mail two months in advance. If you don't receive a letter, please call our office to schedule the follow-up appointment.  If you need a refill on your cardiac medications before your next appointment, please call your pharmacy.        Signed, Sanda Klein, MD  09/06/2015 4:51 PM    [address], [place], [place]  [address] Phone: [phone]; Fax: [phone]

## 2015-09-16 LAB — CUP PACEART INCLINIC DEVICE CHECK
Battery Remaining Longevity: 54 mo
Brady Statistic RA Percent Paced: 100 %
Date Time Interrogation Session: 20170801113153
Implantable Lead Implant Date: 20130729
Implantable Lead Location: 753860
Implantable Lead Model: 4456
Implantable Lead Model: 4479
Implantable Lead Serial Number: 522401
Lead Channel Setting Pacing Amplitude: 2.5 V
Lead Channel Setting Pacing Amplitude: 2.6 V
Lead Channel Setting Pacing Pulse Width: 0.4 ms
MDC IDC LEAD IMPLANT DT: 20130729
MDC IDC LEAD LOCATION: 753859
MDC IDC LEAD SERIAL: 466820
MDC IDC PG SERIAL: 131216
MDC IDC SET LEADCHNL RV SENSING SENSITIVITY: 1 mV
MDC IDC STAT BRADY RV PERCENT PACED: 6 %

## 2015-09-17 DIAGNOSIS — G301 Alzheimer's disease with late onset: Secondary | ICD-10-CM | POA: Diagnosis not present

## 2015-09-17 DIAGNOSIS — D649 Anemia, unspecified: Secondary | ICD-10-CM | POA: Diagnosis not present

## 2015-09-17 DIAGNOSIS — I480 Paroxysmal atrial fibrillation: Secondary | ICD-10-CM | POA: Diagnosis not present

## 2015-09-17 DIAGNOSIS — I509 Heart failure, unspecified: Secondary | ICD-10-CM | POA: Diagnosis not present

## 2015-09-19 ENCOUNTER — Encounter: Payer: Self-pay | Admitting: Cardiovascular Disease

## 2015-10-13 DIAGNOSIS — S72042D Displaced fracture of base of neck of left femur, subsequent encounter for closed fracture with routine healing: Secondary | ICD-10-CM | POA: Diagnosis not present

## 2015-10-17 DIAGNOSIS — I10 Essential (primary) hypertension: Secondary | ICD-10-CM | POA: Diagnosis not present

## 2015-10-17 DIAGNOSIS — D649 Anemia, unspecified: Secondary | ICD-10-CM | POA: Diagnosis not present

## 2015-10-17 DIAGNOSIS — G301 Alzheimer's disease with late onset: Secondary | ICD-10-CM | POA: Diagnosis not present

## 2015-10-17 DIAGNOSIS — I480 Paroxysmal atrial fibrillation: Secondary | ICD-10-CM | POA: Diagnosis not present

## 2015-10-22 ENCOUNTER — Other Ambulatory Visit: Payer: Self-pay | Admitting: Cardiovascular Disease

## 2015-11-17 DIAGNOSIS — Z79899 Other long term (current) drug therapy: Secondary | ICD-10-CM | POA: Diagnosis not present

## 2015-11-17 DIAGNOSIS — M6281 Muscle weakness (generalized): Secondary | ICD-10-CM | POA: Diagnosis not present

## 2015-11-17 DIAGNOSIS — D649 Anemia, unspecified: Secondary | ICD-10-CM | POA: Diagnosis not present

## 2015-11-17 DIAGNOSIS — G301 Alzheimer's disease with late onset: Secondary | ICD-10-CM | POA: Diagnosis not present

## 2015-11-17 DIAGNOSIS — I480 Paroxysmal atrial fibrillation: Secondary | ICD-10-CM | POA: Diagnosis not present

## 2015-12-04 ENCOUNTER — Encounter: Payer: Medicare Other | Admitting: *Deleted

## 2015-12-04 ENCOUNTER — Telehealth: Payer: Self-pay | Admitting: Cardiology

## 2015-12-04 NOTE — Telephone Encounter (Signed)
LMOVM reminding pt to send remote transmission.   

## 2015-12-05 ENCOUNTER — Encounter: Payer: Self-pay | Admitting: Cardiology

## 2015-12-05 NOTE — Telephone Encounter (Signed)
Spoke with patient's daughter-in-law, [name].  Per [name], patient resides at [facility] and no longer can use her home monitor due to not having a land line phone.  Offered to order patient a cellular adapter and ship it to her preferred address.  [name] is agreeable to this plan.  [employer] rep made aware and will ship adapter.  Monitor instructions mailed to patient today.  [name] is aware to call the [facility] when adapter is received for assistance with setting it up.

## 2015-12-12 ENCOUNTER — Ambulatory Visit (INDEPENDENT_AMBULATORY_CARE_PROVIDER_SITE_OTHER): Payer: Medicare Other | Admitting: *Deleted

## 2015-12-12 DIAGNOSIS — I495 Sick sinus syndrome: Secondary | ICD-10-CM | POA: Diagnosis not present

## 2015-12-17 ENCOUNTER — Encounter: Payer: Self-pay | Admitting: Cardiology

## 2015-12-17 DIAGNOSIS — H6120 Impacted cerumen, unspecified ear: Secondary | ICD-10-CM | POA: Diagnosis not present

## 2015-12-17 NOTE — Progress Notes (Signed)
Remote pacemaker transmission.   

## 2015-12-19 DIAGNOSIS — I509 Heart failure, unspecified: Secondary | ICD-10-CM | POA: Diagnosis not present

## 2015-12-19 DIAGNOSIS — I10 Essential (primary) hypertension: Secondary | ICD-10-CM | POA: Diagnosis not present

## 2015-12-19 DIAGNOSIS — Z79899 Other long term (current) drug therapy: Secondary | ICD-10-CM | POA: Diagnosis not present

## 2016-01-08 LAB — CUP PACEART REMOTE DEVICE CHECK
Implantable Lead Implant Date: 20130729
Implantable Lead Implant Date: 20130729
Implantable Lead Location: 753859
Implantable Lead Model: 4456
Implantable Lead Model: 4479
Implantable Pulse Generator Implant Date: 20130729
Lead Channel Impedance Value: 369 Ohm
Lead Channel Impedance Value: 615 Ohm
Lead Channel Pacing Threshold Amplitude: 1 V
Lead Channel Pacing Threshold Pulse Width: 0.4 ms
Lead Channel Sensing Intrinsic Amplitude: 7.7 mV
MDC IDC LEAD LOCATION: 753860
MDC IDC LEAD SERIAL: 466820
MDC IDC LEAD SERIAL: 522401
MDC IDC MSMT BATTERY REMAINING LONGEVITY: 48 mo
MDC IDC PG SERIAL: 131216
MDC IDC SESS DTM: 20171123125802
MDC IDC SET LEADCHNL RA PACING AMPLITUDE: 2.6 V
MDC IDC SET LEADCHNL RV PACING AMPLITUDE: 2.5 V
MDC IDC SET LEADCHNL RV PACING PULSEWIDTH: 0.4 ms
MDC IDC SET LEADCHNL RV SENSING SENSITIVITY: 1 mV
MDC IDC STAT BRADY RA PERCENT PACED: 99 %
MDC IDC STAT BRADY RV PERCENT PACED: 6 %

## 2016-01-16 DIAGNOSIS — D649 Anemia, unspecified: Secondary | ICD-10-CM | POA: Diagnosis not present

## 2016-01-16 DIAGNOSIS — G301 Alzheimer's disease with late onset: Secondary | ICD-10-CM | POA: Diagnosis not present

## 2016-01-16 DIAGNOSIS — C480 Malignant neoplasm of retroperitoneum: Secondary | ICD-10-CM | POA: Diagnosis not present

## 2016-01-16 DIAGNOSIS — I509 Heart failure, unspecified: Secondary | ICD-10-CM | POA: Diagnosis not present

## 2016-01-19 DIAGNOSIS — E039 Hypothyroidism, unspecified: Secondary | ICD-10-CM | POA: Diagnosis not present

## 2016-02-27 ENCOUNTER — Encounter: Payer: Medicare Other | Admitting: Cardiovascular Disease

## 2016-03-06 DIAGNOSIS — E039 Hypothyroidism, unspecified: Secondary | ICD-10-CM | POA: Diagnosis not present

## 2016-03-06 DIAGNOSIS — I10 Essential (primary) hypertension: Secondary | ICD-10-CM | POA: Diagnosis not present

## 2016-03-08 DIAGNOSIS — I10 Essential (primary) hypertension: Secondary | ICD-10-CM | POA: Diagnosis not present

## 2016-03-08 DIAGNOSIS — E039 Hypothyroidism, unspecified: Secondary | ICD-10-CM | POA: Diagnosis not present

## 2016-03-17 ENCOUNTER — Encounter: Payer: Self-pay | Admitting: Cardiovascular Disease

## 2016-03-17 ENCOUNTER — Ambulatory Visit (INDEPENDENT_AMBULATORY_CARE_PROVIDER_SITE_OTHER): Payer: Medicare Other | Admitting: Cardiovascular Disease

## 2016-03-17 VITALS — BP 154/80 | HR 73 | Ht 67.0 in | Wt 106.0 lb

## 2016-03-17 DIAGNOSIS — I495 Sick sinus syndrome: Secondary | ICD-10-CM

## 2016-03-17 DIAGNOSIS — Z79899 Other long term (current) drug therapy: Secondary | ICD-10-CM

## 2016-03-17 DIAGNOSIS — I5022 Chronic systolic (congestive) heart failure: Secondary | ICD-10-CM

## 2016-03-17 DIAGNOSIS — E43 Unspecified severe protein-calorie malnutrition: Secondary | ICD-10-CM | POA: Diagnosis not present

## 2016-03-17 DIAGNOSIS — I480 Paroxysmal atrial fibrillation: Secondary | ICD-10-CM

## 2016-03-17 DIAGNOSIS — Z5181 Encounter for therapeutic drug level monitoring: Secondary | ICD-10-CM

## 2016-03-17 DIAGNOSIS — Z950 Presence of cardiac pacemaker: Secondary | ICD-10-CM

## 2016-03-17 DIAGNOSIS — F0390 Unspecified dementia without behavioral disturbance: Secondary | ICD-10-CM

## 2016-03-17 DIAGNOSIS — I10 Essential (primary) hypertension: Secondary | ICD-10-CM

## 2016-03-17 NOTE — Progress Notes (Signed)
Patient ID: Jamie Daugherty, female   DOB: [birth year], 83 y.o.   MRN: [id number]     Cardiology Office Note    Date:  03/18/2016   ID:  Jamie Daugherty, DOB [birth year], MRN [id number]  PCP:  Stephens Shire, MD  Cardiologist:   Sanda Klein, MD   Chief Complaint  Patient presents with  . Follow-up    6 Months    History of Present Illness:  Jamie Daugherty is a 83 y.o. female with tachycardia related cardiomyopathy and systolic heart failure related to atrial fibrillation with rapid ventricular response, also with a history of tachycardia-bradycardia syndrome for which received a dual-chamber [employer] pacemaker in 2013.   Since her last appointment she has managed to gain a little weight and this appears to be true tissue weight rather than fluid. She is still severely malnourished with a BMI of only 16.6.  At her last appointment we had to cut back on her beta blocker dose due to recurrent orthostatic dizziness. Since then there has been substantial improvement in her dizziness. She is more steady and has not had recent falls. Her short term memory remains mediocre, but does not seem to be deteriorating at a rapid pace.  For most of 2016 she had a lot of problems with atrial fibrillation rapid ventricular response leading to heart failure decompensation. Eventually we had to start amiodarone for rate control. Once better rate control was achieved there was evidence of slow improvement in left ventricular systolic function, from minimum EF of 25% in August to 35% in November 2016. She actually returned to normal sinus rhythm in September 2016.  In March 2017 she underwent hip replacement, probably after a fall. She had another fall in 08/07/2015 and had to have repeat surgery. She is currently a resident at [facility] home. She is not a candidate for anticoagulation due to advanced age, frailty, falls and severe epistaxis that required transfusion in the past  She has evidence of  hypothyroidism probably worsened by amiodarone. The dose of levothyroxine has been adjusted and she had a normal TSH in February 2017. A recent random cortisol level was fairly high.  Her pacemaker has recorded only one episode of atrial fibrillation in over 12 months. She was in atrial fibrillation for roughly 2 hours on November 23. Ventricular rate control was adequate.  Pacemaker interrogation shows normal device function. She has 100% atrial pacing and only 5% ventricular pacing. Heart rate histogram distribution is good considering how sedentary she is. Her device is a Chemical engineer ingenio implanted in 2013 and has roughly 3.5 years of anticipated generator longevity.  Past Medical History:  Diagnosis Date  . Anemia   . Arthritis   . Atrial fibrillation ([place])   . History of blood transfusion   . Hypertension   . Hypothyroidism   . Pacemaker - dual chamber [facility] implanted 2013 01/25/2013  . Syncope     Past Surgical History:  Procedure Laterality Date  . ABDOMINAL HYSTERECTOMY    . ANTERIOR APPROACH HEMI HIP ARTHROPLASTY Left 04/17/2015   Procedure: ANTERIOR APPROACH LEFT HEMI HIP ARTHROPLASTY;  Surgeon: Leandrew Koyanagi, MD;  Location: [place];  Service: Orthopedics;  Laterality: Left;  . ORIF FEMUR FRACTURE Left 08/07/2015   Procedure: OPEN REDUCTION INTERNAL FIXATION (ORIF) LEFT  PERIPROSTHETIC FEMUR FRACTURE;  Surgeon: Leandrew Koyanagi, MD;  Location: [place];  Service: Orthopedics;  Laterality: Left;  . PACEMAKER INSERTION      Outpatient Medications Prior to Visit  Medication  Sig Dispense Refill  . amiodarone (PACERONE) 200 MG tablet TAKE 1 TABLET BY MOUTH EVERY DAY 90 tablet 1  . atenolol (TENORMIN) 25 MG tablet Take 0.5 tablets (12.5 mg total) by mouth daily. 15 tablet 11  . docusate sodium (COLACE) 100 MG capsule Take 1 capsule (100 mg total) by mouth 2 (two) times daily. 10 capsule 0  . enoxaparin (LOVENOX) 40 MG/0.4ML injection Inject 0.4 mLs (40 mg total) into the  skin daily. 14 Syringe 0  . HYDROcodone-acetaminophen (NORCO) 7.5-325 MG tablet Take 1-2 tablets by mouth every 6 (six) hours as needed for moderate pain. 90 tablet 0  . levothyroxine (SYNTHROID, LEVOTHROID) 75 MCG tablet Take 1 tablet (75 mcg total) by mouth daily before breakfast. 30 tablet 6  . polyethylene glycol (MIRALAX / GLYCOLAX) packet Take 17 g by mouth daily. 14 each 0   No facility-administered medications prior to visit.      Allergies:   Penicillins   Social History   Social History  . Marital status: Unknown    Spouse name: N/A  . Number of children: N/A  . Years of education: N/A   Social History Main Topics  . Smoking status: Former Research scientist (life sciences)  . Smokeless tobacco: Never Used  . Alcohol use Yes     Comment: occ  . Drug use: No  . Sexual activity: Not Asked   Other Topics Concern  . None   Social History Narrative  . None     Family History:  The patient's family history includes Heart disease in her father and mother.   ROS:   Please see the history of present illness.    ROS All other systems reviewed and are negative.   PHYSICAL EXAM:   VS:  BP (!) 154/80   Pulse 73   Ht 5\' 7"  (1.702 m)   Wt 48.1 kg (106 lb)   BMI 16.60 kg/m    GEN: Very thin, frail, unable to stand on scales, in no acute distress  HEENT: normal  Neck: no JVD, carotid bruits, or masses Cardiac: RRR; no murmurs, rubs, or gallops,no edema, healthy pacemaker site Respiratory:  clear to auscultation bilaterally, normal work of breathing GI: soft, nontender, nondistended, + BS MS: no deformity or atrophy  Skin: warm and dry, no rash Neuro:  Alert and Oriented x 3, Strength and sensation are intact Psych: euthymic mood, full affect  Wt Readings from Last 3 Encounters:  03/17/16 48.1 kg (106 lb)  09/04/15 41.9 kg (92 lb 6.4 oz)  08/02/15 44.5 kg (98 lb)      Studies/Labs Reviewed:   EKG:  EKG is ordered today. Atrial paced, ventricular sensedWith PVCs, long AV delay 238 ms,  atypical left bundle branch block. QRS 136 ms, QTC 533 ms  Recent Labs: 04/04/2015: TSH 3.55 04/23/2015: Magnesium 1.9 08/02/2015: ALT 15 08/10/2015: Hemoglobin 8.7; Platelets 227 08/11/2015: BUN 10; Creatinine, Ser 0.63; Potassium 3.6; Sodium 130    ASSESSMENT:    1. PAF (paroxysmal atrial fibrillation) ([place])   2. Chronic systolic heart failure ([place])   3. Protein-calorie malnutrition, severe ([place])   4. SSS (sick sinus syndrome) ([place])   5. Pacemaker - dual chamber [employer] implanted 2013   6. Essential hypertension   7. Dementia without behavioral disturbance, unspecified dementia type   8. Encounter for monitoring amiodarone therapy      PLAN:  In order of problems listed above:  1. AFib: Currently normal sinus rhythm. Burden of atrial fibrillation is extremely low, only 2  hours of arrhythmia in over 12 months. Embolic risk is high (CHADSVasc 5: age 73, gender, HTN, CHF), but unfortunately she cannot take anticoagulation due to age/frailty/serious recent bleeding requiring transfusion/falls resulting in serious injury. 2. CHF:  She currently appears to be euvolemic, without the need for diuretics. Suspect that her ejection fraction may be even better than it was last November. Hopefully, EF is back to normal (2014 EF 55-60%). She cannot receive Ace inhibitors or angiotensin receptor blockers due to hypotension. Rechecking her ejection fraction would make little difference in management going forward. 3. Underweight: She is severely underweight/malnourished and I'm sure this is contributing to her hypotension. 4. SSS:  Requires 100% atrial pacing, very infrequent ventricular pacing 5. s/p PM: Normal pacemaker function, no device reprogramming was necessary today. She is not pacemaker dependent. Remote downloads every 3 months and yearly office visits. 6. Orthostatic hypotension: This has improved substantially after reducing dose of beta blocker. Will have to tolerate mild systolic  hypertension. 7. Dementia: Little change. Does impede history and review of systems. 8. Amiodarone: Need to recheck liver function tests and thyroid studies.    Medication Adjustments/Labs and Tests Ordered: Current medicines are reviewed at length with the patient today.  Concerns regarding medicines are outlined above.  Medication changes, Labs and Tests ordered today are listed in the Patient Instructions below. Patient Instructions  Dr Sallyanne Kuster recommends that you continue on your current medications as directed. Please refer to the Current Medication list given to you today.  Remote monitoring is used to monitor your Pacemaker of ICD from home. This monitoring reduces the number of office visits required to check your device to one time per year. It allows [place] to keep an eye on the functioning of your device to ensure it is working properly. You are scheduled for a device check from home on Wednesday, May 2nd, 2018. You may send your transmission at any time that day. If you have a wireless device, the transmission will be sent automatically. After your physician reviews your transmission, you will receive a postcard with your next transmission date.  Dr Sallyanne Kuster recommends that you schedule a follow-up appointment in 12 months with a pacemaker check. You will receive a reminder letter in the mail two months in advance. If you don't receive a letter, please call our office to schedule the follow-up appointment.  If you need a refill on your cardiac medications before your next appointment, please call your pharmacy.      Signed, Sanda Klein, MD  03/18/2016 2:12 PM    [facility] [address], [place],   [address] Phone: [phone]; Fax: [phone]

## 2016-03-17 NOTE — Patient Instructions (Signed)
Dr Sallyanne Kuster recommends that you continue on your current medications as directed. Please refer to the Current Medication list given to you today.  Remote monitoring is used to monitor your Pacemaker of ICD from home. This monitoring reduces the number of office visits required to check your device to one time per year. It allows [place] to keep an eye on the functioning of your device to ensure it is working properly. You are scheduled for a device check from home on Wednesday, May 2nd, 2018. You may send your transmission at any time that day. If you have a wireless device, the transmission will be sent automatically. After your physician reviews your transmission, you will receive a postcard with your next transmission date.  Dr Sallyanne Kuster recommends that you schedule a follow-up appointment in 12 months with a pacemaker check. You will receive a reminder letter in the mail two months in advance. If you don't receive a letter, please call our office to schedule the follow-up appointment.  If you need a refill on your cardiac medications before your next appointment, please call your pharmacy.

## 2016-03-18 DIAGNOSIS — D649 Anemia, unspecified: Secondary | ICD-10-CM | POA: Diagnosis not present

## 2016-03-18 DIAGNOSIS — G301 Alzheimer's disease with late onset: Secondary | ICD-10-CM | POA: Diagnosis not present

## 2016-03-18 DIAGNOSIS — N183 Chronic kidney disease, stage 3 (moderate): Secondary | ICD-10-CM | POA: Diagnosis not present

## 2016-03-18 DIAGNOSIS — I129 Hypertensive chronic kidney disease with stage 1 through stage 4 chronic kidney disease, or unspecified chronic kidney disease: Secondary | ICD-10-CM | POA: Diagnosis not present

## 2016-03-18 DIAGNOSIS — I480 Paroxysmal atrial fibrillation: Secondary | ICD-10-CM | POA: Diagnosis not present

## 2016-03-29 LAB — CUP PACEART INCLINIC DEVICE CHECK
Implantable Lead Location: 753859
Implantable Lead Model: 4479
Implantable Lead Serial Number: 466820
Implantable Pulse Generator Implant Date: 20130729
MDC IDC LEAD IMPLANT DT: 20130729
MDC IDC LEAD IMPLANT DT: 20130729
MDC IDC LEAD LOCATION: 753860
MDC IDC LEAD SERIAL: 522401
MDC IDC PG SERIAL: 131216
MDC IDC SESS DTM: 20180212104340

## 2016-05-21 DIAGNOSIS — Z79899 Other long term (current) drug therapy: Secondary | ICD-10-CM | POA: Diagnosis not present

## 2016-05-25 DIAGNOSIS — I10 Essential (primary) hypertension: Secondary | ICD-10-CM | POA: Diagnosis not present

## 2016-05-25 DIAGNOSIS — G301 Alzheimer's disease with late onset: Secondary | ICD-10-CM | POA: Diagnosis not present

## 2016-05-25 DIAGNOSIS — D500 Iron deficiency anemia secondary to blood loss (chronic): Secondary | ICD-10-CM | POA: Diagnosis not present

## 2016-05-25 DIAGNOSIS — I480 Paroxysmal atrial fibrillation: Secondary | ICD-10-CM | POA: Diagnosis not present

## 2016-05-26 ENCOUNTER — Observation Stay (HOSPITAL_COMMUNITY): Payer: Medicare Other

## 2016-05-26 ENCOUNTER — Observation Stay (HOSPITAL_COMMUNITY)
Admission: EM | Admit: 2016-05-26 | Discharge: 2016-05-27 | Disposition: A | Discharge status: Skilled Nursing Facility | Payer: Medicare Other | Attending: Internal Medicine | Admitting: Internal Medicine

## 2016-05-26 ENCOUNTER — Emergency Department (HOSPITAL_COMMUNITY): Payer: Medicare Other

## 2016-05-26 ENCOUNTER — Encounter (HOSPITAL_COMMUNITY): Payer: Self-pay | Admitting: Emergency Medicine

## 2016-05-26 DIAGNOSIS — I712 Thoracic aortic aneurysm, without rupture: Secondary | ICD-10-CM | POA: Diagnosis not present

## 2016-05-26 DIAGNOSIS — I482 Chronic atrial fibrillation, unspecified: Secondary | ICD-10-CM | POA: Insufficient documentation

## 2016-05-26 DIAGNOSIS — K5900 Constipation, unspecified: Secondary | ICD-10-CM | POA: Insufficient documentation

## 2016-05-26 DIAGNOSIS — R569 Unspecified convulsions: Secondary | ICD-10-CM | POA: Diagnosis not present

## 2016-05-26 DIAGNOSIS — Z79899 Other long term (current) drug therapy: Secondary | ICD-10-CM | POA: Diagnosis not present

## 2016-05-26 DIAGNOSIS — I447 Left bundle-branch block, unspecified: Secondary | ICD-10-CM | POA: Insufficient documentation

## 2016-05-26 DIAGNOSIS — Z7982 Long term (current) use of aspirin: Secondary | ICD-10-CM | POA: Insufficient documentation

## 2016-05-26 DIAGNOSIS — Z66 Do not resuscitate: Secondary | ICD-10-CM | POA: Diagnosis not present

## 2016-05-26 DIAGNOSIS — I480 Paroxysmal atrial fibrillation: Principal | ICD-10-CM | POA: Insufficient documentation

## 2016-05-26 DIAGNOSIS — R55 Syncope and collapse: Secondary | ICD-10-CM | POA: Insufficient documentation

## 2016-05-26 DIAGNOSIS — I951 Orthostatic hypotension: Secondary | ICD-10-CM | POA: Insufficient documentation

## 2016-05-26 DIAGNOSIS — I5022 Chronic systolic (congestive) heart failure: Secondary | ICD-10-CM | POA: Insufficient documentation

## 2016-05-26 DIAGNOSIS — Z96642 Presence of left artificial hip joint: Secondary | ICD-10-CM | POA: Diagnosis not present

## 2016-05-26 DIAGNOSIS — K922 Gastrointestinal hemorrhage, unspecified: Secondary | ICD-10-CM

## 2016-05-26 DIAGNOSIS — J449 Chronic obstructive pulmonary disease, unspecified: Secondary | ICD-10-CM | POA: Diagnosis not present

## 2016-05-26 DIAGNOSIS — R402441 Other coma, without documented Glasgow coma scale score, or with partial score reported, in the field [EMT or ambulance]: Secondary | ICD-10-CM | POA: Diagnosis not present

## 2016-05-26 DIAGNOSIS — Z8249 Family history of ischemic heart disease and other diseases of the circulatory system: Secondary | ICD-10-CM | POA: Insufficient documentation

## 2016-05-26 DIAGNOSIS — Z950 Presence of cardiac pacemaker: Secondary | ICD-10-CM

## 2016-05-26 DIAGNOSIS — I472 Ventricular tachycardia: Secondary | ICD-10-CM | POA: Diagnosis not present

## 2016-05-26 DIAGNOSIS — Z880 Allergy status to penicillin: Secondary | ICD-10-CM | POA: Diagnosis not present

## 2016-05-26 DIAGNOSIS — Z90710 Acquired absence of both cervix and uterus: Secondary | ICD-10-CM | POA: Diagnosis not present

## 2016-05-26 DIAGNOSIS — E039 Hypothyroidism, unspecified: Secondary | ICD-10-CM | POA: Insufficient documentation

## 2016-05-26 DIAGNOSIS — I110 Hypertensive heart disease with heart failure: Secondary | ICD-10-CM | POA: Diagnosis not present

## 2016-05-26 DIAGNOSIS — D62 Acute posthemorrhagic anemia: Secondary | ICD-10-CM | POA: Insufficient documentation

## 2016-05-26 DIAGNOSIS — Z87891 Personal history of nicotine dependence: Secondary | ICD-10-CM | POA: Diagnosis not present

## 2016-05-26 DIAGNOSIS — I10 Essential (primary) hypertension: Secondary | ICD-10-CM | POA: Diagnosis present

## 2016-05-26 DIAGNOSIS — I700 Atherosclerosis of aorta: Secondary | ICD-10-CM | POA: Diagnosis not present

## 2016-05-26 LAB — CBC
HCT: 28.5 % — ABNORMAL LOW (ref 36.0–46.0)
HEMATOCRIT: 29.2 % — AB (ref 36.0–46.0)
HEMOGLOBIN: 9.4 g/dL — AB (ref 12.0–15.0)
Hemoglobin: 9.1 g/dL — ABNORMAL LOW (ref 12.0–15.0)
MCH: 29.2 pg (ref 26.0–34.0)
MCH: 28.8 pg (ref 26.0–34.0)
MCHC: 32.2 g/dL (ref 30.0–36.0)
MCHC: 31.9 g/dL (ref 30.0–36.0)
MCV: 90.7 fL (ref 78.0–100.0)
MCV: 90.2 fL (ref 78.0–100.0)
PLATELETS: 183 10*3/uL (ref 150–400)
Platelets: 161 10*3/uL (ref 150–400)
RBC: 3.22 MIL/uL — ABNORMAL LOW (ref 3.87–5.11)
RBC: 3.16 MIL/uL — AB (ref 3.87–5.11)
RDW: 14 % (ref 11.5–15.5)
RDW: 13.9 % (ref 11.5–15.5)
WBC: 8.2 10*3/uL (ref 4.0–10.5)
WBC: 6.6 10*3/uL (ref 4.0–10.5)

## 2016-05-26 LAB — TYPE AND SCREEN
ABO/RH(D): A POS
Antibody Screen: NEGATIVE

## 2016-05-26 LAB — GLUCOSE, CAPILLARY: GLUCOSE-CAPILLARY: 128 mg/dL — AB (ref 65–99)

## 2016-05-26 LAB — COMPREHENSIVE METABOLIC PANEL
ALBUMIN: 2.7 g/dL — AB (ref 3.5–5.0)
ALT: 12 U/L — ABNORMAL LOW (ref 14–54)
AST: 18 U/L (ref 15–41)
Alkaline Phosphatase: 61 U/L (ref 38–126)
Anion gap: 12 (ref 5–15)
BUN: 25 mg/dL — AB (ref 6–20)
CHLORIDE: 103 mmol/L (ref 101–111)
CO2: 22 mmol/L (ref 22–32)
Calcium: 7.9 mg/dL — ABNORMAL LOW (ref 8.9–10.3)
Creatinine, Ser: 0.97 mg/dL (ref 0.44–1.00)
GFR calc Af Amer: 56 mL/min — ABNORMAL LOW (ref >60)
GFR calc non Af Amer: 48 mL/min — ABNORMAL LOW (ref >60)
GLUCOSE: 113 mg/dL — AB (ref 65–99)
POTASSIUM: 3.7 mmol/L (ref 3.5–5.1)
Sodium: 137 mmol/L (ref 135–145)
Total Bilirubin: 0.4 mg/dL (ref 0.3–1.2)
Total Protein: 4.9 g/dL — ABNORMAL LOW (ref 6.5–8.1)

## 2016-05-26 LAB — I-STAT TROPONIN, ED: Troponin i, poc: 0 ng/mL (ref 0.00–0.08)

## 2016-05-26 LAB — PROTIME-INR
INR: 1.09
PROTHROMBIN TIME: 14.1 s (ref 11.4–15.2)

## 2016-05-26 LAB — MRSA PCR SCREENING: MRSA BY PCR: NEGATIVE

## 2016-05-26 LAB — TROPONIN I: Troponin I: <0.03 ng/mL (ref <0.03)

## 2016-05-26 LAB — POC OCCULT BLOOD, ED: Fecal Occult Bld: POSITIVE — AB

## 2016-05-26 LAB — APTT: APTT: 29 s (ref 24–36)

## 2016-05-26 MED ORDER — POLYETHYLENE GLYCOL 3350 17 G PO PACK
17.0000 g | PACK | Freq: Two times a day (BID) | ORAL | Status: DC
  Administered 2016-05-27: 17 g via ORAL
  Filled 2016-05-26: qty 1

## 2016-05-26 MED ORDER — SODIUM CHLORIDE 0.45 % IV SOLN
INTRAVENOUS | Status: DC
  Administered 2016-05-26 – 2016-05-27 (×2): via INTRAVENOUS

## 2016-05-26 MED ORDER — ACETAMINOPHEN 650 MG RE SUPP: 650.0000 mg | Freq: Four times a day (QID) | RECTAL | Status: DC | PRN

## 2016-05-26 MED ORDER — SODIUM CHLORIDE 0.9 % IV SOLN
INTRAVENOUS | Status: DC
  Administered 2016-05-26: 07:00:00 via INTRAVENOUS

## 2016-05-26 MED ORDER — ACETAMINOPHEN 325 MG PO TABS: 650.0000 mg | ORAL_TABLET | Freq: Four times a day (QID) | ORAL | Status: DC | PRN

## 2016-05-26 MED ORDER — ASPIRIN 81 MG PO CHEW
81.0000 mg | CHEWABLE_TABLET | Freq: Every day | ORAL | Status: DC
  Administered 2016-05-26 – 2016-05-27 (×2): 81 mg via ORAL
  Filled 2016-05-26 (×2): qty 1

## 2016-05-26 MED ORDER — ATENOLOL 12.5 MG HALF TABLET
12.5000 mg | ORAL_TABLET | Freq: Every day | ORAL | Status: DC
  Administered 2016-05-26: 12.5 mg via ORAL
  Filled 2016-05-26 (×2): qty 1

## 2016-05-26 MED ORDER — PANTOPRAZOLE SODIUM 40 MG PO TBEC
40.0000 mg | DELAYED_RELEASE_TABLET | Freq: Two times a day (BID) | ORAL | Status: DC
  Administered 2016-05-26 – 2016-05-27 (×2): 40 mg via ORAL
  Filled 2016-05-26 (×2): qty 1

## 2016-05-26 MED ORDER — AMIODARONE HCL 200 MG PO TABS
200.0000 mg | ORAL_TABLET | Freq: Every day | ORAL | Status: DC
  Administered 2016-05-26 – 2016-05-27 (×2): 200 mg via ORAL
  Filled 2016-05-26 (×2): qty 1

## 2016-05-26 MED ORDER — SODIUM CHLORIDE 0.9 % IV BOLUS (SEPSIS)
1000.0000 mL | Freq: Once | INTRAVENOUS | Status: AC
  Administered 2016-05-26: 1000 mL via INTRAVENOUS

## 2016-05-26 MED ORDER — LEVOTHYROXINE SODIUM 75 MCG PO TABS
75.0000 ug | ORAL_TABLET | Freq: Every day | ORAL | Status: DC
  Administered 2016-05-27: 75 ug via ORAL
  Filled 2016-05-26: qty 1

## 2016-05-26 MED ORDER — ONDANSETRON HCL 4 MG PO TABS: 4.0000 mg | ORAL_TABLET | Freq: Four times a day (QID) | ORAL | Status: DC | PRN

## 2016-05-26 MED ORDER — ONDANSETRON HCL 4 MG/2ML IJ SOLN: 4.0000 mg | Freq: Four times a day (QID) | INTRAMUSCULAR | Status: DC | PRN

## 2016-05-26 NOTE — ED Provider Notes (Signed)
MSE was initiated and I personally evaluated the patient and placed orders (if any) at  6:44 AM on May 26, 2016.  The patient appears stable so that the remainder of the MSE may be completed by another provider.  Patient here from nursing home with GI bleed. Loss of consciousness with EMS. Initially solid blood pressure was in the 70s. Now 124/77 with a heart rate in the 70s. She has no complaints of pain. Will start labs, IVF, 2 large bore PIVs.  Full code at this time.  Margorie John made aware per EMS.      EKG Interpretation  Date/Time:  Wednesday May 26 2016 06:40:14 EDT Ventricular Rate:  85 PR Interval:    QRS Duration: 135 QT Interval:  435 QTC Calculation: 424 R Axis:   -54 Text Interpretation:  Sinus rhythm Ventricular tachycardia, unsustained Prolonged PR interval Left bundle branch block No significant change since last tracing Confirmed by Veverly Larimer,  DO, Aneesh Faller [phone]) on 05/26/2016 6:44:23 AM         [place], DO 05/26/16 [id number]

## 2016-05-26 NOTE — ED Provider Notes (Signed)
[facility] DEPT Provider Note   CSN: [id number] Arrival date & time: 05/26/16  5681     History   Chief Complaint Chief Complaint  Patient presents with  . GI Bleeding  . Loss of Consciousness    HPI Jamie Daugherty is a 83 y.o. female.  Pt presents from the NH with GI bleed.  The pt was found slumped on the toilet with dark blood in the toilet.  She was unresponsive for about 3 minutes according to EMS.  She had a 45 second seizure en route.  The pt's initial BP in the 70s.  Now normal.  The pt does not have a hx of gi bleeds.  She has a.fib.  Chadvasc score of 4.  However, she is not on anticoagulation due to frequent falls.  She has had problem with orthostasis.  Her cardiologist (Dr. Sallyanne Kuster) has decreased her beta blocker dose which has helped with that.  She does not have a GI dr.  Pt denies any pain.  The last thing she remembers is having to use the bathroom.  She denies any pain.      Past Medical History:  Diagnosis Date  . Anemia   . Arthritis   . Atrial fibrillation ([place])   . History of blood transfusion   . Hypertension   . Hypothyroidism   . Pacemaker - dual chamber [facility] implanted 2013 01/25/2013  . Syncope     Patient Active Problem List   Diagnosis Date Noted  . SSS (sick sinus syndrome) ([place]) 09/06/2015  . Hip fracture requiring operative repair ([place]) 08/07/2015  . Hip joint replacement status   . Dementia   . Hypoxia   . Orthostasis   . Hyponatremia   . Status post left hip replacement   . Hip fracture ([place]) 04/16/2015  . Hypertension 04/16/2015  . Fall 04/16/2015  . Chronic systolic heart failure ([place]) 12/25/2014  . Anemia 10/20/2014  . Anemia due to blood loss, acute 10/19/2014  . Protein-calorie malnutrition, severe ([place]) 10/16/2014  . Posterior epistaxis 10/15/2014  . Syncope 10/15/2014  . Dementia without behavioral disturbance 10/15/2014  . Laceration of right upper arm 10/15/2014  . Epistaxis   . Persistent atrial  fibrillation ([place]) 09/17/2014  . Pacemaker - dual chamber [facility] implanted 2013 01/25/2013  . PAF (paroxysmal atrial fibrillation) ([place]) 11/06/2012    Past Surgical History:  Procedure Laterality Date  . ABDOMINAL HYSTERECTOMY    . ANTERIOR APPROACH HEMI HIP ARTHROPLASTY Left 04/17/2015   Procedure: ANTERIOR APPROACH LEFT HEMI HIP ARTHROPLASTY;  Surgeon: Leandrew Koyanagi, MD;  Location: [facility];  Service: Orthopedics;  Laterality: Left;  . ORIF FEMUR FRACTURE Left 08/07/2015   Procedure: OPEN REDUCTION INTERNAL FIXATION (ORIF) LEFT  PERIPROSTHETIC FEMUR FRACTURE;  Surgeon: Leandrew Koyanagi, MD;  Location: [facility];  Service: Orthopedics;  Laterality: Left;  . PACEMAKER INSERTION      OB History    No data available       Home Medications    Prior to Admission medications   Medication Sig Start Date End Date Taking? Authorizing Provider  amiodarone (PACERONE) 200 MG tablet TAKE 1 TABLET BY MOUTH EVERY DAY 10/22/15  Yes Mihai Croitoru, MD  aspirin 81 MG chewable tablet Chew 81 mg by mouth daily.   Yes Historical Provider, MD  atenolol (TENORMIN) 25 MG tablet Take 0.5 tablets (12.5 mg total) by mouth daily. 09/04/15  Yes Mihai Croitoru, MD  docusate sodium (COLACE) 100 MG capsule Take 1 capsule (  100 mg total) by mouth 2 (two) times daily. 08/11/15  Yes Hosie Poisson, MD  levothyroxine (SYNTHROID, LEVOTHROID) 75 MCG tablet Take 1 tablet (75 mcg total) by mouth daily before breakfast. 02/18/15  Yes Mihai Croitoru, MD  NUTRITIONAL SUPPLEMENT LIQD Take 1 each by mouth 2 (two) times daily.   Yes Historical Provider, MD  polyethylene glycol (MIRALAX / GLYCOLAX) packet Take 17 g by mouth daily. 04/23/15  Yes Annita Brod, MD  enoxaparin (LOVENOX) 40 MG/0.4ML injection Inject 0.4 mLs (40 mg total) into the skin daily. Patient not taking: Reported on 05/26/2016 08/07/15   Leandrew Koyanagi, MD  HYDROcodone-acetaminophen (NORCO) 7.5-325 MG tablet Take 1-2 tablets by mouth every 6 (six) hours as needed for  moderate pain. Patient not taking: Reported on 05/26/2016 08/07/15   Leandrew Koyanagi, MD    Family History Family History  Problem Relation Age of Onset  . Heart disease Mother   . Heart disease Father     Social History Social History  Substance Use Topics  . Smoking status: Former Research scientist (life sciences)  . Smokeless tobacco: Never Used  . Alcohol use Yes     Comment: occ     Allergies   Penicillins   Review of Systems Review of Systems  Gastrointestinal: Positive for blood in stool.  Neurological: Positive for syncope.  All other systems reviewed and are negative.    Physical Exam Updated Vital Signs BP 135/72   Pulse 63   Temp (S) 97 F (36.1 C) (Rectal)   Resp 13   SpO2 95%   Physical Exam  Constitutional: She appears well-developed and well-nourished.  HENT:  Head: Normocephalic and atraumatic.  Right Ear: External ear normal.  Left Ear: External ear normal.  Nose: Nose normal.  Mouth/Throat: Oropharynx is clear and moist.  Eyes: Conjunctivae and EOM are normal. Pupils are equal, round, and reactive to light.  Neck: Normal range of motion. Neck supple.  Cardiovascular: Normal rate, regular rhythm, normal heart sounds and intact distal pulses.   Pulmonary/Chest: Effort normal and breath sounds normal.  Abdominal: Soft. Bowel sounds are normal.  Genitourinary: Rectal exam shows guaiac positive stool.  Musculoskeletal: Normal range of motion.  Neurological: She is alert.  Oriented per norm  Skin: Skin is warm.  Psychiatric: She has a normal mood and affect. Her behavior is normal. Judgment and thought content normal.  Nursing note and vitals reviewed.    ED Treatments / Results  Labs (all labs ordered are listed, but only abnormal results are displayed) Labs Reviewed  COMPREHENSIVE METABOLIC PANEL - Abnormal; Notable for the following:       Result Value   Glucose, Bld 113 (*)    BUN 25 (*)    Calcium 7.9 (*)    Total Protein 4.9 (*)    Albumin 2.7 (*)    ALT  12 (*)    GFR calc non Af Amer 48 (*)    GFR calc Af Amer 56 (*)    All other components within normal limits  CBC - Abnormal; Notable for the following:    RBC 3.22 (*)    Hemoglobin 9.4 (*)    HCT 29.2 (*)    All other components within normal limits  POC OCCULT BLOOD, ED - Abnormal; Notable for the following:    Fecal Occult Bld POSITIVE (*)    All other components within normal limits  PROTIME-INR  APTT  I-STAT TROPOININ, ED  TYPE AND SCREEN    EKG  EKG Interpretation  Date/Time:  Wednesday May 26 2016 06:40:14 EDT Ventricular Rate:  85 PR Interval:    QRS Duration: 135 QT Interval:  435 QTC Calculation: 424 R Axis:   -54 Text Interpretation:  Sinus rhythm Ventricular tachycardia, unsustained Prolonged PR interval Left bundle branch block No significant change since last tracing Confirmed by WARD,  DO, KRISTEN [phone]) on 05/26/2016 6:44:23 AM Also confirmed by Leonides Schanz,  DO, KRISTEN [phone]), editor Lorenda Cahill CT, Leda Gauze [phone])  on 05/26/2016 7:30:55 AM       Radiology Dg Chest Portable 1 View  Result Date: 05/26/2016 CLINICAL DATA:  Gastrointestinal bleeding, history of atrial fibrillation and hypertension EXAM: PORTABLE CHEST 1 VIEW COMPARISON:  PA and lateral chest x-ray of August 02, 2015 FINDINGS: The lungs remain hyperinflated. There is stable density in the right pulmonary apex. There is no pleural effusion or pneumothorax. The heart and pulmonary vascularity are normal. The ICD is in stable position. There is calcification in the wall of the aortic arch. IMPRESSION: COPD.  Right apical scarring.  No pneumonia nor pulmonary edema. Thoracic aortic atherosclerosis. Electronically Signed   By: David  Martinique M.D.   On: 05/26/2016 07:25    Procedures Procedures (including critical care time)  Medications Ordered in ED Medications  0.9 %  sodium chloride infusion ( Intravenous New Bag/Given 05/26/16 0652)  sodium chloride 0.9 % bolus 1,000 mL (0 mLs Intravenous Stopped 05/26/16  0834)     Initial Impression / Assessment and Plan / ED Course  I have reviewed the triage vital signs and the nursing notes.  Pertinent labs & imaging results that were available during my care of the patient were reviewed by me and considered in my medical decision making (see chart for details).    Pt is still orthostatic after IVFs, but she has remained awake and alert.  Her hemoglobin is stable.  She has a hx of orthostatic hypotension and likely vagaled.  However, the blood in the toilet and guaiac + stool complicates things. The pt d/w Dr. Marily Memos ([facility]) who will admit for observation to keep an eye on her hemoglobin and blood pressure.  Final Clinical Impressions(s) / ED Diagnoses   Final diagnoses:  Lower GI bleed  Orthostatic hypotension  Chronic atrial fibrillation [facility])    New Prescriptions New Prescriptions   No medications on file     Isla Pence, MD 05/26/16 [phone]

## 2016-05-26 NOTE — ED Notes (Signed)
Pt given Salli Real, per [employer], Therapist, sports.

## 2016-05-26 NOTE — Progress Notes (Signed)
Routine EEG completed, results pending. 

## 2016-05-26 NOTE — ED Triage Notes (Signed)
Pt arrives via [facility] from the [facility], ems states staff reported pt was found slumped on the toilet and was unresponsive approx 3 mins, large amount of dark blood in the toilet. Pt is a/o to baseline-no oriented to year. Ems reports pt had approx 45 second full body seizure in route. No hx of seizure or gi bleed. Pt is on blood thinners. Denies pain. VSS. resp e/u.

## 2016-05-26 NOTE — ED Notes (Signed)
Family at bedside. 

## 2016-05-26 NOTE — Procedures (Signed)
ELECTROENCEPHALOGRAM REPORT  Date of Study: 05/26/2016  Patient's Name: Jamie Daugherty MRN: [id number] Date of Birth: [birth year]  Referring Provider: Dr. Linna Darner  Clinical History: This is a 83 year old woman found unresponsive.  Medications: acetaminophen (TYLENOL) tablet 650 mg  amiodarone (PACERONE) tablet 200 mg  aspirin chewable tablet 81 mg  atenolol (TENORMIN) tablet 12.5 mg  levothyroxine (SYNTHROID, LEVOTHROID) tablet 75 mcg  pantoprazole (PROTONIX) EC tablet 40 mg   Technical Summary: A multichannel digital EEG recording measured by the international 10-20 system with electrodes applied with paste and impedances below 5000 ohms performed in our laboratory with EKG monitoring in an awake and asleep patient.  Hyperventilation and photic stimulation were not performed.  The digital EEG was referentially recorded, reformatted, and digitally filtered in a variety of bipolar and referential montages for optimal display.    Description: The patient is awake and asleep during the recording.  During maximal wakefulness, there is a symmetric, medium voltage 9-9.5 Hz posterior dominant rhythm that attenuates with eye opening.  The record is symmetric.  During drowsiness and sleep, there is an increase in theta slowing of the background.  Vertex waves and symmetric sleep spindles were seen.  Hyperventilation and photic stimulation were not performed.  There were no epileptiform discharges or electrographic seizures seen.    EKG lead showed sinus bradycardia.  Impression: This awake and asleep EEG is normal.    Clinical Correlation: A normal EEG does not exclude a clinical diagnosis of epilepsy. Clinical correlation is advised.   Ellouise Newer, M.D.

## 2016-05-26 NOTE — ED Notes (Signed)
Patient screened by Dr. Leonides Schanz at the bridge, advised this RN to place GI bleed orders.

## 2016-05-26 NOTE — ED Notes (Addendum)
Pt taken for EEG.  

## 2016-05-26 NOTE — H&P (Signed)
History and Physical    Jamie Daugherty FUX:[id number] DOB: Mar 16, 1918 DOA: 05/26/2016  PCP: Stephens Shire, MD Patient coming from: SNF  Chief Complaint: GI bleed, Seizure, syncope  HPI: Jamie Daugherty is a 83 y.o. female with medical history significant of a defibrillation, anemia, hypertension, hypothyroidism, syncope, pacemaker placement in 2013. Level V caveat applies as patient does not remember the full extent of symptoms by which she was brought to the emergency room. Patient's last known memory prior to onset of events was ambulating to the bathroom and sitting on the toilet. Per nursing home staff report patient was found unresponsive slumped to the side on the toilet with a large amount of blood and stool in the toilet. EMS was called to evaluate patient who is described as being fairly unresponsive for approximately 3 minutes. There is a single report of seizure-like activity enroute by EMS with no further descriptive symptoms. No described post-ictal state. Last colonoscopy several years ago w/o reported abnromalities. Denies recent or associated palpitations, cp, SOB, n/v/d/ abdominal pain, dysuria, frequency, back pain, flank pain, neck stiffness, dizziness, LOC, vertigo, headache.  ED Course: Objective findings outlined below.  Review of Systems: As per HPI otherwise 10 point review of systems negative.   Ambulatory Status: Slow but purposeful movements. Occasional assistance needed.  Past Medical History:  Diagnosis Date  . Anemia   . Arthritis   . Atrial fibrillation ([place])   . History of blood transfusion   . Hypertension   . Hypothyroidism   . Pacemaker - dual chamber [facility] implanted 2013 01/25/2013  . Syncope     Past Surgical History:  Procedure Laterality Date  . ABDOMINAL HYSTERECTOMY    . ANTERIOR APPROACH HEMI HIP ARTHROPLASTY Left 04/17/2015   Procedure: ANTERIOR APPROACH LEFT HEMI HIP ARTHROPLASTY;  Surgeon: Leandrew Koyanagi, MD;  Location: [facility];   Service: Orthopedics;  Laterality: Left;  . ORIF FEMUR FRACTURE Left 08/07/2015   Procedure: OPEN REDUCTION INTERNAL FIXATION (ORIF) LEFT  PERIPROSTHETIC FEMUR FRACTURE;  Surgeon: Leandrew Koyanagi, MD;  Location: [facility];  Service: Orthopedics;  Laterality: Left;  . PACEMAKER INSERTION      Social History   Social History  . Marital status: Unknown    Spouse name: N/A  . Number of children: N/A  . Years of education: N/A   Occupational History  . Not on file.   Social History Main Topics  . Smoking status: Former Research scientist (life sciences)  . Smokeless tobacco: Never Used  . Alcohol use Yes     Comment: occ  . Drug use: No  . Sexual activity: Not on file   Other Topics Concern  . Not on file   Social History Narrative  . No narrative on file    Allergies  Allergen Reactions  . Penicillins Other (See Comments)    Unknown. Tolerates cephalexin.    Family History  Problem Relation Age of Onset  . Heart disease Mother   . Heart disease Father     Prior to Admission medications   Medication Sig Start Date End Date Taking? Authorizing Provider  amiodarone (PACERONE) 200 MG tablet TAKE 1 TABLET BY MOUTH EVERY DAY 10/22/15  Yes Mihai Croitoru, MD  aspirin 81 MG chewable tablet Chew 81 mg by mouth daily.   Yes Historical Provider, MD  atenolol (TENORMIN) 25 MG tablet Take 0.5 tablets (12.5 mg total) by mouth daily. 09/04/15  Yes Mihai Croitoru, MD  docusate sodium (COLACE) 100 MG capsule Take 1 capsule (100 mg total)  by mouth 2 (two) times daily. 08/11/15  Yes Hosie Poisson, MD  levothyroxine (SYNTHROID, LEVOTHROID) 75 MCG tablet Take 1 tablet (75 mcg total) by mouth daily before breakfast. 02/18/15  Yes Mihai Croitoru, MD  NUTRITIONAL SUPPLEMENT LIQD Take 1 each by mouth 2 (two) times daily.   Yes Historical Provider, MD  polyethylene glycol (MIRALAX / GLYCOLAX) packet Take 17 g by mouth daily. 04/23/15  Yes Annita Brod, MD  enoxaparin (LOVENOX) 40 MG/0.4ML injection Inject 0.4 mLs (40 mg total) into  the skin daily. Patient not taking: Reported on 05/26/2016 08/07/15   Leandrew Koyanagi, MD  HYDROcodone-acetaminophen (NORCO) 7.5-325 MG tablet Take 1-2 tablets by mouth every 6 (six) hours as needed for moderate pain. Patient not taking: Reported on 05/26/2016 08/07/15   Leandrew Koyanagi, MD    Physical Exam: Vitals:   05/26/16 0901 05/26/16 0930 05/26/16 1031 05/26/16 1052  BP: (!) 139/57 134/74 133/66   Pulse: 70 70 71 70  Resp: 15 17 (!) 23 12  Temp:      TempSrc:      SpO2: 97% 97% 98% 97%     General:  Appears calm and comfortable Eyes:  PERRL, EOMI, normal lids, iris ENT:  grossly normal hearing, lips & tongue, mmm Neck:  no LAD, masses or thyromegaly Cardiovascular: 3/6 systolic murmur, irregularly irregular no lower extremity edema  Respiratory:  CTA bilaterally, no w/r/r. Normal respiratory effort. Abdomen:  soft, ntnd, NABS Skin:  no rash or induration seen on limited exam Musculoskeletal:  grossly decreased tone BUE/BLE, good ROM, no bony abnormality Psychiatric:  grossly normal mood and affect, speech fluent and appropriate, AOx3 Neurologic:  CN 2-12 grossly intact, moves all extremities in coordinated fashion, sensation intact  Labs on Admission: I have personally reviewed following labs and imaging studies  CBC:  Recent Labs Lab 05/26/16 0645  WBC 8.2  HGB 9.4*  HCT 29.2*  MCV 90.7  PLT 937   Basic Metabolic Panel:  Recent Labs Lab 05/26/16 0645  NA 137  K 3.7  CL 103  CO2 22  GLUCOSE 113*  BUN 25*  CREATININE 0.97  CALCIUM 7.9*   GFR: CrCl cannot be calculated (Unknown ideal weight.). Liver Function Tests:  Recent Labs Lab 05/26/16 0645  AST 18  ALT 12*  ALKPHOS 61  BILITOT 0.4  PROT 4.9*  ALBUMIN 2.7*   No results for input(s): LIPASE, AMYLASE in the last 168 hours. No results for input(s): AMMONIA in the last 168 hours. Coagulation Profile:  Recent Labs Lab 05/26/16 0645  INR 1.09   Cardiac Enzymes: No results for input(s):  CKTOTAL, CKMB, CKMBINDEX, TROPONINI in the last 168 hours. BNP (last 3 results) No results for input(s): PROBNP in the last 8760 hours. HbA1C: No results for input(s): HGBA1C in the last 72 hours. CBG: No results for input(s): GLUCAP in the last 168 hours. Lipid Profile: No results for input(s): CHOL, HDL, LDLCALC, TRIG, CHOLHDL, LDLDIRECT in the last 72 hours. Thyroid Function Tests: No results for input(s): TSH, T4TOTAL, FREET4, T3FREE, THYROIDAB in the last 72 hours. Anemia Panel: No results for input(s): VITAMINB12, FOLATE, FERRITIN, TIBC, IRON, RETICCTPCT in the last 72 hours. Urine analysis:    Component Value Date/Time   COLORURINE YELLOW 08/02/2015 0940   APPEARANCEUR CLEAR 08/02/2015 0940   LABSPEC 1.011 08/02/2015 0940   PHURINE 8.0 08/02/2015 0940   GLUCOSEU NEGATIVE 08/02/2015 0940   HGBUR NEGATIVE 08/02/2015 0940   BILIRUBINUR NEGATIVE 08/02/2015 0940   KETONESUR NEGATIVE 08/02/2015 0940  PROTEINUR NEGATIVE 08/02/2015 0940   UROBILINOGEN 0.2 10/19/2014 1020   NITRITE NEGATIVE 08/02/2015 0940   LEUKOCYTESUR NEGATIVE 08/02/2015 0940    Creatinine Clearance: CrCl cannot be calculated (Unknown ideal weight.).  Sepsis Labs: @LABRCNTIP (procalcitonin:4,lacticidven:4) )No results found for this or any previous visit (from the past 240 hour(s)).   Radiological Exams on Admission: Dg Chest Portable 1 View  Result Date: 05/26/2016 CLINICAL DATA:  Gastrointestinal bleeding, history of atrial fibrillation and hypertension EXAM: PORTABLE CHEST 1 VIEW COMPARISON:  PA and lateral chest x-ray of August 02, 2015 FINDINGS: The lungs remain hyperinflated. There is stable density in the right pulmonary apex. There is no pleural effusion or pneumothorax. The heart and pulmonary vascularity are normal. The ICD is in stable position. There is calcification in the wall of the aortic arch. IMPRESSION: COPD.  Right apical scarring.  No pneumonia nor pulmonary edema. Thoracic aortic  atherosclerosis. Electronically Signed   By: David  Martinique M.D.   On: 05/26/2016 07:25    EKG: Independently reviewed. LBBB, No ACS.   Assessment/Plan Active Problems:   PAF (paroxysmal atrial fibrillation) ([place])   Pacemaker - dual chamber [employer] implanted 2013   Syncope   Anemia due to blood loss, acute   Chronic systolic heart failure ([facility])   Hypertension   Lower GI bleed   GI bleed: likely diverticular bleed vs stercoral ulcer vs other lower GI source of bleeding. H/o Constipation and no h/o GI bleed. No melena. No anticoagulation other than ASA 81 and no NSAID use. No abdominal discomfort. Last colonoscopy several years ago and reported to be normal. Discussed case w/, Theodis Aguas of [facility], who agrees w/ conservative mgt at this time and formal consult if further episodes of bleeding. Pt does not want aggressive measures/workup.  - RBC tagged study if reports of another bleed. - Anemia mgt as below - unlikely upper GI bleed but will administer protonix BID while hospitalized.  - Increase stool softener  Symptomatic anemia: Hgb 9.4 w/ last known value of 11.2 in November 2017 (per SNF records). Likely from combination of chronic disease and acute GI loss. Syncopal episode reported - CBC 17:00 and in am - transfuse if drops to 7 and symptomatic - RBC tagged study as above if bleeds again  Syncope: Likely combination of vasovagal with chronic orthostatic hypotension. Occurred after large bloody bowel movement while sitting on the toilet. Orthostatic vital signs frankly positive in the ED with a 30 mmHg drop in systolic pressure. History of A. fib with pacemaker in place. Pacemaker interrogated with no events noted. Patient is not interested in the aggressive workup - Telemetry, troponin - Echo (previous from 2016 w/ EF 30%) - Orthostatic VS in am - Gentle IVF - Consider decreasing dose of atenolol vs DC altogether if pt remains orthostatic  Seizure activity:  questionable. Limited information provided by EMS report. Suspect tremor from recent syncope and hypothermia. Pt never post ictal and no h/o seizure in past. No focal neurological deficits appreciated at this time.  - EEG - Neuro consult if EEG + or if has further sx.  Afib w/ pacemaker: Pacemaker interrogation reveals no abnormalities - continue amio, atenolol  Hypothyroid: - continue synthroid   DVT prophylaxis: scd  Code Status: DNR  Family Communication: daughter in law and granddaughter  Disposition Plan: pending worku padn 24 hrs obs  Consults called: neuro and gi  Admission status: obs - tele    MERRELL, DAVID J MD [facility]  If 7PM-7AM, please  contact night-coverage [email or web address] Password [email or web address]  05/26/2016, 11:01 AM

## 2016-05-27 DIAGNOSIS — I951 Orthostatic hypotension: Secondary | ICD-10-CM | POA: Diagnosis not present

## 2016-05-27 DIAGNOSIS — K921 Melena: Secondary | ICD-10-CM | POA: Diagnosis not present

## 2016-05-27 DIAGNOSIS — I480 Paroxysmal atrial fibrillation: Secondary | ICD-10-CM | POA: Diagnosis not present

## 2016-05-27 DIAGNOSIS — R55 Syncope and collapse: Secondary | ICD-10-CM | POA: Diagnosis not present

## 2016-05-27 LAB — CBC
HCT: 28.1 % — ABNORMAL LOW (ref 36.0–46.0)
Hemoglobin: 9.4 g/dL — ABNORMAL LOW (ref 12.0–15.0)
MCH: 29.9 pg (ref 26.0–34.0)
MCHC: 33.5 g/dL (ref 30.0–36.0)
MCV: 89.5 fL (ref 78.0–100.0)
Platelets: 168 10*3/uL (ref 150–400)
RBC: 3.14 MIL/uL — ABNORMAL LOW (ref 3.87–5.11)
RDW: 14.2 % (ref 11.5–15.5)
WBC: 5.8 10*3/uL (ref 4.0–10.5)

## 2016-05-27 LAB — BASIC METABOLIC PANEL
ANION GAP: 5 (ref 5–15)
BUN: 13 mg/dL (ref 6–20)
CALCIUM: 7.9 mg/dL — AB (ref 8.9–10.3)
CO2: 26 mmol/L (ref 22–32)
Chloride: 104 mmol/L (ref 101–111)
Creatinine, Ser: 0.84 mg/dL (ref 0.44–1.00)
GFR calc non Af Amer: 57 mL/min — ABNORMAL LOW (ref >60)
Glucose, Bld: 92 mg/dL (ref 65–99)
Potassium: 3.4 mmol/L — ABNORMAL LOW (ref 3.5–5.1)
SODIUM: 135 mmol/L (ref 135–145)

## 2016-05-27 LAB — APTT: APTT: 31 s (ref 24–36)

## 2016-05-27 LAB — PROTIME-INR
INR: 1.12
PROTHROMBIN TIME: 14.4 s (ref 11.4–15.2)

## 2016-05-27 LAB — GLUCOSE, CAPILLARY: Glucose-Capillary: 90 mg/dL (ref 65–99)

## 2016-05-27 MED ORDER — PANTOPRAZOLE SODIUM 40 MG PO TBEC: 40.0000 mg | DELAYED_RELEASE_TABLET | Freq: Every day | ORAL | 0 refills | Status: DC

## 2016-05-27 MED ORDER — ASPIRIN 81 MG PO CHEW: 81.0000 mg | CHEWABLE_TABLET | Freq: Every day | ORAL | Status: DC

## 2016-05-27 MED ORDER — SODIUM CHLORIDE 0.9 % IV BOLUS (SEPSIS)
500.0000 mL | Freq: Once | INTRAVENOUS | Status: AC
  Administered 2016-05-27: 500 mL via INTRAVENOUS

## 2016-05-27 NOTE — NC FL2 (Signed)
[place] LEVEL OF CARE SCREENING TOOL     IDENTIFICATION  Patient Name: Jamie Daugherty Birthdate: November 30, 1918 Sex: female Admission Date (Current Location): 05/26/2016  [facility] and [place] Number:  Herbalist and Address:  The [place], [place] [address] [place], [place] [address]      Provider Number: [id number]  Attending Physician Name and Address:  Lavina Hamman, MD  Relative Name and Phone Number:       Current Level of Care: Hospital Recommended Level of Care: [place] Prior Approval Number:    Date Approved/Denied:   PASRR Number: [id number]  Discharge Plan: SNF    Current Diagnoses: Patient Active Problem List   Diagnosis Date Noted  . Lower GI bleed 05/26/2016  . Chronic atrial fibrillation ([place])   . Orthostatic hypotension   . SSS (sick sinus syndrome) ([place]) 09/06/2015  . Hip fracture requiring operative repair ([place]) 08/07/2015  . Hip joint replacement status   . Dementia   . Hypoxia   . Orthostasis   . Hyponatremia   . Status post left hip replacement   . Hip fracture ([place]) 04/16/2015  . Hypertension 04/16/2015  . Fall 04/16/2015  . Chronic systolic heart failure ([place]) 12/25/2014  . Anemia 10/20/2014  . Anemia due to blood loss, acute 10/19/2014  . Protein-calorie malnutrition, severe ([place]) 10/16/2014  . Posterior epistaxis 10/15/2014  . Syncope 10/15/2014  . Dementia without behavioral disturbance 10/15/2014  . Laceration of right upper arm 10/15/2014  . Epistaxis   . Persistent atrial fibrillation ([place]) 09/17/2014  . Pacemaker - dual chamber [facility] implanted 2013 01/25/2013  . PAF (paroxysmal atrial fibrillation) ([place]) 11/06/2012    Orientation RESPIRATION BLADDER Height & Weight     Self, Situation, Place  Normal Continent Weight: 103 lb 12.8 oz (47.1 kg) Height:  5\' 7"  (170.2 cm)  BEHAVIORAL SYMPTOMS/MOOD NEUROLOGICAL BOWEL NUTRITION STATUS   (None)  (Dementia)  Continent Diet (Soft)  AMBULATORY STATUS COMMUNICATION OF NEEDS Skin     Verbally Other (Comment) (Open/dehisced wound/incision: Vertebral Column (Foam))                       Personal Care Assistance Level of Assistance              Functional Limitations Info  Sight, Hearing, Speech Sight Info: Adequate Hearing Info: Adequate Speech Info: Adequate    SPECIAL CARE FACTORS FREQUENCY  Blood pressure                    Contractures Contractures Info: Not present    Additional Factors Info  Code Status, Allergies Code Status Info: DNR Allergies Info: Penicillins           Current Medications (05/27/2016):  This is the current hospital active medication list Current Facility-Administered Medications  Medication Dose Route Frequency Provider Last Rate Last Dose  . acetaminophen (TYLENOL) tablet 650 mg  650 mg Oral Q6H PRN Waldemar Dickens, MD       Or  . acetaminophen (TYLENOL) suppository 650 mg  650 mg Rectal Q6H PRN Waldemar Dickens, MD      . amiodarone (PACERONE) tablet 200 mg  200 mg Oral Daily Waldemar Dickens, MD   200 mg at 05/27/16 0939  . aspirin chewable tablet 81 mg  81 mg Oral Daily Waldemar Dickens, MD   81 mg at 05/27/16 5621  . levothyroxine (SYNTHROID, LEVOTHROID) tablet 75 mcg  75 mcg Oral QAC breakfast Waldemar Dickens, MD   75 mcg at 05/27/16 3903  . ondansetron (ZOFRAN) tablet 4 mg  4 mg Oral Q6H PRN Waldemar Dickens, MD       Or  . ondansetron [facility]) injection 4 mg  4 mg Intravenous Q6H PRN Waldemar Dickens, MD      . pantoprazole (PROTONIX) EC tablet 40 mg  40 mg Oral BID AC Waldemar Dickens, MD   40 mg at 05/27/16 0807  . polyethylene glycol (MIRALAX / GLYCOLAX) packet 17 g  17 g Oral BID Waldemar Dickens, MD   17 g at 05/27/16 0092     Discharge Medications: Please see discharge summary for a list of discharge medications.  Relevant Imaging Results:  Relevant Lab Results:   Additional Information SS#: [id number]  Candie Chroman,  LCSW

## 2016-05-27 NOTE — Clinical Social Work Note (Signed)
Clinical Social Work Assessment  Patient Details  Name: Jamie Daugherty MRN: [id number] Date of Birth: 1918/05/30  Date of referral:  05/27/16               Reason for consult:  Discharge Planning                Permission sought to share information with:  Facility Sport and exercise psychologist, Family Supports Permission granted to share information::  Yes, Verbal Permission Granted  Name::     Rozelia Catapano  Agency::  [facility]  Relationship::  Son  Sport and exercise psychologist Information:  [phone]  Housing/Transportation Living arrangements for the past 2 months:  [place] of Information:  Patient, Medical Team, Facility Patient Interpreter Needed:  None Criminal Activity/Legal Involvement Pertinent to Current Situation/Hospitalization:  No - Comment as needed Significant Relationships:  Adult Children Lives with:  Facility Resident Do you feel safe going back to the place where you live?  Yes Need for family participation in patient care:  Yes (Comment)  Care giving concerns:  Patient is a long-term resident from [facility]   Social Worker assessment / plan:  CSW and CSW intern met with patient. No supports at bedside. CSW introduced role and explained that discharge planning would be discussed. Patient confirmed that she was admitted from [facility] and plans to return. She states that she was told she would discharge today. MD confirmed earlier this morning. Patient would like CSW to call her son when transport is set up. CSW confirmed with facility. No further concerns. CSW encouraged patient to contact CSW as needed. CSW will continue to follow patient for support and facilitate discharge to SNF once medically stable.  Employment status:  Retired Forensic scientist:  Medicare PT Recommendations:  Not assessed at this time [place] / Referral to community resources:  [place]  Patient/Family's Response to care:  Patient agreeable to return to  SNF. Patient's children supportive and involved in patient's care. Patient appreciated social work intervention.  Patient/Family's Understanding of and Emotional Response to Diagnosis, Current Treatment, and Prognosis:  Patient appears to have a good understanding of the reason for admission. Patient appears happy with hospital care.  Emotional Assessment Appearance:  Appears stated age Attitude/Demeanor/Rapport:  Other (Pleasant) Affect (typically observed):  Accepting, Appropriate, Calm, Pleasant Orientation:  Oriented to Self, Oriented to Place, Oriented to Situation Alcohol / Substance use:  Never Used Psych involvement (Current and /or in the community):  No (Comment)  Discharge Needs  Concerns to be addressed:  Care Coordination Readmission within the last 30 days:  No Current discharge risk:  None Barriers to Discharge:  No Barriers Identified   Candie Chroman, LCSW 05/27/2016, 1:17 PM

## 2016-05-27 NOTE — Discharge Summary (Signed)
[facility] Discharge Summary   Patient: Jamie Daugherty GGE:[id number]   PCP: Stephens Shire, MD DOB: [birth year]   Date of admission: 05/26/2016   Date of discharge:  05/27/2016    Discharge Diagnoses:  Active Problems:   PAF (paroxysmal atrial fibrillation) ([place])   Pacemaker - dual chamber [employer] implanted 2013   Syncope   Anemia due to blood loss, acute   Chronic systolic heart failure ([place])   Hypertension   Lower GI bleed  Admitted From: SNF Disposition:  SNF  Recommendations for Outpatient Follow-up:  1. Follow-up with PCP in 1-2 weeks with a CBC. 2. Resume aspirin on 06/03/2016, hold until then.   Follow-up Information    BURNETT,BRENT A, MD. Schedule an appointment as soon as possible for a visit in 2 week(s).   Specialty:  Family Medicine Why:  CBC Contact information: [address] [place] [address] [phone]          Diet recommendation: Regular diet  Activity: The patient is advised to gradually reintroduce usual activities.  Discharge Condition: good  Code Status: DNR/DNI  History of present illness: As per the H and P dictated on admission, "Jamie Daugherty is a 83 y.o. female with medical history significant of a defibrillation, anemia, hypertension, hypothyroidism, syncope, pacemaker placement in 2013. Level V caveat applies as patient does not remember the full extent of symptoms by which she was brought to the emergency room. Patient's last known memory prior to onset of events was ambulating to the bathroom and sitting on the toilet. Per nursing home staff report patient was found unresponsive slumped to the side on the toilet with a large amount of blood and stool in the toilet. EMS was called to evaluate patient who is described as being fairly unresponsive for approximately 3 minutes. There is a single report of seizure-like activity enroute by EMS with no further descriptive symptoms. No described post-ictal  state. Last colonoscopy several years ago w/o reported abnromalities. Denies recent or associated palpitations, cp, SOB, n/v/d/ abdominal pain, dysuria, frequency, back pain, flank pain, neck stiffness, dizziness, LOC, vertigo, headache."  Hospital Course:  Summary of her active problems in the hospital is as following. GI bleed:  likely diverticular bleed H/o Constipation and no h/o GI bleed. No melena. No anticoagulation other than ASA 81 and no NSAID use. No abdominal discomfort. Last colonoscopy several years ago and reported to be normal. Discussed case w/, Theodis Aguas of [facility], who agrees w/ conservative mgt at this time and formal consult if further episodes of bleeding. Pt does not want aggressive measures/workup.  Until stool softener. H&H remains stable. Did not have any further episodes of GI bleeding in the hospital. Outpatient follow-up. Hold aspirin for 1 week.  Symptomatic anemia: Hgb 9.4 w/ last known value of 11.2 in November 2017 (per SNF records). Likely from combination of chronic disease and acute GI loss. Syncopal episode reported Hemoglobin remained stable. Syncope is likely associated with her chronic orthostasis.  Syncope: Likely combination of vasovagal with chronic orthostatic hypotension. Occurred after large bloody bowel movement while sitting on the toilet. Orthostatic vital signs frankly positive in the ED with a 30 mmHg drop in systolic pressure. History of A. fib with pacemaker in place. Pacemaker interrogated with no events noted. Patient is not interested in the aggressive workup - No significant events on Telemetry, negative troponin Continue atenolol on discharge. Continue teds stocking. PTOT consult.  Seizure like episode. No evidence of active seizures, EEG  negative. Likely associated with syncope.  Afib w/ pacemaker: Pacemaker interrogation reveals no abnormalities - continue amio, stop atenolol  Hypothyroid: - continue  synthroid  All other chronic medical condition were stable during the hospitalization.  Patient was seen by physical therapy, who recommended SNF, which was arranged by Education officer, museum and case Freight forwarder. On the day of the discharge the patient's vitals were stable, and no other acute medical condition were reported by patient. the patient was felt safe to be discharge at SNF with therapy.  Procedures and Results:  none   Consultations:  none  DISCHARGE MEDICATION: Current Discharge Medication List    START taking these medications   Details  pantoprazole (PROTONIX) 40 MG tablet Take 1 tablet (40 mg total) by mouth daily. Qty: 30 tablet, Refills: 0      CONTINUE these medications which have CHANGED   Details  aspirin 81 MG chewable tablet Chew 1 tablet (81 mg total) by mouth daily. Start taking on 06/03/2016.      CONTINUE these medications which have NOT CHANGED   Details  amiodarone (PACERONE) 200 MG tablet TAKE 1 TABLET BY MOUTH EVERY DAY Qty: 90 tablet, Refills: 1    docusate sodium (COLACE) 100 MG capsule Take 1 capsule (100 mg total) by mouth 2 (two) times daily. Qty: 10 capsule, Refills: 0    levothyroxine (SYNTHROID, LEVOTHROID) 75 MCG tablet Take 1 tablet (75 mcg total) by mouth daily before breakfast. Qty: 30 tablet, Refills: 6    NUTRITIONAL SUPPLEMENT LIQD Take 1 each by mouth 2 (two) times daily.    polyethylene glycol (MIRALAX / GLYCOLAX) packet Take 17 g by mouth daily. Qty: 14 each, Refills: 0      STOP taking these medications     atenolol (TENORMIN) 25 MG tablet      enoxaparin (LOVENOX) 40 MG/0.4ML injection      HYDROcodone-acetaminophen (NORCO) 7.5-325 MG tablet        Allergies  Allergen Reactions  . Penicillins Other (See Comments)    Unknown. Tolerates cephalexin.   Discharge Instructions    Diet general    Complete by:  As directed    Discharge instructions    Complete by:  As directed    It is important that you read following  instructions as well as go over your medication list with RN to help you understand your care after this hospitalization.  Discharge Instructions: Please follow-up with PCP in one week  Please request your primary care physician to go over all Hospital Tests and Procedure/Radiological results at the follow up,  Please get all Hospital records sent to your PCP by signing hospital release before you go home.   You were cared for by a hospitalist during your hospital stay. If you have any questions about your discharge medications or the care you received while you were in the hospital after you are discharged, you can call the unit and ask to speak with the hospitalist on call if the hospitalist that took care of you is not available.  Once you are discharged, your primary care physician will handle any further medical issues. Please note that NO REFILLS for any discharge medications will be authorized once you are discharged, as it is imperative that you return to your primary care physician (or establish a relationship with a primary care physician if you do not have one) for your aftercare needs so that they can reassess your need for medications and monitor your lab values. You Must read complete  instructions/literature along with all the possible adverse reactions/side effects for all the Medicines you take and that have been prescribed to you. Take any new Medicines after you have completely understood and accept all the possible adverse reactions/side effects. Wear Seat belts while driving.   Increase activity slowly    Complete by:  As directed      Discharge Exam: Filed Weights   05/26/16 1641 05/27/16 0514  Weight: 48.8 kg (107 lb 9.6 oz) 47.1 kg (103 lb 12.8 oz)   Vitals:   05/27/16 0905 05/27/16 1301  BP: (!) 127/55 120/70  Pulse: 72 69  Resp: 18 18  Temp: 97.7 F (36.5 C) 97.7 F (36.5 C)   General: Appear in no distress, no Rash; Oral Mucosa moist. Cardiovascular: S1 and  S2 Present, aortic systolic Murmur, no JVD Respiratory: Bilateral Air entry present and Clear to Auscultation, no Crackles, no wheezes Abdomen: Bowel Sound present, Soft and no tenderness Extremities: no Pedal edema, no calf tenderness Neurology: Grossly no focal neuro deficit.  The results of significant diagnostics from this hospitalization (including imaging, microbiology, ancillary and laboratory) are listed below for reference.    Significant Diagnostic Studies: Dg Chest Portable 1 View  Result Date: 05/26/2016 CLINICAL DATA:  Gastrointestinal bleeding, history of atrial fibrillation and hypertension EXAM: PORTABLE CHEST 1 VIEW COMPARISON:  PA and lateral chest x-ray of August 02, 2015 FINDINGS: The lungs remain hyperinflated. There is stable density in the right pulmonary apex. There is no pleural effusion or pneumothorax. The heart and pulmonary vascularity are normal. The ICD is in stable position. There is calcification in the wall of the aortic arch. IMPRESSION: COPD.  Right apical scarring.  No pneumonia nor pulmonary edema. Thoracic aortic atherosclerosis. Electronically Signed   By: David  [place]ique M.D.   On: 05/26/2016 07:25    Microbiology: Recent Results (from the past 240 hour(s))  MRSA PCR Screening     Status: None   Collection Time: 05/26/16  5:18 PM  Result Value Ref Range Status   MRSA by PCR NEGATIVE NEGATIVE Final    Comment:        The GeneXpert MRSA Assay (FDA approved for NASAL specimens only), is one component of a comprehensive MRSA colonization surveillance program. It is not intended to diagnose MRSA infection nor to guide or monitor treatment for MRSA infections.      Labs: CBC:  Recent Labs Lab 05/26/16 0645 05/26/16 1753 05/27/16 0303  WBC 8.2 6.6 5.8  HGB 9.4* 9.1* 9.4*  HCT 29.2* 28.5* 28.1*  MCV 90.7 90.2 89.5  PLT 161 183 175   Basic Metabolic Panel:  Recent Labs Lab 05/26/16 0645 05/27/16 0303  NA 137 135  K 3.7 3.4*  CL 103  104  CO2 22 26  GLUCOSE 113* 92  BUN 25* 13  CREATININE 0.97 0.84  CALCIUM 7.9* 7.9*   Liver Function Tests:  Recent Labs Lab 05/26/16 0645  AST 18  ALT 12*  ALKPHOS 61  BILITOT 0.4  PROT 4.9*  ALBUMIN 2.7*   No results for input(s): LIPASE, AMYLASE in the last 168 hours. No results for input(s): AMMONIA in the last 168 hours. Cardiac Enzymes:  Recent Labs Lab 05/26/16 1753  TROPONINI <0.03   BNP (last 3 results) No results for input(s): BNP in the last 8760 hours. CBG:  Recent Labs Lab 05/26/16 1647 05/27/16 0650  GLUCAP 128* 90   Time spent: 35 minutes  Signed:  Berle Mull  [facility]  05/27/2016  , 2:30  PM

## 2016-05-27 NOTE — Clinical Social Work Note (Signed)
CSW facilitated patient discharge including contacting patient family and facility to confirm patient discharge plans. Clinical information faxed to facility and family agreeable with plan. CSW arranged ambulance transport via [facility] to [facility] at 4:00 pm. RN to call report prior to discharge [phone]).  CSW will sign off for now as social work intervention is no longer needed. Please consult [place] again if new needs arise.  Dayton Scrape, [place]

## 2016-05-27 NOTE — Progress Notes (Signed)
Patient report given to nurse Erick Blinks at [facility]. Marcille Blanco, RN

## 2016-05-27 NOTE — Evaluation (Signed)
Physical Therapy Evaluation Patient Details Name: Jamie Daugherty MRN: [id number] DOB: Aug 26, 1918 Today's Date: 05/27/2016   History of Present Illness  Pt is a 83 y.o. female admitted to ED from ALF on 05/26/16 after being found unresponsive; reportedly had seizure en route to hospital. Pt found to have GI bleed. Pertinent PMH includes chronic orthostatic hypotension, defibrillation, anemia, HTN, syncope, pacemaker (2013).      Clinical Impression  Pt presents to PT with orthostatic hypotension (see values below), generalized weakness, and an overall decrease in functional mobility secondary to above. PTA, pt resides at [facility]F and is mod indep with RW. Daughter-in-law present and reports short-term memory impairments at baseline; pt reports history of "fainting" spells, but not since 08/2015. Today, pt able to amb in hallway with RW and min guard for balance. Reports symptoms after standing ~2 min which resolved in sitting; pt asymptomatic with amb. Educ on fall risk, fall prevention and orthostatic hypotension symptoms. Pt would benefit from continued PT services to maximize functional mobility and independence prior to d/c back to ALF.    Rest (sitting in bed) BP 164/45 Sitting EOB 135/61 Standing 98/54    Follow Up Recommendations No PT follow up    Equipment Recommendations  None recommended by PT    Recommendations for Other Services       Precautions / Restrictions Precautions Precautions: Fall Precaution Comments: Watch orthostatics Restrictions Weight Bearing Restrictions: No      Mobility  Bed Mobility Overal bed mobility: Modified Independent                Transfers Overall transfer level: Needs assistance Equipment used: Rolling Wimes (2 wheeled) Transfers: Sit to/from Stand Sit to Stand: Min guard         General transfer comment: Stood x2 with RW and min guard for balance. Pt reports feeling "squeamish" after ~2 min standing, which resolved  in sitting.   Ambulation/Gait Ambulation/Gait assistance: Min guard Ambulation Distance (Feet): 150 Feet Assistive device: Rolling Priestly (2 wheeled) Gait Pattern/deviations: Step-through pattern Gait velocity: Decreased   General Gait Details: Amb with RW and min guard for balance; no LOB. Pt asymptomatic with amb.   Stairs            Wheelchair Mobility    Modified Rankin (Stroke Patients Only)       Balance Overall balance assessment: Needs assistance Sitting-balance support: No upper extremity supported;Feet supported Sitting balance-Leahy Scale: Good     Standing balance support: Bilateral upper extremity supported;During functional activity Standing balance-Leahy Scale: Poor Standing balance comment: Reliant on BUE support for standing balance                              Pertinent Vitals/Pain Pain Assessment: No/denies pain    Home Living Family/patient expects to be discharged to:: Assisted living               Home Equipment: Gilford Rile - 2 wheels Additional Comments: Pt resides at [facility]    Prior Function Level of Independence: Independent with assistive device(s)         Comments: Mod indep with RW; does not drive. [facility] provides meals; pt mod indep with ADLs. Reports history of fainting spells (most likely secondary to orthostatic hypotension)     Hand Dominance        Extremity/Trunk Assessment   Upper Extremity Assessment Upper Extremity Assessment: Overall WFL for tasks assessed    Lower  Extremity Assessment Lower Extremity Assessment: Generalized weakness       Communication   Communication: No difficulties  Cognition Arousal/Alertness: Awake/alert Behavior During Therapy: WFL for tasks assessed/performed Overall Cognitive Status: History of cognitive impairments - at baseline Area of Impairment: Memory;Orientation                 Orientation Level: Disoriented to;Time;Situation    Memory: Decreased short-term memory         General Comments: Pt states the year is 1946, redirected to current date; when asked the date again at end of session, pt again reported 21. Daughter-in-law present and states pt has short-term memory impairments at baseline      General Comments      Exercises     Assessment/Plan    PT Assessment Patient needs continued PT services  PT Problem List Decreased strength;Cardiopulmonary status limiting activity;Decreased activity tolerance;Decreased balance;Decreased mobility       PT Treatment Interventions Gait training;Therapeutic activities;Balance training;Patient/family education;Therapeutic exercise;Functional mobility training    PT Goals (Current goals can be found in the Care Plan section)  Acute Rehab PT Goals Patient Stated Goal: Return to ALF PT Goal Formulation: With patient/family Time For Goal Achievement: 06/10/16 Potential to Achieve Goals: Good    Frequency Min 2X/week   Barriers to discharge        Co-evaluation               End of Session Equipment Utilized During Treatment: Gait belt Activity Tolerance: Patient tolerated treatment well Patient left: in bed;with family/visitor present;with call bell/phone within reach Nurse Communication: Mobility status PT Visit Diagnosis: History of falling (Z91.81);Other abnormalities of gait and mobility (R26.89)    Time: 1349-1415 PT Time Calculation (min) (ACUTE ONLY): 26 min   Charges:   PT Evaluation $PT Eval Low Complexity: 1 Procedure PT Treatments $Gait Training: 8-22 mins   PT G Codes:   PT G-Codes **NOT FOR INPATIENT CLASS** Functional Assessment Tool Used: Clinical judgement Functional Limitation: Mobility: Walking and moving around Mobility: Walking and Moving Around Current Status (N1657): At least 1 percent but less than 20 percent impaired, limited or restricted Mobility: Walking and Moving Around Goal Status [phone]): At least 1  percent but less than 20 percent impaired, limited or restricted   Enis Gash, SPT Office-[phone]  Mabeline Caras 05/27/2016, 3:19 PM

## 2016-06-01 DIAGNOSIS — Z79899 Other long term (current) drug therapy: Secondary | ICD-10-CM | POA: Diagnosis not present

## 2016-06-01 DIAGNOSIS — D649 Anemia, unspecified: Secondary | ICD-10-CM | POA: Diagnosis not present

## 2016-06-16 ENCOUNTER — Ambulatory Visit (INDEPENDENT_AMBULATORY_CARE_PROVIDER_SITE_OTHER): Payer: Medicare Other | Admitting: *Deleted

## 2016-06-16 DIAGNOSIS — I495 Sick sinus syndrome: Secondary | ICD-10-CM | POA: Diagnosis not present

## 2016-06-16 NOTE — Progress Notes (Signed)
Remote pacemaker transmission.   

## 2016-06-17 ENCOUNTER — Encounter: Payer: Self-pay | Admitting: Cardiology

## 2016-06-18 LAB — CUP PACEART REMOTE DEVICE CHECK
Implantable Lead Implant Date: 20130729
Implantable Lead Location: 753859
Implantable Lead Location: 753860
Implantable Lead Model: 4456
Implantable Lead Serial Number: 522401
Implantable Pulse Generator Implant Date: 20130729
Lead Channel Impedance Value: 363 Ohm
Lead Channel Pacing Threshold Amplitude: 0.9 V
Lead Channel Pacing Threshold Pulse Width: 0.4 ms
Lead Channel Setting Pacing Amplitude: 2.6 V
Lead Channel Setting Pacing Pulse Width: 0.4 ms
Lead Channel Setting Sensing Sensitivity: 1 mV
MDC IDC LEAD IMPLANT DT: 20130729
MDC IDC LEAD SERIAL: 466820
MDC IDC MSMT BATTERY REMAINING LONGEVITY: 42 mo
MDC IDC MSMT BATTERY REMAINING PERCENTAGE: 65 %
MDC IDC MSMT LEADCHNL RA IMPEDANCE VALUE: 659 Ohm
MDC IDC MSMT LEADCHNL RA PACING THRESHOLD AMPLITUDE: 0.8 V
MDC IDC MSMT LEADCHNL RA PACING THRESHOLD PULSEWIDTH: 0.4 ms
MDC IDC PG SERIAL: 131216
MDC IDC SESS DTM: 20180502090000
MDC IDC SET LEADCHNL RV PACING AMPLITUDE: 1.4 V
MDC IDC STAT BRADY RA PERCENT PACED: 99 %
MDC IDC STAT BRADY RV PERCENT PACED: 5 %

## 2016-07-01 DIAGNOSIS — Z79899 Other long term (current) drug therapy: Secondary | ICD-10-CM | POA: Diagnosis not present

## 2016-07-01 DIAGNOSIS — D649 Anemia, unspecified: Secondary | ICD-10-CM | POA: Diagnosis not present

## 2016-07-27 DIAGNOSIS — K922 Gastrointestinal hemorrhage, unspecified: Secondary | ICD-10-CM | POA: Diagnosis not present

## 2016-07-27 DIAGNOSIS — R55 Syncope and collapse: Secondary | ICD-10-CM | POA: Diagnosis not present

## 2016-07-27 DIAGNOSIS — D500 Iron deficiency anemia secondary to blood loss (chronic): Secondary | ICD-10-CM | POA: Diagnosis not present

## 2016-07-27 DIAGNOSIS — I5022 Chronic systolic (congestive) heart failure: Secondary | ICD-10-CM | POA: Diagnosis not present

## 2016-07-27 DIAGNOSIS — I10 Essential (primary) hypertension: Secondary | ICD-10-CM | POA: Diagnosis not present

## 2016-07-27 DIAGNOSIS — I4891 Unspecified atrial fibrillation: Secondary | ICD-10-CM | POA: Diagnosis not present

## 2016-07-28 DIAGNOSIS — R55 Syncope and collapse: Secondary | ICD-10-CM | POA: Diagnosis not present

## 2016-08-02 DIAGNOSIS — E039 Hypothyroidism, unspecified: Secondary | ICD-10-CM | POA: Diagnosis not present

## 2016-08-02 DIAGNOSIS — Z79899 Other long term (current) drug therapy: Secondary | ICD-10-CM | POA: Diagnosis not present

## 2016-09-06 ENCOUNTER — Telehealth: Payer: Self-pay

## 2016-09-06 NOTE — Telephone Encounter (Signed)
Spoke with the DON at [facility] who states that patient lost remote monitor equipment and is requesting new equipment. I confirmed mailing address for [place]. [place], [place]. Reschedule remote check for 8/13.

## 2016-09-15 DIAGNOSIS — I10 Essential (primary) hypertension: Secondary | ICD-10-CM | POA: Diagnosis not present

## 2016-09-15 DIAGNOSIS — D649 Anemia, unspecified: Secondary | ICD-10-CM | POA: Diagnosis not present

## 2016-09-15 DIAGNOSIS — I4891 Unspecified atrial fibrillation: Secondary | ICD-10-CM | POA: Diagnosis not present

## 2016-09-15 DIAGNOSIS — G301 Alzheimer's disease with late onset: Secondary | ICD-10-CM | POA: Diagnosis not present

## 2016-09-27 ENCOUNTER — Ambulatory Visit (INDEPENDENT_AMBULATORY_CARE_PROVIDER_SITE_OTHER): Payer: Medicare Other | Admitting: *Deleted

## 2016-09-27 DIAGNOSIS — I480 Paroxysmal atrial fibrillation: Secondary | ICD-10-CM

## 2016-09-27 DIAGNOSIS — I495 Sick sinus syndrome: Secondary | ICD-10-CM

## 2016-09-27 NOTE — Progress Notes (Signed)
Remote pacemaker check. 

## 2016-09-28 LAB — CUP PACEART REMOTE DEVICE CHECK
Battery Remaining Longevity: 36 mo
Battery Remaining Percentage: 61 %
Brady Statistic RV Percent Paced: 5 %
Implantable Lead Implant Date: 20130729
Implantable Lead Location: 753860
Implantable Lead Model: 4456
Implantable Lead Model: 4479
Implantable Lead Serial Number: 466820
Implantable Lead Serial Number: 522401
Lead Channel Impedance Value: 693 Ohm
Lead Channel Pacing Threshold Amplitude: 0.8 V
Lead Channel Pacing Threshold Amplitude: 0.8 V
Lead Channel Pacing Threshold Pulse Width: 0.4 ms
Lead Channel Setting Pacing Amplitude: 1.4 V
Lead Channel Setting Pacing Amplitude: 2.6 V
Lead Channel Setting Pacing Pulse Width: 0.4 ms
MDC IDC LEAD IMPLANT DT: 20130729
MDC IDC LEAD LOCATION: 753859
MDC IDC MSMT LEADCHNL RV IMPEDANCE VALUE: 382 Ohm
MDC IDC MSMT LEADCHNL RV PACING THRESHOLD PULSEWIDTH: 0.4 ms
MDC IDC PG IMPLANT DT: 20130729
MDC IDC PG SERIAL: 131216
MDC IDC SESS DTM: 20180813082100
MDC IDC SET LEADCHNL RV SENSING SENSITIVITY: 1 mV
MDC IDC STAT BRADY RA PERCENT PACED: 97 %

## 2016-10-07 ENCOUNTER — Encounter: Payer: Self-pay | Admitting: Cardiology

## 2016-10-16 DIAGNOSIS — E785 Hyperlipidemia, unspecified: Secondary | ICD-10-CM | POA: Diagnosis not present

## 2016-10-16 DIAGNOSIS — E039 Hypothyroidism, unspecified: Secondary | ICD-10-CM | POA: Diagnosis not present

## 2016-10-16 DIAGNOSIS — D649 Anemia, unspecified: Secondary | ICD-10-CM | POA: Diagnosis not present

## 2016-10-16 DIAGNOSIS — I10 Essential (primary) hypertension: Secondary | ICD-10-CM | POA: Diagnosis not present

## 2016-11-25 DIAGNOSIS — K5900 Constipation, unspecified: Secondary | ICD-10-CM | POA: Diagnosis not present

## 2016-11-25 DIAGNOSIS — K922 Gastrointestinal hemorrhage, unspecified: Secondary | ICD-10-CM | POA: Diagnosis not present

## 2016-11-25 DIAGNOSIS — I4891 Unspecified atrial fibrillation: Secondary | ICD-10-CM | POA: Diagnosis not present

## 2016-11-25 DIAGNOSIS — D500 Iron deficiency anemia secondary to blood loss (chronic): Secondary | ICD-10-CM | POA: Diagnosis not present

## 2016-11-25 DIAGNOSIS — E039 Hypothyroidism, unspecified: Secondary | ICD-10-CM | POA: Diagnosis not present

## 2016-11-25 DIAGNOSIS — M6281 Muscle weakness (generalized): Secondary | ICD-10-CM | POA: Diagnosis not present

## 2016-11-25 DIAGNOSIS — R54 Age-related physical debility: Secondary | ICD-10-CM | POA: Diagnosis not present

## 2016-12-08 DIAGNOSIS — E039 Hypothyroidism, unspecified: Secondary | ICD-10-CM | POA: Diagnosis not present

## 2016-12-08 DIAGNOSIS — I4891 Unspecified atrial fibrillation: Secondary | ICD-10-CM | POA: Diagnosis not present

## 2016-12-08 DIAGNOSIS — E46 Unspecified protein-calorie malnutrition: Secondary | ICD-10-CM | POA: Diagnosis not present

## 2016-12-08 DIAGNOSIS — K5900 Constipation, unspecified: Secondary | ICD-10-CM | POA: Diagnosis not present

## 2016-12-08 DIAGNOSIS — M6281 Muscle weakness (generalized): Secondary | ICD-10-CM | POA: Diagnosis not present

## 2016-12-08 DIAGNOSIS — Z7982 Long term (current) use of aspirin: Secondary | ICD-10-CM | POA: Diagnosis not present

## 2016-12-08 DIAGNOSIS — K922 Gastrointestinal hemorrhage, unspecified: Secondary | ICD-10-CM | POA: Diagnosis not present

## 2016-12-08 DIAGNOSIS — D500 Iron deficiency anemia secondary to blood loss (chronic): Secondary | ICD-10-CM | POA: Diagnosis not present

## 2016-12-27 ENCOUNTER — Ambulatory Visit (INDEPENDENT_AMBULATORY_CARE_PROVIDER_SITE_OTHER): Payer: Medicare Other | Admitting: *Deleted

## 2016-12-27 DIAGNOSIS — I495 Sick sinus syndrome: Secondary | ICD-10-CM | POA: Diagnosis not present

## 2016-12-27 NOTE — Progress Notes (Signed)
Remote pacemaker transmission.   

## 2016-12-29 LAB — CUP PACEART REMOTE DEVICE CHECK
Battery Remaining Percentage: 58 %
Brady Statistic RA Percent Paced: 97 %
Brady Statistic RV Percent Paced: 4 %
Date Time Interrogation Session: 20181112092100
Implantable Lead Implant Date: 20130729
Implantable Lead Location: 753859
Implantable Lead Model: 4456
Implantable Lead Model: 4479
Lead Channel Impedance Value: 383 Ohm
Lead Channel Impedance Value: 682 Ohm
Lead Channel Pacing Threshold Amplitude: 0.8 V
Lead Channel Pacing Threshold Amplitude: 0.9 V
MDC IDC LEAD IMPLANT DT: 20130729
MDC IDC LEAD LOCATION: 753860
MDC IDC LEAD SERIAL: 466820
MDC IDC LEAD SERIAL: 522401
MDC IDC MSMT BATTERY REMAINING LONGEVITY: 36 mo
MDC IDC MSMT LEADCHNL RA PACING THRESHOLD PULSEWIDTH: 0.4 ms
MDC IDC MSMT LEADCHNL RV PACING THRESHOLD PULSEWIDTH: 0.4 ms
MDC IDC PG IMPLANT DT: 20130729
MDC IDC SET LEADCHNL RA PACING AMPLITUDE: 2.6 V
MDC IDC SET LEADCHNL RV PACING AMPLITUDE: 1.3 V
MDC IDC SET LEADCHNL RV PACING PULSEWIDTH: 0.4 ms
MDC IDC SET LEADCHNL RV SENSING SENSITIVITY: 1 mV
Pulse Gen Serial Number: 131216

## 2016-12-31 ENCOUNTER — Encounter: Payer: Self-pay | Admitting: Cardiology

## 2017-01-12 DIAGNOSIS — Z7982 Long term (current) use of aspirin: Secondary | ICD-10-CM | POA: Diagnosis not present

## 2017-01-12 DIAGNOSIS — K922 Gastrointestinal hemorrhage, unspecified: Secondary | ICD-10-CM | POA: Diagnosis not present

## 2017-01-12 DIAGNOSIS — M6281 Muscle weakness (generalized): Secondary | ICD-10-CM | POA: Diagnosis not present

## 2017-01-12 DIAGNOSIS — E46 Unspecified protein-calorie malnutrition: Secondary | ICD-10-CM | POA: Diagnosis not present

## 2017-01-12 DIAGNOSIS — E039 Hypothyroidism, unspecified: Secondary | ICD-10-CM | POA: Diagnosis not present

## 2017-01-12 DIAGNOSIS — I4891 Unspecified atrial fibrillation: Secondary | ICD-10-CM | POA: Diagnosis not present

## 2017-01-12 DIAGNOSIS — K5900 Constipation, unspecified: Secondary | ICD-10-CM | POA: Diagnosis not present

## 2017-01-12 DIAGNOSIS — D500 Iron deficiency anemia secondary to blood loss (chronic): Secondary | ICD-10-CM | POA: Diagnosis not present

## 2017-02-10 DIAGNOSIS — K922 Gastrointestinal hemorrhage, unspecified: Secondary | ICD-10-CM | POA: Diagnosis not present

## 2017-02-10 DIAGNOSIS — K5900 Constipation, unspecified: Secondary | ICD-10-CM | POA: Diagnosis not present

## 2017-02-10 DIAGNOSIS — E46 Unspecified protein-calorie malnutrition: Secondary | ICD-10-CM | POA: Diagnosis not present

## 2017-02-10 DIAGNOSIS — D500 Iron deficiency anemia secondary to blood loss (chronic): Secondary | ICD-10-CM | POA: Diagnosis not present

## 2017-02-10 DIAGNOSIS — Z7982 Long term (current) use of aspirin: Secondary | ICD-10-CM | POA: Diagnosis not present

## 2017-02-10 DIAGNOSIS — E039 Hypothyroidism, unspecified: Secondary | ICD-10-CM | POA: Diagnosis not present

## 2017-02-10 DIAGNOSIS — I4891 Unspecified atrial fibrillation: Secondary | ICD-10-CM | POA: Diagnosis not present

## 2017-02-10 DIAGNOSIS — M6281 Muscle weakness (generalized): Secondary | ICD-10-CM | POA: Diagnosis not present

## 2017-03-02 ENCOUNTER — Encounter: Payer: Medicare Other | Admitting: Physician Assistant

## 2017-03-10 DIAGNOSIS — K922 Gastrointestinal hemorrhage, unspecified: Secondary | ICD-10-CM | POA: Diagnosis not present

## 2017-03-10 DIAGNOSIS — Z7982 Long term (current) use of aspirin: Secondary | ICD-10-CM | POA: Diagnosis not present

## 2017-03-10 DIAGNOSIS — K5900 Constipation, unspecified: Secondary | ICD-10-CM | POA: Diagnosis not present

## 2017-03-10 DIAGNOSIS — E039 Hypothyroidism, unspecified: Secondary | ICD-10-CM | POA: Diagnosis not present

## 2017-03-10 DIAGNOSIS — M6281 Muscle weakness (generalized): Secondary | ICD-10-CM | POA: Diagnosis not present

## 2017-03-10 DIAGNOSIS — E46 Unspecified protein-calorie malnutrition: Secondary | ICD-10-CM | POA: Diagnosis not present

## 2017-03-10 DIAGNOSIS — I4891 Unspecified atrial fibrillation: Secondary | ICD-10-CM | POA: Diagnosis not present

## 2017-03-10 DIAGNOSIS — D500 Iron deficiency anemia secondary to blood loss (chronic): Secondary | ICD-10-CM | POA: Diagnosis not present

## 2017-03-10 DIAGNOSIS — Q845 Enlarged and hypertrophic nails: Secondary | ICD-10-CM | POA: Diagnosis not present

## 2017-03-28 ENCOUNTER — Ambulatory Visit (INDEPENDENT_AMBULATORY_CARE_PROVIDER_SITE_OTHER): Payer: Medicare Other | Admitting: *Deleted

## 2017-03-28 ENCOUNTER — Telehealth: Payer: Self-pay | Admitting: Cardiology

## 2017-03-28 DIAGNOSIS — I495 Sick sinus syndrome: Secondary | ICD-10-CM | POA: Diagnosis not present

## 2017-03-28 NOTE — Telephone Encounter (Signed)
Confirmed remote transmission w/ pt daughter in law.   

## 2017-03-29 NOTE — Progress Notes (Signed)
Remote pacemaker transmission.   

## 2017-03-30 ENCOUNTER — Encounter: Payer: Self-pay | Admitting: Cardiology

## 2017-04-05 LAB — CUP PACEART REMOTE DEVICE CHECK
Brady Statistic RV Percent Paced: 4 %
Implantable Lead Location: 753859
Implantable Lead Model: 4479
Implantable Lead Serial Number: 466820
Lead Channel Impedance Value: 356 Ohm
Lead Channel Impedance Value: 623 Ohm
Lead Channel Pacing Threshold Amplitude: 0.8 V
Lead Channel Pacing Threshold Amplitude: 0.9 V
Lead Channel Setting Pacing Amplitude: 1.4 V
MDC IDC LEAD IMPLANT DT: 20130729
MDC IDC LEAD IMPLANT DT: 20130729
MDC IDC LEAD LOCATION: 753860
MDC IDC LEAD SERIAL: 522401
MDC IDC MSMT BATTERY REMAINING LONGEVITY: 36 mo
MDC IDC MSMT BATTERY REMAINING PERCENTAGE: 57 %
MDC IDC MSMT LEADCHNL RA PACING THRESHOLD PULSEWIDTH: 0.4 ms
MDC IDC MSMT LEADCHNL RV PACING THRESHOLD PULSEWIDTH: 0.4 ms
MDC IDC PG IMPLANT DT: 20130729
MDC IDC PG SERIAL: 131216
MDC IDC SESS DTM: 20190211203100
MDC IDC SET LEADCHNL RA PACING AMPLITUDE: 2.6 V
MDC IDC SET LEADCHNL RV PACING PULSEWIDTH: 0.4 ms
MDC IDC SET LEADCHNL RV SENSING SENSITIVITY: 1 mV
MDC IDC STAT BRADY RA PERCENT PACED: 97 %

## 2017-04-08 DIAGNOSIS — M6281 Muscle weakness (generalized): Secondary | ICD-10-CM | POA: Diagnosis not present

## 2017-04-08 DIAGNOSIS — K5900 Constipation, unspecified: Secondary | ICD-10-CM | POA: Diagnosis not present

## 2017-04-08 DIAGNOSIS — Q845 Enlarged and hypertrophic nails: Secondary | ICD-10-CM | POA: Diagnosis not present

## 2017-04-08 DIAGNOSIS — Z7982 Long term (current) use of aspirin: Secondary | ICD-10-CM | POA: Diagnosis not present

## 2017-04-08 DIAGNOSIS — I4891 Unspecified atrial fibrillation: Secondary | ICD-10-CM | POA: Diagnosis not present

## 2017-04-08 DIAGNOSIS — K922 Gastrointestinal hemorrhage, unspecified: Secondary | ICD-10-CM | POA: Diagnosis not present

## 2017-04-08 DIAGNOSIS — E039 Hypothyroidism, unspecified: Secondary | ICD-10-CM | POA: Diagnosis not present

## 2017-04-08 DIAGNOSIS — E46 Unspecified protein-calorie malnutrition: Secondary | ICD-10-CM | POA: Diagnosis not present

## 2017-04-08 DIAGNOSIS — D500 Iron deficiency anemia secondary to blood loss (chronic): Secondary | ICD-10-CM | POA: Diagnosis not present

## 2017-04-09 DIAGNOSIS — E039 Hypothyroidism, unspecified: Secondary | ICD-10-CM | POA: Diagnosis not present

## 2017-04-09 DIAGNOSIS — I10 Essential (primary) hypertension: Secondary | ICD-10-CM | POA: Diagnosis not present

## 2017-04-09 DIAGNOSIS — D649 Anemia, unspecified: Secondary | ICD-10-CM | POA: Diagnosis not present

## 2017-04-19 DIAGNOSIS — Q845 Enlarged and hypertrophic nails: Secondary | ICD-10-CM | POA: Diagnosis not present

## 2017-04-19 DIAGNOSIS — I739 Peripheral vascular disease, unspecified: Secondary | ICD-10-CM | POA: Diagnosis not present

## 2017-04-19 DIAGNOSIS — L603 Nail dystrophy: Secondary | ICD-10-CM | POA: Diagnosis not present

## 2017-04-19 DIAGNOSIS — B351 Tinea unguium: Secondary | ICD-10-CM | POA: Diagnosis not present

## 2017-05-06 DIAGNOSIS — Z7982 Long term (current) use of aspirin: Secondary | ICD-10-CM | POA: Diagnosis not present

## 2017-05-06 DIAGNOSIS — K922 Gastrointestinal hemorrhage, unspecified: Secondary | ICD-10-CM | POA: Diagnosis not present

## 2017-05-06 DIAGNOSIS — D500 Iron deficiency anemia secondary to blood loss (chronic): Secondary | ICD-10-CM | POA: Diagnosis not present

## 2017-05-06 DIAGNOSIS — Q845 Enlarged and hypertrophic nails: Secondary | ICD-10-CM | POA: Diagnosis not present

## 2017-05-06 DIAGNOSIS — E46 Unspecified protein-calorie malnutrition: Secondary | ICD-10-CM | POA: Diagnosis not present

## 2017-05-06 DIAGNOSIS — E039 Hypothyroidism, unspecified: Secondary | ICD-10-CM | POA: Diagnosis not present

## 2017-05-06 DIAGNOSIS — M6281 Muscle weakness (generalized): Secondary | ICD-10-CM | POA: Diagnosis not present

## 2017-05-06 DIAGNOSIS — K5900 Constipation, unspecified: Secondary | ICD-10-CM | POA: Diagnosis not present

## 2017-05-06 DIAGNOSIS — I4891 Unspecified atrial fibrillation: Secondary | ICD-10-CM | POA: Diagnosis not present

## 2017-05-09 DIAGNOSIS — K5900 Constipation, unspecified: Secondary | ICD-10-CM | POA: Diagnosis not present

## 2017-05-09 DIAGNOSIS — D500 Iron deficiency anemia secondary to blood loss (chronic): Secondary | ICD-10-CM | POA: Diagnosis not present

## 2017-05-09 DIAGNOSIS — Q845 Enlarged and hypertrophic nails: Secondary | ICD-10-CM | POA: Diagnosis not present

## 2017-05-09 DIAGNOSIS — Z7982 Long term (current) use of aspirin: Secondary | ICD-10-CM | POA: Diagnosis not present

## 2017-05-09 DIAGNOSIS — E039 Hypothyroidism, unspecified: Secondary | ICD-10-CM | POA: Diagnosis not present

## 2017-05-09 DIAGNOSIS — K922 Gastrointestinal hemorrhage, unspecified: Secondary | ICD-10-CM | POA: Diagnosis not present

## 2017-05-09 DIAGNOSIS — E46 Unspecified protein-calorie malnutrition: Secondary | ICD-10-CM | POA: Diagnosis not present

## 2017-05-09 DIAGNOSIS — I4891 Unspecified atrial fibrillation: Secondary | ICD-10-CM | POA: Diagnosis not present

## 2017-05-09 DIAGNOSIS — M6281 Muscle weakness (generalized): Secondary | ICD-10-CM | POA: Diagnosis not present

## 2017-05-25 ENCOUNTER — Encounter: Payer: Self-pay | Admitting: Cardiovascular Disease

## 2017-05-25 ENCOUNTER — Ambulatory Visit (INDEPENDENT_AMBULATORY_CARE_PROVIDER_SITE_OTHER): Payer: Medicare Other | Admitting: Cardiovascular Disease

## 2017-05-25 VITALS — BP 155/74 | HR 70 | Ht 66.0 in | Wt 104.0 lb

## 2017-05-25 DIAGNOSIS — Z79899 Other long term (current) drug therapy: Secondary | ICD-10-CM

## 2017-05-25 DIAGNOSIS — F0390 Unspecified dementia without behavioral disturbance: Secondary | ICD-10-CM

## 2017-05-25 DIAGNOSIS — I480 Paroxysmal atrial fibrillation: Secondary | ICD-10-CM

## 2017-05-25 DIAGNOSIS — E440 Moderate protein-calorie malnutrition: Secondary | ICD-10-CM | POA: Diagnosis not present

## 2017-05-25 DIAGNOSIS — I5022 Chronic systolic (congestive) heart failure: Secondary | ICD-10-CM

## 2017-05-25 DIAGNOSIS — Z950 Presence of cardiac pacemaker: Secondary | ICD-10-CM

## 2017-05-25 DIAGNOSIS — I495 Sick sinus syndrome: Secondary | ICD-10-CM | POA: Diagnosis not present

## 2017-05-25 DIAGNOSIS — I951 Orthostatic hypotension: Secondary | ICD-10-CM

## 2017-05-25 LAB — COMPREHENSIVE METABOLIC PANEL
ALT: 10 IU/L (ref 0–32)
AST: 15 IU/L (ref 0–40)
Albumin/Globulin Ratio: 1.6 (ref 1.2–2.2)
Albumin: 3.7 g/dL (ref 3.2–4.6)
Alkaline Phosphatase: 75 IU/L (ref 39–117)
BUN / CREAT RATIO: 17 (ref 12–28)
BUN: 16 mg/dL (ref 10–36)
Bilirubin Total: 0.2 mg/dL (ref 0.0–1.2)
CALCIUM: 8.4 mg/dL — AB (ref 8.7–10.3)
CO2: 25 mmol/L (ref 20–29)
CREATININE: 0.93 mg/dL (ref 0.57–1.00)
Chloride: 98 mmol/L (ref 96–106)
GFR calc Af Amer: 60 mL/min/{1.73_m2} (ref >59)
GFR, EST NON AFRICAN AMERICAN: 52 mL/min/{1.73_m2} — AB (ref >59)
GLOBULIN, TOTAL: 2.3 g/dL (ref 1.5–4.5)
Glucose: 110 mg/dL — ABNORMAL HIGH (ref 65–99)
Potassium: 4 mmol/L (ref 3.5–5.2)
SODIUM: 138 mmol/L (ref 134–144)
Total Protein: 6 g/dL (ref 6.0–8.5)

## 2017-05-25 LAB — TSH: TSH: 1.71 u[IU]/mL (ref 0.450–4.500)

## 2017-05-25 NOTE — Patient Instructions (Signed)
Dr Sallyanne Kuster recommends that you continue on your current medications as directed. Please refer to the Current Medication list given to you today.  Your physician recommends that you return for lab work TODAY.  Remote monitoring is used to monitor your Pacemaker or ICD from home. This monitoring reduces the number of office visits required to check your device to one time per year. It allows [place] to keep an eye on the functioning of your device to ensure it is working properly. You are scheduled for a device check from home on Monday, May 13th, 2019. You may send your transmission at any time that day. If you have a wireless device, the transmission will be sent automatically. After your physician reviews your transmission, you will receive a notification with your next transmission date.  Dr Sallyanne Kuster recommends that you schedule a follow-up appointment in 12 months with a pacemaker check. You will receive a reminder letter in the mail two months in advance. If you don't receive a letter, please call our office to schedule the follow-up appointment.  If you need a refill on your cardiac medications before your next appointment, please call your pharmacy.

## 2017-05-25 NOTE — Progress Notes (Signed)
Patient ID: Jamie Daugherty, female   DOB: Jun 29, 1918, 84 y.o.   MRN: [id number]     Cardiology Office Note    Date:  05/29/2017   ID:  Jamie Daugherty, DOB 03-06-1918, MRN [id number]  PCP:  Patient, No Pcp Per  Cardiologist:   Sanda Klein, MD   Chief Complaint  Patient presents with  . Pacemaker Check    History of Present Illness:  Jamie Daugherty is a 84 y.o. female with tachycardia related cardiomyopathy and systolic heart failure related to atrial fibrillation with rapid ventricular response, also with a history of tachycardia-bradycardia syndrome for which received a dual-chamber [employer] pacemaker in 2013.   Rate control was very difficult to achieve and heart failure could not decompensated until he started amiodarone.  Ever since that medication was initiated she has done remarkably well she has maintained sinus rhythm except for 2 brief interruptions in the last 6 months.  She generally feels well and does not have any cardiovascular complaints.  The dizziness improved once we discontinued her beta-blocker  While she still has poor short-term memory, she has not deteriorated much since her last appointment.  Her anticoagulants were discontinued after she had a couple of falls and hip fractures.  She has not had any falls since her last appointment.  Pacemaker interrogation shows normal device function. She has 97 % atrial pacing and only 4% ventricular pacing. Heart rate histogram distribution is good considering how sedentary she is. Her device is a Chemical engineer ingenio implanted in 2013 and has roughly 3 years of anticipated generator longevity.  Past Medical History:  Diagnosis Date  . Anemia   . Arthritis   . Atrial fibrillation ([place])   . History of blood transfusion   . Hypertension   . Hypothyroidism   . Pacemaker - dual chamber [facility] implanted 2013 01/25/2013  . Syncope     Past Surgical History:  Procedure Laterality Date  . ABDOMINAL  HYSTERECTOMY    . ANTERIOR APPROACH HEMI HIP ARTHROPLASTY Left 04/17/2015   Procedure: ANTERIOR APPROACH LEFT HEMI HIP ARTHROPLASTY;  Surgeon: Leandrew Koyanagi, MD;  Location: [place];  Service: Orthopedics;  Laterality: Left;  . ORIF FEMUR FRACTURE Left 08/07/2015   Procedure: OPEN REDUCTION INTERNAL FIXATION (ORIF) LEFT  PERIPROSTHETIC FEMUR FRACTURE;  Surgeon: Leandrew Koyanagi, MD;  Location: [place];  Service: Orthopedics;  Laterality: Left;  . PACEMAKER INSERTION      Outpatient Medications Prior to Visit  Medication Sig Dispense Refill  . amiodarone (PACERONE) 200 MG tablet TAKE 1 TABLET BY MOUTH EVERY DAY 90 tablet 1  . aspirin 81 MG chewable tablet Chew 1 tablet (81 mg total) by mouth daily. Start taking on 06/03/2016.    Marland Kitchen docusate sodium (COLACE) 100 MG capsule Take 1 capsule (100 mg total) by mouth 2 (two) times daily. 10 capsule 0  . levothyroxine (SYNTHROID, LEVOTHROID) 75 MCG tablet Take 1 tablet (75 mcg total) by mouth daily before breakfast. 30 tablet 6  . NUTRITIONAL SUPPLEMENT LIQD Take 1 each by mouth 2 (two) times daily.    . pantoprazole (PROTONIX) 40 MG tablet Take 1 tablet (40 mg total) by mouth daily. 30 tablet 0  . polyethylene glycol (MIRALAX / GLYCOLAX) packet Take 17 g by mouth daily. 14 each 0   No facility-administered medications prior to visit.      Allergies:   Penicillins   Social History   Socioeconomic History  . Marital status: Unknown    Spouse name: Not  on file  . Number of children: Not on file  . Years of education: Not on file  . Highest education level: Not on file  Occupational History  . Not on file  Social Needs  . Financial resource strain: Not on file  . Food insecurity:    Worry: Not on file    Inability: Not on file  . Transportation needs:    Medical: Not on file    Non-medical: Not on file  Tobacco Use  . Smoking status: Former Research scientist (life sciences)  . Smokeless tobacco: Never Used  Substance and Sexual Activity  . Alcohol use: Yes    Comment: occ    . Drug use: No  . Sexual activity: Not on file  Lifestyle  . Physical activity:    Days per week: Not on file    Minutes per session: Not on file  . Stress: Not on file  Relationships  . Social connections:    Talks on phone: Not on file    Gets together: Not on file    Attends religious service: Not on file    Active member of club or organization: Not on file    Attends meetings of clubs or organizations: Not on file    Relationship status: Not on file  Other Topics Concern  . Not on file  Social History Narrative  . Not on file     Family History:  The patient's family history includes Heart disease in her father and mother.   ROS:   Please see the history of present illness.    ROS All other systems reviewed and are negative.   PHYSICAL EXAM:   VS:  BP (!) 155/74   Pulse 70   Ht 5\' 6"  (1.676 m)   Wt 104 lb (47.2 kg)   BMI 16.79 kg/m     General: Alert, oriented x3, no distress, she looks very thin, elderly and frail, but is smiling and appears comfortable.  Healthy left subclavian pacemaker site. Head: no evidence of trauma, PERRL, EOMI, no exophtalmos or lid lag, no myxedema, no xanthelasma; normal ears, nose and oropharynx Neck: normal jugular venous pulsations and no hepatojugular reflux; brisk carotid pulses without delay and no carotid bruits Chest: clear to auscultation, no signs of consolidation by percussion or palpation, normal fremitus, symmetrical and full respiratory excursions Cardiovascular: normal position and quality of the apical impulse, regular rhythm, normal first and paradoxically split second heart sounds, no murmurs, rubs or gallops Abdomen: no tenderness or distention, no masses by palpation, no abnormal pulsatility or arterial bruits, normal bowel sounds, no hepatosplenomegaly Extremities: no clubbing, cyanosis or edema; 2+ radial, ulnar and brachial pulses bilaterally; 2+ right femoral, posterior tibial and dorsalis pedis pulses; 2+ left  femoral, posterior tibial and dorsalis pedis pulses; no subclavian or femoral bruits Neurological: grossly nonfocal Psych: Normal mood and affect   Wt Readings from Last 3 Encounters:  05/25/17 104 lb (47.2 kg)  05/27/16 103 lb 12.8 oz (47.1 kg)  03/17/16 106 lb (48.1 kg)      Studies/Labs Reviewed:   EKG:  EKG is ordered today.  It shows atrial paced ventricular sensed rhythm with left bundle branch block and long AV delay (PR 230 ms, QRS 138 ms, QTc 498 ms.  Recent Labs: 05/25/2017: ALT 10; BUN 16; Creatinine, Ser 0.93; Potassium 4.0; Sodium 138; TSH 1.710    ASSESSMENT:    1. PAF (paroxysmal atrial fibrillation) ([place])   2. Chronic systolic heart failure ([place])   3. Moderate  protein-calorie malnutrition ([place])   4. SSS (sick sinus syndrome) ([place])   5. Pacemaker   6. Orthostatic hypotension   7. Dementia without behavioral disturbance, unspecified dementia type   8. On amiodarone therapy      PLAN:  In order of problems listed above:  1. AFib: She is had a remarkably positive response to amiodarone with long-term maintenance of sinus rhythm and resolution of all the signs and symptoms of tachycardia related cardiomyopathy. Embolic risk is high (CHADSVasc 5: age 30, gender, HTN, CHF), but unfortunately she is not receiving anticoagulants due to excessively high risk of injury and bleeding 2. CHF: She is very sedentary, but there is no evidence of U bulimia and she appears to have good quality of life.  We have not rechecked her left ventricular ejection fraction, but I suspect it has returned to normal. 3. Underweight: Although her weight loss has ceased, she remains severely under nourished. 4. SSS:  Requires 100% atrial pacing, very infrequent ventricular pacing. 5. s/p PM: Normal pacemaker function, no device reprogramming was necessary today. She is not pacemaker dependent. Remote downloads every 3 months and yearly office visits. 6. Orthostatic hypotension: This has improved  substantially after stopping dose of beta blocker.  To avoid additional episodes of dizziness and falls, will have to tolerate some degree of systolic hypertension. 7. Dementia: Little change.  8. Amiodarone: Normal LFTs and TSH checked at today's visit.    Medication Adjustments/Labs and Tests Ordered: Current medicines are reviewed at length with the patient today.  Concerns regarding medicines are outlined above.  Medication changes, Labs and Tests ordered today are listed in the Patient Instructions below. Patient Instructions  Dr Sallyanne Kuster recommends that you continue on your current medications as directed. Please refer to the Current Medication list given to you today.  Your physician recommends that you return for lab work TODAY.  Remote monitoring is used to monitor your Pacemaker or ICD from home. This monitoring reduces the number of office visits required to check your device to one time per year. It allows [place] to keep an eye on the functioning of your device to ensure it is working properly. You are scheduled for a device check from home on Monday, May 13th, 2019. You may send your transmission at any time that day. If you have a wireless device, the transmission will be sent automatically. After your physician reviews your transmission, you will receive a notification with your next transmission date.  Dr Sallyanne Kuster recommends that you schedule a follow-up appointment in 12 months with a pacemaker check. You will receive a reminder letter in the mail two months in advance. If you don't receive a letter, please call our office to schedule the follow-up appointment.  If you need a refill on your cardiac medications before your next appointment, please call your pharmacy.      Signed, Sanda Klein, MD  05/29/2017 8:36 PM    [facility] [address], [place], [place]  [address] Phone: [phone]; Fax: [phone]

## 2017-05-26 DIAGNOSIS — Z79899 Other long term (current) drug therapy: Secondary | ICD-10-CM | POA: Diagnosis not present

## 2017-05-26 DIAGNOSIS — E0590 Thyrotoxicosis, unspecified without thyrotoxic crisis or storm: Secondary | ICD-10-CM | POA: Diagnosis not present

## 2017-05-26 DIAGNOSIS — E039 Hypothyroidism, unspecified: Secondary | ICD-10-CM | POA: Diagnosis not present

## 2017-05-29 ENCOUNTER — Encounter: Payer: Self-pay | Admitting: Cardiovascular Disease

## 2017-06-07 DIAGNOSIS — K5900 Constipation, unspecified: Secondary | ICD-10-CM | POA: Diagnosis not present

## 2017-06-07 DIAGNOSIS — D500 Iron deficiency anemia secondary to blood loss (chronic): Secondary | ICD-10-CM | POA: Diagnosis not present

## 2017-06-07 DIAGNOSIS — E039 Hypothyroidism, unspecified: Secondary | ICD-10-CM | POA: Diagnosis not present

## 2017-06-07 DIAGNOSIS — Z7982 Long term (current) use of aspirin: Secondary | ICD-10-CM | POA: Diagnosis not present

## 2017-06-07 DIAGNOSIS — E46 Unspecified protein-calorie malnutrition: Secondary | ICD-10-CM | POA: Diagnosis not present

## 2017-06-07 DIAGNOSIS — Q845 Enlarged and hypertrophic nails: Secondary | ICD-10-CM | POA: Diagnosis not present

## 2017-06-07 DIAGNOSIS — K922 Gastrointestinal hemorrhage, unspecified: Secondary | ICD-10-CM | POA: Diagnosis not present

## 2017-06-07 DIAGNOSIS — M6281 Muscle weakness (generalized): Secondary | ICD-10-CM | POA: Diagnosis not present

## 2017-06-07 DIAGNOSIS — I4891 Unspecified atrial fibrillation: Secondary | ICD-10-CM | POA: Diagnosis not present

## 2017-06-08 DIAGNOSIS — L03119 Cellulitis of unspecified part of limb: Secondary | ICD-10-CM | POA: Diagnosis not present

## 2017-06-08 DIAGNOSIS — R509 Fever, unspecified: Secondary | ICD-10-CM | POA: Diagnosis not present

## 2017-06-15 DIAGNOSIS — D649 Anemia, unspecified: Secondary | ICD-10-CM | POA: Diagnosis not present

## 2017-06-27 ENCOUNTER — Telehealth: Payer: Self-pay | Admitting: Cardiology

## 2017-06-27 ENCOUNTER — Ambulatory Visit (INDEPENDENT_AMBULATORY_CARE_PROVIDER_SITE_OTHER): Payer: Medicare Other | Admitting: *Deleted

## 2017-06-27 DIAGNOSIS — I495 Sick sinus syndrome: Secondary | ICD-10-CM

## 2017-06-27 NOTE — Telephone Encounter (Signed)
Confirmed remote transmission w/ pt daughter in law.   

## 2017-06-29 NOTE — Progress Notes (Signed)
Remote pacemaker transmission.   

## 2017-06-30 IMAGING — CT CT HIP*L* W/O CM
1 series · 16 of 32 positions shown, 20 images · non-contrast
Comparison: None.

CLINICAL DATA: Left hip arthroplasty April 2015. Status post fall.
Pain in left hip.

EXAM:
CT OF THE LEFT HIP WITHOUT CONTRAST
TECHNIQUE: Multidetector CT imaging of the left hip was performed according to
the standard protocol. Multiplanar CT image reconstructions were
also generated.

[Series 3: soft tissue pelvis/hip (person_name) · axial · 0.40mm/px · z∈[-241,-7]mm · 16 of 87 slices shown, 20 images]
[im 6/87  soft-tissue]
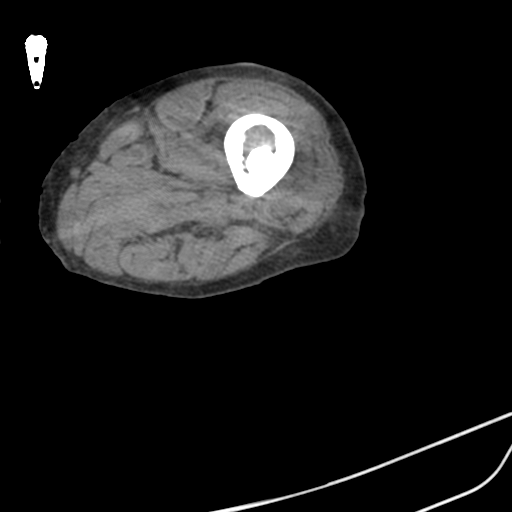
[im 6/87  bone]
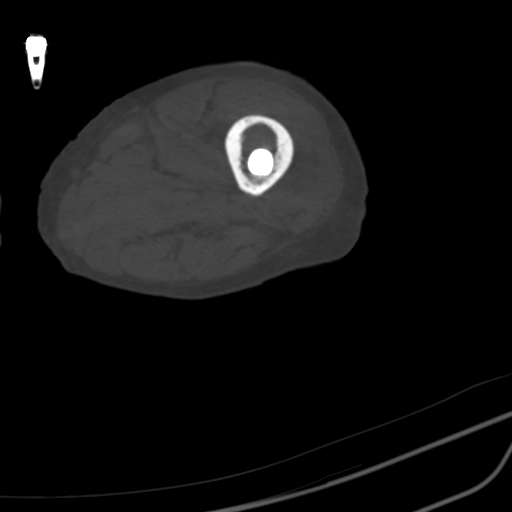
[im 12/87  soft-tissue]
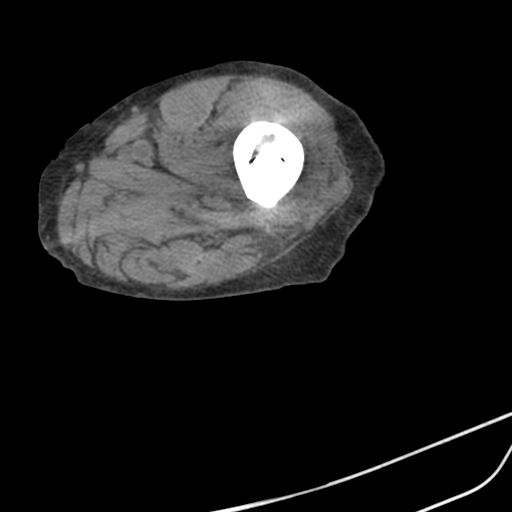
[im 17/87  soft-tissue]
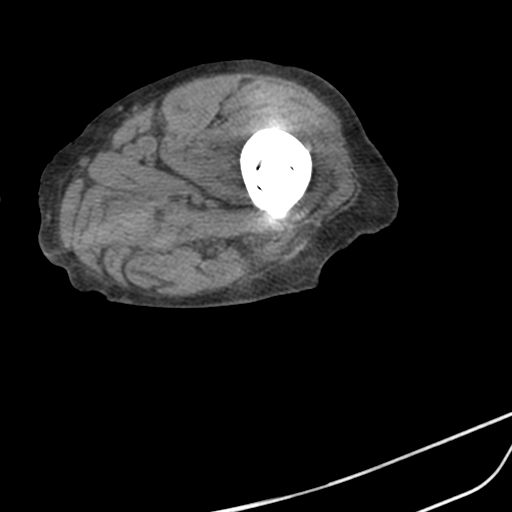
[im 23/87  soft-tissue]
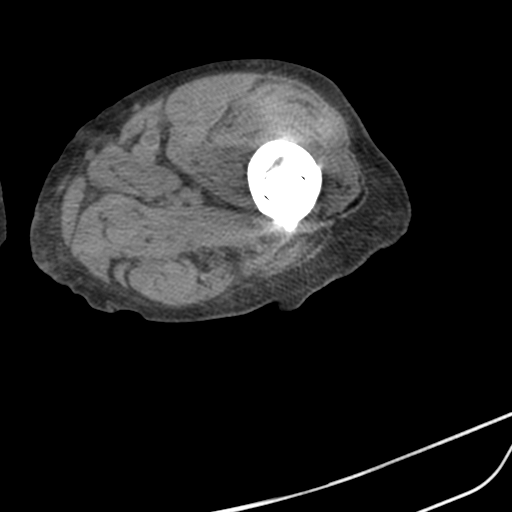
[im 28/87  soft-tissue]
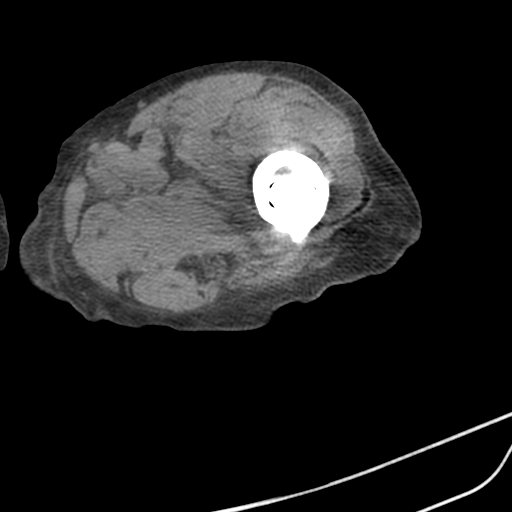
[im 34/87  soft-tissue]
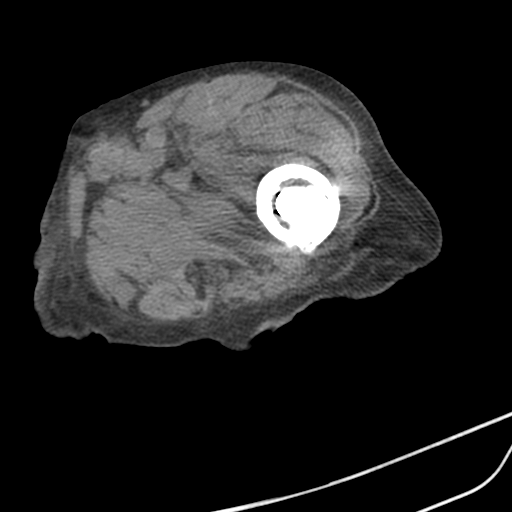
[im 39/87  soft-tissue]
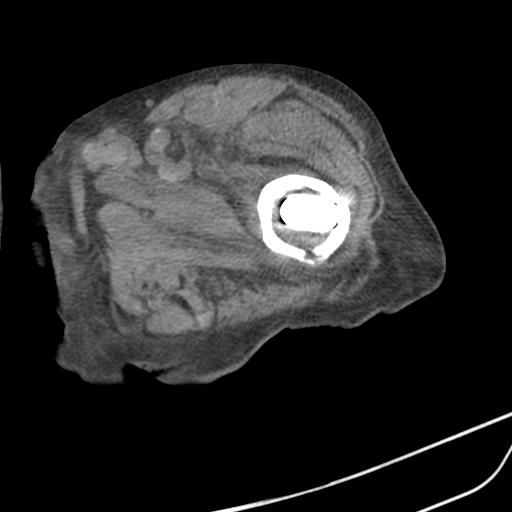
[im 48/87  soft-tissue]
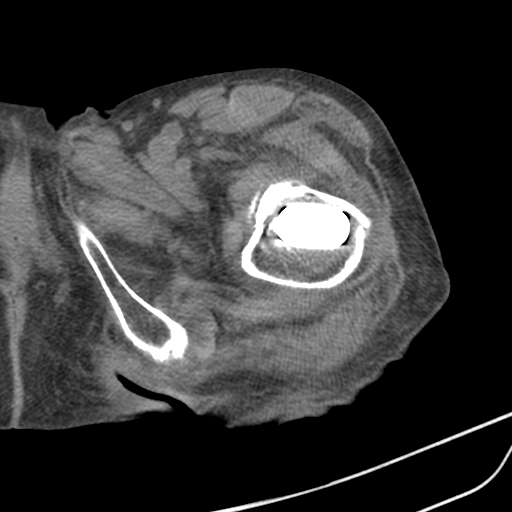
[im 53/87  soft-tissue]
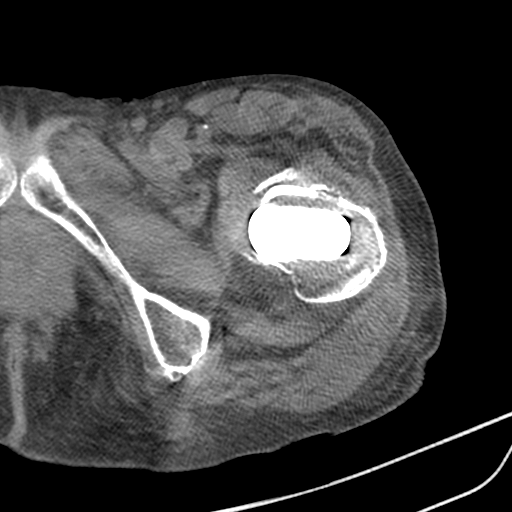
[im 53/87  bone]
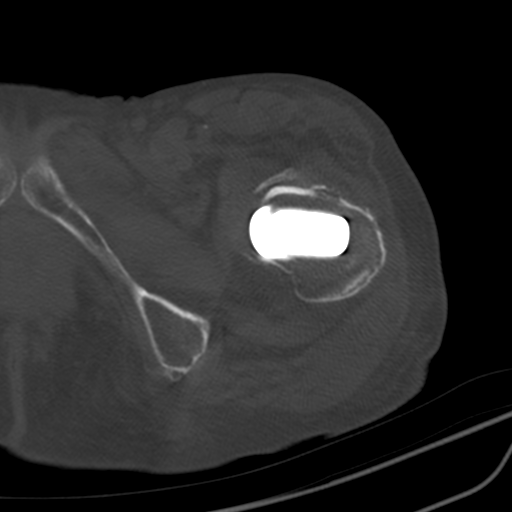
[im 59/87  soft-tissue]
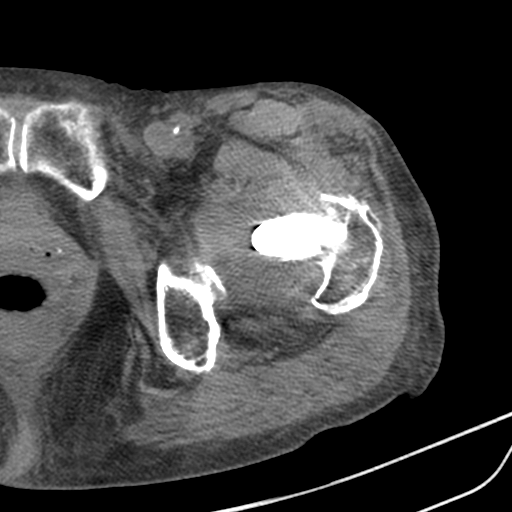
[im 64/87  soft-tissue]
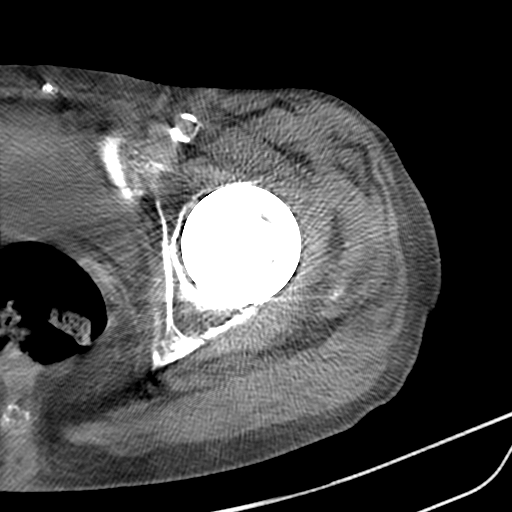
[im 70/87  soft-tissue]
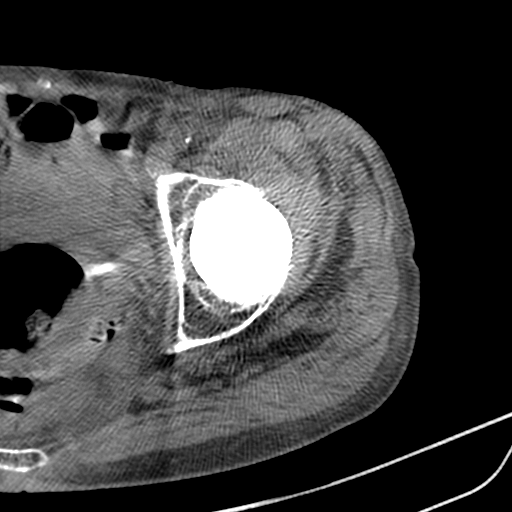
[im 75/87  soft-tissue]
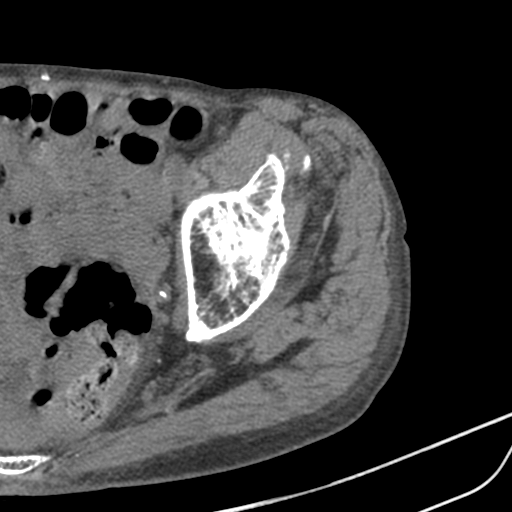
[im 75/87  lung]
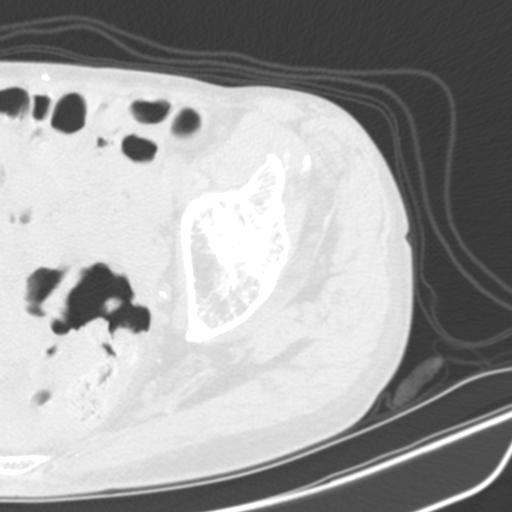
[im 78/87  lung]
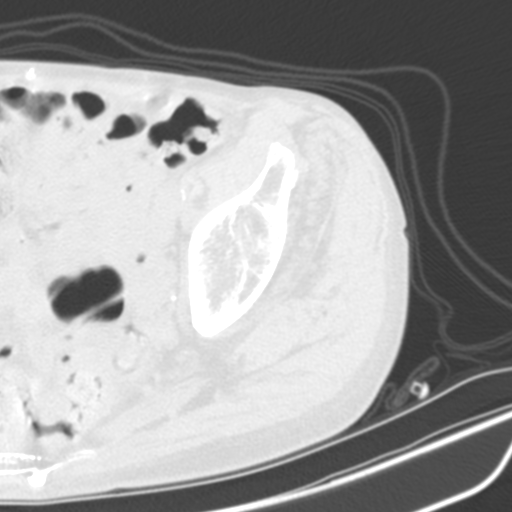
[im 81/87  soft-tissue]
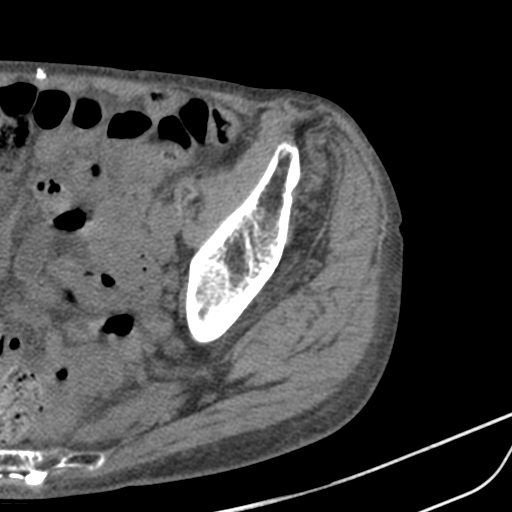
[im 81/87  lung]
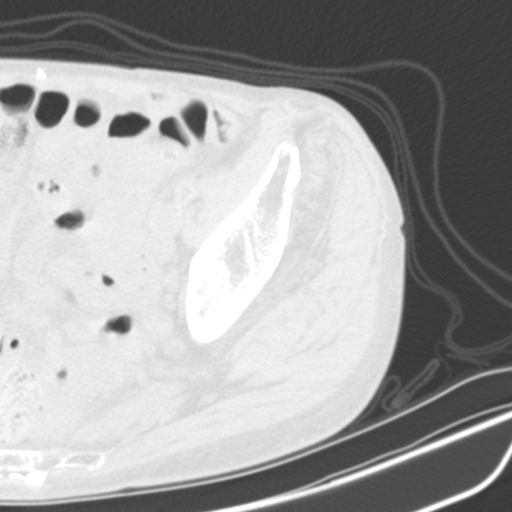
[im 84/87  lung]
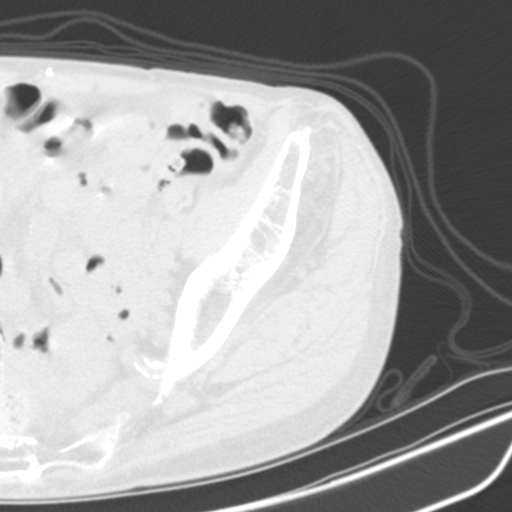

[16 of 32 positions shown; findings below may reference images not displayed]

FINDINGS: Bones/Joint/Cartilage

Left total hip arthroplasty without hardware failure or
complication. Left proximal femoral fracture along the stem
component of the left hip arthroplasty with the fracture terminating
at the tip of the femoral stem component.

No hip dislocation. Normal alignment. No aggressive lytic or
sclerotic osseous lesion.

Muscles

No muscle atrophy.

Soft tissue
No fluid collection or hematoma. No soft tissue mass. Peripheral
vascular atherosclerotic disease.
IMPRESSION: Left total hip arthroplasty with a left proximal femoral fracture
along the stem component of the left hip arthroplasty with the
fracture terminating at the tip of the femoral stem component.

## 2017-07-12 LAB — CUP PACEART REMOTE DEVICE CHECK
Brady Statistic RA Percent Paced: 96 %
Brady Statistic RV Percent Paced: 4 %
Implantable Lead Implant Date: 20130729
Implantable Lead Implant Date: 20130729
Implantable Lead Location: 753859
Implantable Lead Serial Number: 466820
Implantable Lead Serial Number: 522401
Lead Channel Impedance Value: 371 Ohm
Lead Channel Impedance Value: 675 Ohm
Lead Channel Pacing Threshold Amplitude: 0.7 V
Lead Channel Pacing Threshold Amplitude: 0.9 V
Lead Channel Pacing Threshold Pulse Width: 0.4 ms
Lead Channel Pacing Threshold Pulse Width: 0.4 ms
Lead Channel Setting Pacing Amplitude: 1.4 V
Lead Channel Setting Sensing Sensitivity: 1 mV
MDC IDC LEAD LOCATION: 753860
MDC IDC MSMT BATTERY REMAINING LONGEVITY: 30 mo
MDC IDC MSMT BATTERY REMAINING PERCENTAGE: 51 %
MDC IDC PG IMPLANT DT: 20130729
MDC IDC SESS DTM: 20190514192900
MDC IDC SET LEADCHNL RA PACING AMPLITUDE: 2.6 V
MDC IDC SET LEADCHNL RV PACING PULSEWIDTH: 0.4 ms
Pulse Gen Serial Number: 131216

## 2017-07-13 DIAGNOSIS — Z7982 Long term (current) use of aspirin: Secondary | ICD-10-CM | POA: Diagnosis not present

## 2017-07-13 DIAGNOSIS — I4891 Unspecified atrial fibrillation: Secondary | ICD-10-CM | POA: Diagnosis not present

## 2017-07-13 DIAGNOSIS — E039 Hypothyroidism, unspecified: Secondary | ICD-10-CM | POA: Diagnosis not present

## 2017-07-13 DIAGNOSIS — M6281 Muscle weakness (generalized): Secondary | ICD-10-CM | POA: Diagnosis not present

## 2017-07-13 DIAGNOSIS — K5900 Constipation, unspecified: Secondary | ICD-10-CM | POA: Diagnosis not present

## 2017-07-13 DIAGNOSIS — K922 Gastrointestinal hemorrhage, unspecified: Secondary | ICD-10-CM | POA: Diagnosis not present

## 2017-07-13 DIAGNOSIS — D500 Iron deficiency anemia secondary to blood loss (chronic): Secondary | ICD-10-CM | POA: Diagnosis not present

## 2017-07-13 DIAGNOSIS — I495 Sick sinus syndrome: Secondary | ICD-10-CM | POA: Diagnosis not present

## 2017-07-13 DIAGNOSIS — E46 Unspecified protein-calorie malnutrition: Secondary | ICD-10-CM | POA: Diagnosis not present

## 2017-07-18 DIAGNOSIS — E039 Hypothyroidism, unspecified: Secondary | ICD-10-CM | POA: Diagnosis not present

## 2017-07-18 DIAGNOSIS — D649 Anemia, unspecified: Secondary | ICD-10-CM | POA: Diagnosis not present

## 2017-07-18 DIAGNOSIS — E0590 Thyrotoxicosis, unspecified without thyrotoxic crisis or storm: Secondary | ICD-10-CM | POA: Diagnosis not present

## 2017-07-19 DIAGNOSIS — K922 Gastrointestinal hemorrhage, unspecified: Secondary | ICD-10-CM | POA: Diagnosis not present

## 2017-07-19 DIAGNOSIS — D500 Iron deficiency anemia secondary to blood loss (chronic): Secondary | ICD-10-CM | POA: Diagnosis not present

## 2017-07-19 DIAGNOSIS — R634 Abnormal weight loss: Secondary | ICD-10-CM | POA: Diagnosis not present

## 2017-07-19 DIAGNOSIS — K5900 Constipation, unspecified: Secondary | ICD-10-CM | POA: Diagnosis not present

## 2017-07-19 DIAGNOSIS — E039 Hypothyroidism, unspecified: Secondary | ICD-10-CM | POA: Diagnosis not present

## 2017-07-19 DIAGNOSIS — I4891 Unspecified atrial fibrillation: Secondary | ICD-10-CM | POA: Diagnosis not present

## 2017-07-19 DIAGNOSIS — I495 Sick sinus syndrome: Secondary | ICD-10-CM | POA: Diagnosis not present

## 2017-07-19 DIAGNOSIS — M6281 Muscle weakness (generalized): Secondary | ICD-10-CM | POA: Diagnosis not present

## 2017-07-20 DIAGNOSIS — D500 Iron deficiency anemia secondary to blood loss (chronic): Secondary | ICD-10-CM | POA: Diagnosis not present

## 2017-07-21 DIAGNOSIS — K922 Gastrointestinal hemorrhage, unspecified: Secondary | ICD-10-CM | POA: Diagnosis not present

## 2017-07-21 DIAGNOSIS — D500 Iron deficiency anemia secondary to blood loss (chronic): Secondary | ICD-10-CM | POA: Diagnosis not present

## 2017-07-25 DIAGNOSIS — E039 Hypothyroidism, unspecified: Secondary | ICD-10-CM | POA: Diagnosis not present

## 2017-07-25 DIAGNOSIS — D649 Anemia, unspecified: Secondary | ICD-10-CM | POA: Diagnosis not present

## 2017-07-25 DIAGNOSIS — K922 Gastrointestinal hemorrhage, unspecified: Secondary | ICD-10-CM | POA: Diagnosis not present

## 2017-08-16 DIAGNOSIS — K5900 Constipation, unspecified: Secondary | ICD-10-CM | POA: Diagnosis not present

## 2017-08-16 DIAGNOSIS — I495 Sick sinus syndrome: Secondary | ICD-10-CM | POA: Diagnosis not present

## 2017-08-16 DIAGNOSIS — D500 Iron deficiency anemia secondary to blood loss (chronic): Secondary | ICD-10-CM | POA: Diagnosis not present

## 2017-08-16 DIAGNOSIS — R634 Abnormal weight loss: Secondary | ICD-10-CM | POA: Diagnosis not present

## 2017-08-16 DIAGNOSIS — E039 Hypothyroidism, unspecified: Secondary | ICD-10-CM | POA: Diagnosis not present

## 2017-08-16 DIAGNOSIS — Z7982 Long term (current) use of aspirin: Secondary | ICD-10-CM | POA: Diagnosis not present

## 2017-08-16 DIAGNOSIS — M6281 Muscle weakness (generalized): Secondary | ICD-10-CM | POA: Diagnosis not present

## 2017-08-16 DIAGNOSIS — K922 Gastrointestinal hemorrhage, unspecified: Secondary | ICD-10-CM | POA: Diagnosis not present

## 2017-08-16 DIAGNOSIS — I4891 Unspecified atrial fibrillation: Secondary | ICD-10-CM | POA: Diagnosis not present

## 2017-08-16 DIAGNOSIS — N189 Chronic kidney disease, unspecified: Secondary | ICD-10-CM | POA: Diagnosis not present

## 2017-08-19 LAB — CUP PACEART INCLINIC DEVICE CHECK
Implantable Lead Implant Date: 20130729
Implantable Lead Location: 753860
Implantable Lead Model: 4479
Implantable Lead Serial Number: 466820
Implantable Lead Serial Number: 522401
Lead Channel Setting Pacing Amplitude: 1.4 V
Lead Channel Setting Pacing Amplitude: 2.6 V
Lead Channel Setting Pacing Pulse Width: 0.4 ms
MDC IDC LEAD IMPLANT DT: 20130729
MDC IDC LEAD LOCATION: 753859
MDC IDC PG IMPLANT DT: 20130729
MDC IDC SESS DTM: 20190705150201
MDC IDC SET LEADCHNL RV SENSING SENSITIVITY: 1 mV
Pulse Gen Serial Number: 131216

## 2017-08-29 DIAGNOSIS — D509 Iron deficiency anemia, unspecified: Secondary | ICD-10-CM | POA: Diagnosis not present

## 2017-09-15 DIAGNOSIS — E039 Hypothyroidism, unspecified: Secondary | ICD-10-CM | POA: Diagnosis not present

## 2017-09-15 DIAGNOSIS — M6281 Muscle weakness (generalized): Secondary | ICD-10-CM | POA: Diagnosis not present

## 2017-09-15 DIAGNOSIS — D500 Iron deficiency anemia secondary to blood loss (chronic): Secondary | ICD-10-CM | POA: Diagnosis not present

## 2017-09-15 DIAGNOSIS — I495 Sick sinus syndrome: Secondary | ICD-10-CM | POA: Diagnosis not present

## 2017-09-15 DIAGNOSIS — I4891 Unspecified atrial fibrillation: Secondary | ICD-10-CM | POA: Diagnosis not present

## 2017-09-15 DIAGNOSIS — R634 Abnormal weight loss: Secondary | ICD-10-CM | POA: Diagnosis not present

## 2017-09-15 DIAGNOSIS — K5900 Constipation, unspecified: Secondary | ICD-10-CM | POA: Diagnosis not present

## 2017-09-15 DIAGNOSIS — K922 Gastrointestinal hemorrhage, unspecified: Secondary | ICD-10-CM | POA: Diagnosis not present

## 2017-09-15 DIAGNOSIS — N189 Chronic kidney disease, unspecified: Secondary | ICD-10-CM | POA: Diagnosis not present

## 2017-09-26 DIAGNOSIS — E039 Hypothyroidism, unspecified: Secondary | ICD-10-CM | POA: Diagnosis not present

## 2017-09-28 ENCOUNTER — Ambulatory Visit (INDEPENDENT_AMBULATORY_CARE_PROVIDER_SITE_OTHER): Payer: Medicare Other | Admitting: *Deleted

## 2017-09-28 ENCOUNTER — Telehealth: Payer: Self-pay | Admitting: Cardiology

## 2017-09-28 DIAGNOSIS — I495 Sick sinus syndrome: Secondary | ICD-10-CM | POA: Diagnosis not present

## 2017-09-28 NOTE — Telephone Encounter (Signed)
LMOVM reminding pt to send remote transmission.   

## 2017-09-29 ENCOUNTER — Encounter: Payer: Self-pay | Admitting: Cardiology

## 2017-09-29 NOTE — Progress Notes (Signed)
Remote pacemaker transmission.   

## 2017-10-03 DIAGNOSIS — D500 Iron deficiency anemia secondary to blood loss (chronic): Secondary | ICD-10-CM | POA: Diagnosis not present

## 2017-10-03 DIAGNOSIS — N189 Chronic kidney disease, unspecified: Secondary | ICD-10-CM | POA: Diagnosis not present

## 2017-10-03 DIAGNOSIS — I495 Sick sinus syndrome: Secondary | ICD-10-CM | POA: Diagnosis not present

## 2017-10-03 DIAGNOSIS — K5900 Constipation, unspecified: Secondary | ICD-10-CM | POA: Diagnosis not present

## 2017-10-03 DIAGNOSIS — I4891 Unspecified atrial fibrillation: Secondary | ICD-10-CM | POA: Diagnosis not present

## 2017-10-03 DIAGNOSIS — K922 Gastrointestinal hemorrhage, unspecified: Secondary | ICD-10-CM | POA: Diagnosis not present

## 2017-10-03 DIAGNOSIS — M6281 Muscle weakness (generalized): Secondary | ICD-10-CM | POA: Diagnosis not present

## 2017-10-03 DIAGNOSIS — E039 Hypothyroidism, unspecified: Secondary | ICD-10-CM | POA: Diagnosis not present

## 2017-10-12 DIAGNOSIS — F419 Anxiety disorder, unspecified: Secondary | ICD-10-CM | POA: Diagnosis not present

## 2017-10-12 DIAGNOSIS — F339 Major depressive disorder, recurrent, unspecified: Secondary | ICD-10-CM | POA: Diagnosis not present

## 2017-10-24 LAB — CUP PACEART REMOTE DEVICE CHECK
Implantable Lead Implant Date: 20130729
Implantable Lead Location: 753860
Implantable Lead Model: 4456
Implantable Lead Model: 4479
Implantable Pulse Generator Implant Date: 20130729
MDC IDC LEAD IMPLANT DT: 20130729
MDC IDC LEAD LOCATION: 753859
MDC IDC LEAD SERIAL: 466820
MDC IDC LEAD SERIAL: 522401
MDC IDC SESS DTM: 20190909145718
Pulse Gen Serial Number: 131216

## 2017-10-26 DIAGNOSIS — K922 Gastrointestinal hemorrhage, unspecified: Secondary | ICD-10-CM | POA: Diagnosis not present

## 2017-10-26 DIAGNOSIS — R2681 Unsteadiness on feet: Secondary | ICD-10-CM | POA: Diagnosis not present

## 2017-10-26 DIAGNOSIS — K5900 Constipation, unspecified: Secondary | ICD-10-CM | POA: Diagnosis not present

## 2017-10-26 DIAGNOSIS — N189 Chronic kidney disease, unspecified: Secondary | ICD-10-CM | POA: Diagnosis not present

## 2017-10-26 DIAGNOSIS — I4891 Unspecified atrial fibrillation: Secondary | ICD-10-CM | POA: Diagnosis not present

## 2017-10-26 DIAGNOSIS — M6281 Muscle weakness (generalized): Secondary | ICD-10-CM | POA: Diagnosis not present

## 2017-10-26 DIAGNOSIS — D500 Iron deficiency anemia secondary to blood loss (chronic): Secondary | ICD-10-CM | POA: Diagnosis not present

## 2017-10-26 DIAGNOSIS — I495 Sick sinus syndrome: Secondary | ICD-10-CM | POA: Diagnosis not present

## 2017-10-26 DIAGNOSIS — E039 Hypothyroidism, unspecified: Secondary | ICD-10-CM | POA: Diagnosis not present

## 2017-10-26 DIAGNOSIS — F0390 Unspecified dementia without behavioral disturbance: Secondary | ICD-10-CM | POA: Diagnosis not present

## 2017-10-26 DIAGNOSIS — G3184 Mild cognitive impairment, so stated: Secondary | ICD-10-CM | POA: Diagnosis not present

## 2017-10-31 DIAGNOSIS — E039 Hypothyroidism, unspecified: Secondary | ICD-10-CM | POA: Diagnosis not present

## 2017-11-07 DIAGNOSIS — R42 Dizziness and giddiness: Secondary | ICD-10-CM | POA: Diagnosis not present

## 2017-11-10 ENCOUNTER — Telehealth: Payer: Self-pay | Admitting: Cardiology

## 2017-11-10 NOTE — Telephone Encounter (Signed)
Patient nurse called and stated that the [facility] facility wants patient PPM checked b/c she has been dizzy. Instructed pt nurse to send a manual transmission w/ pt home monitor. Pt nurse verbalized understanding.

## 2017-11-10 NOTE — Telephone Encounter (Signed)
Returned call to [employer] as transmission has not yet been received. Phone rang continuously, no answer, no VM.

## 2017-11-10 NOTE — Telephone Encounter (Signed)
Transmission received and reviewed. Spoke with [name], Midwife at [facility]. Advised no abnormalities noted on transmission. Presenting rhythm Ap/Vs @ 74bpm. Lead trends stable. Estimated longevity 2.5 years. Ap 97%, Vp 3%. No arrhythmia episodes since last remote transmission on 09/28/17. [name] verbalizes understanding and denies additional questions at this time.

## 2017-12-02 DIAGNOSIS — E039 Hypothyroidism, unspecified: Secondary | ICD-10-CM | POA: Diagnosis not present

## 2017-12-28 ENCOUNTER — Ambulatory Visit (INDEPENDENT_AMBULATORY_CARE_PROVIDER_SITE_OTHER): Payer: Medicare Other | Admitting: *Deleted

## 2017-12-28 ENCOUNTER — Telehealth: Payer: Self-pay | Admitting: Cardiology

## 2017-12-28 DIAGNOSIS — I495 Sick sinus syndrome: Secondary | ICD-10-CM | POA: Diagnosis not present

## 2017-12-28 NOTE — Telephone Encounter (Signed)
LMOVM reminding pt to send remote transmission.   

## 2017-12-29 NOTE — Progress Notes (Signed)
Remote pacemaker transmission.   

## 2018-01-16 DIAGNOSIS — E039 Hypothyroidism, unspecified: Secondary | ICD-10-CM | POA: Diagnosis not present

## 2018-01-25 DIAGNOSIS — I4891 Unspecified atrial fibrillation: Secondary | ICD-10-CM | POA: Diagnosis not present

## 2018-01-25 DIAGNOSIS — R2681 Unsteadiness on feet: Secondary | ICD-10-CM | POA: Diagnosis not present

## 2018-01-25 DIAGNOSIS — I495 Sick sinus syndrome: Secondary | ICD-10-CM | POA: Diagnosis not present

## 2018-01-25 DIAGNOSIS — K5900 Constipation, unspecified: Secondary | ICD-10-CM | POA: Diagnosis not present

## 2018-01-25 DIAGNOSIS — F0390 Unspecified dementia without behavioral disturbance: Secondary | ICD-10-CM | POA: Diagnosis not present

## 2018-01-25 DIAGNOSIS — N189 Chronic kidney disease, unspecified: Secondary | ICD-10-CM | POA: Diagnosis not present

## 2018-01-25 DIAGNOSIS — D500 Iron deficiency anemia secondary to blood loss (chronic): Secondary | ICD-10-CM | POA: Diagnosis not present

## 2018-01-25 DIAGNOSIS — K922 Gastrointestinal hemorrhage, unspecified: Secondary | ICD-10-CM | POA: Diagnosis not present

## 2018-01-25 DIAGNOSIS — G3184 Mild cognitive impairment, so stated: Secondary | ICD-10-CM | POA: Diagnosis not present

## 2018-01-25 DIAGNOSIS — E039 Hypothyroidism, unspecified: Secondary | ICD-10-CM | POA: Diagnosis not present

## 2018-01-25 DIAGNOSIS — M6281 Muscle weakness (generalized): Secondary | ICD-10-CM | POA: Diagnosis not present

## 2018-02-02 DIAGNOSIS — L089 Local infection of the skin and subcutaneous tissue, unspecified: Secondary | ICD-10-CM | POA: Diagnosis not present

## 2018-02-18 DIAGNOSIS — E039 Hypothyroidism, unspecified: Secondary | ICD-10-CM | POA: Diagnosis not present

## 2018-02-21 DIAGNOSIS — N189 Chronic kidney disease, unspecified: Secondary | ICD-10-CM | POA: Diagnosis not present

## 2018-02-21 DIAGNOSIS — I495 Sick sinus syndrome: Secondary | ICD-10-CM | POA: Diagnosis not present

## 2018-02-21 DIAGNOSIS — I4891 Unspecified atrial fibrillation: Secondary | ICD-10-CM | POA: Diagnosis not present

## 2018-02-21 DIAGNOSIS — M6281 Muscle weakness (generalized): Secondary | ICD-10-CM | POA: Diagnosis not present

## 2018-02-21 DIAGNOSIS — R54 Age-related physical debility: Secondary | ICD-10-CM | POA: Diagnosis not present

## 2018-02-21 DIAGNOSIS — K922 Gastrointestinal hemorrhage, unspecified: Secondary | ICD-10-CM | POA: Diagnosis not present

## 2018-02-21 DIAGNOSIS — F0390 Unspecified dementia without behavioral disturbance: Secondary | ICD-10-CM | POA: Diagnosis not present

## 2018-02-21 DIAGNOSIS — E039 Hypothyroidism, unspecified: Secondary | ICD-10-CM | POA: Diagnosis not present

## 2018-02-21 DIAGNOSIS — K5900 Constipation, unspecified: Secondary | ICD-10-CM | POA: Diagnosis not present

## 2018-02-21 DIAGNOSIS — D500 Iron deficiency anemia secondary to blood loss (chronic): Secondary | ICD-10-CM | POA: Diagnosis not present

## 2018-02-21 DIAGNOSIS — G3184 Mild cognitive impairment, so stated: Secondary | ICD-10-CM | POA: Diagnosis not present

## 2018-02-22 DIAGNOSIS — E039 Hypothyroidism, unspecified: Secondary | ICD-10-CM | POA: Diagnosis not present

## 2018-02-22 DIAGNOSIS — Z0000 Encounter for general adult medical examination without abnormal findings: Secondary | ICD-10-CM | POA: Diagnosis not present

## 2018-02-26 LAB — CUP PACEART REMOTE DEVICE CHECK
Battery Remaining Longevity: 24 mo
Battery Remaining Percentage: 43 %
Brady Statistic RA Percent Paced: 97 %
Implantable Lead Implant Date: 20130729
Implantable Lead Location: 753859
Implantable Lead Location: 753860
Implantable Lead Model: 4456
Implantable Lead Model: 4479
Lead Channel Impedance Value: 691 Ohm
Lead Channel Pacing Threshold Amplitude: 1 V
Lead Channel Setting Pacing Amplitude: 1.5 V
Lead Channel Setting Pacing Amplitude: 2.6 V
Lead Channel Setting Pacing Pulse Width: 0.4 ms
MDC IDC LEAD IMPLANT DT: 20130729
MDC IDC LEAD SERIAL: 466820
MDC IDC LEAD SERIAL: 522401
MDC IDC MSMT LEADCHNL RA PACING THRESHOLD AMPLITUDE: 0.7 V
MDC IDC MSMT LEADCHNL RA PACING THRESHOLD PULSEWIDTH: 0.4 ms
MDC IDC MSMT LEADCHNL RV IMPEDANCE VALUE: 380 Ohm
MDC IDC MSMT LEADCHNL RV PACING THRESHOLD PULSEWIDTH: 0.4 ms
MDC IDC PG IMPLANT DT: 20130729
MDC IDC SESS DTM: 20191113200200
MDC IDC SET LEADCHNL RV SENSING SENSITIVITY: 1 mV
MDC IDC STAT BRADY RV PERCENT PACED: 3 %
Pulse Gen Serial Number: 131216

## 2018-03-06 ENCOUNTER — Telehealth: Payer: Self-pay | Admitting: Cardiology

## 2018-03-06 DIAGNOSIS — S41011A Laceration without foreign body of right shoulder, initial encounter: Secondary | ICD-10-CM | POA: Diagnosis not present

## 2018-03-06 DIAGNOSIS — I447 Left bundle-branch block, unspecified: Secondary | ICD-10-CM | POA: Diagnosis not present

## 2018-03-06 DIAGNOSIS — R296 Repeated falls: Secondary | ICD-10-CM | POA: Diagnosis not present

## 2018-03-06 NOTE — Telephone Encounter (Signed)
Spoke with [name], nurse supervisor at pt's SNF. She is aware of presenting rhythm. She states she will call pt's NP and alert her as well. No additional questions.

## 2018-03-06 NOTE — Telephone Encounter (Signed)
Manual transmission received and reviewed. Presenting rhythm As/Vs @ 129bpm. No high ventricular rates or recent atrial arrhythmia episodes. Lead trends stable. Ap 97%, Vp 3%.

## 2018-03-06 NOTE — Telephone Encounter (Signed)
Patient nurse called and stated that patient had a syncopal episode and hit her head today. She stated that the NP at the facility wants to have pt device checked. Instructed her how to send a manual transmission. She stated that she would send it.

## 2018-03-07 ENCOUNTER — Telehealth: Payer: Self-pay | Admitting: Cardiovascular Disease

## 2018-03-07 DIAGNOSIS — E559 Vitamin D deficiency, unspecified: Secondary | ICD-10-CM | POA: Diagnosis not present

## 2018-03-07 DIAGNOSIS — E039 Hypothyroidism, unspecified: Secondary | ICD-10-CM | POA: Diagnosis not present

## 2018-03-07 DIAGNOSIS — R55 Syncope and collapse: Secondary | ICD-10-CM | POA: Diagnosis not present

## 2018-03-07 DIAGNOSIS — D519 Vitamin B12 deficiency anemia, unspecified: Secondary | ICD-10-CM | POA: Diagnosis not present

## 2018-03-07 DIAGNOSIS — I10 Essential (primary) hypertension: Secondary | ICD-10-CM | POA: Diagnosis not present

## 2018-03-07 DIAGNOSIS — D649 Anemia, unspecified: Secondary | ICD-10-CM | POA: Diagnosis not present

## 2018-03-07 NOTE — Telephone Encounter (Signed)
New Message          Patient's daughter in law is calling to find out about her mother in law plan to bring her heart rate down. She would like to know options.

## 2018-03-07 NOTE — Telephone Encounter (Signed)
Returned pt daughter in Sports coach call Jamie Daugherty. DPR on file. Made Jamie Daugherty aware of the device clinics documentation yesterday. Adv her that that the pt device transmission was received and [name] the nursing supervisor at the pt SNF is aware of the pt underlying rhythm and was going to alert the facilities on staff NP. Recommended that [name] f/u with the SNF regarding the plan of care. Jamie Daugherty voiced appreciation for the call back.

## 2018-03-08 NOTE — Telephone Encounter (Signed)
Called patient's daughter. No pacemaker related problem that would explain syncope. Advised TSH check with next blood draw.

## 2018-03-25 DIAGNOSIS — E559 Vitamin D deficiency, unspecified: Secondary | ICD-10-CM | POA: Diagnosis not present

## 2018-03-29 ENCOUNTER — Ambulatory Visit (INDEPENDENT_AMBULATORY_CARE_PROVIDER_SITE_OTHER): Payer: Medicare Other

## 2018-03-29 DIAGNOSIS — I495 Sick sinus syndrome: Secondary | ICD-10-CM | POA: Diagnosis not present

## 2018-03-29 DIAGNOSIS — R55 Syncope and collapse: Secondary | ICD-10-CM

## 2018-04-10 LAB — CUP PACEART REMOTE DEVICE CHECK
Battery Remaining Percentage: 36 %
Date Time Interrogation Session: 20200213181300
Implantable Lead Implant Date: 20130729
Implantable Lead Location: 753859
Implantable Lead Serial Number: 466820
Implantable Lead Serial Number: 522401
Implantable Pulse Generator Implant Date: 20130729
Lead Channel Impedance Value: 330 Ohm
Lead Channel Pacing Threshold Amplitude: 0.7 V
Lead Channel Pacing Threshold Amplitude: 1 V
Lead Channel Pacing Threshold Pulse Width: 0.4 ms
MDC IDC LEAD IMPLANT DT: 20130729
MDC IDC LEAD LOCATION: 753860
MDC IDC MSMT BATTERY REMAINING LONGEVITY: 24 mo
MDC IDC MSMT LEADCHNL RA IMPEDANCE VALUE: 626 Ohm
MDC IDC MSMT LEADCHNL RV PACING THRESHOLD PULSEWIDTH: 0.4 ms
MDC IDC SET LEADCHNL RA PACING AMPLITUDE: 2.6 V
MDC IDC SET LEADCHNL RV PACING AMPLITUDE: 1.5 V
MDC IDC SET LEADCHNL RV PACING PULSEWIDTH: 0.4 ms
MDC IDC SET LEADCHNL RV SENSING SENSITIVITY: 1 mV
MDC IDC STAT BRADY RA PERCENT PACED: 97 %
MDC IDC STAT BRADY RV PERCENT PACED: 3 %
Pulse Gen Serial Number: 131216

## 2018-04-11 NOTE — Progress Notes (Signed)
Remote pacemaker transmission.   

## 2018-04-13 DIAGNOSIS — R54 Age-related physical debility: Secondary | ICD-10-CM | POA: Diagnosis not present

## 2018-04-13 DIAGNOSIS — I495 Sick sinus syndrome: Secondary | ICD-10-CM | POA: Diagnosis not present

## 2018-04-13 DIAGNOSIS — D500 Iron deficiency anemia secondary to blood loss (chronic): Secondary | ICD-10-CM | POA: Diagnosis not present

## 2018-04-13 DIAGNOSIS — I4891 Unspecified atrial fibrillation: Secondary | ICD-10-CM | POA: Diagnosis not present

## 2018-04-13 DIAGNOSIS — K5900 Constipation, unspecified: Secondary | ICD-10-CM | POA: Diagnosis not present

## 2018-04-13 DIAGNOSIS — E039 Hypothyroidism, unspecified: Secondary | ICD-10-CM | POA: Diagnosis not present

## 2018-04-13 DIAGNOSIS — F0390 Unspecified dementia without behavioral disturbance: Secondary | ICD-10-CM | POA: Diagnosis not present

## 2018-04-13 DIAGNOSIS — M6281 Muscle weakness (generalized): Secondary | ICD-10-CM | POA: Diagnosis not present

## 2018-04-13 DIAGNOSIS — E559 Vitamin D deficiency, unspecified: Secondary | ICD-10-CM | POA: Diagnosis not present

## 2018-04-19 DIAGNOSIS — D649 Anemia, unspecified: Secondary | ICD-10-CM | POA: Diagnosis not present

## 2018-04-19 DIAGNOSIS — D508 Other iron deficiency anemias: Secondary | ICD-10-CM | POA: Diagnosis not present

## 2018-04-19 DIAGNOSIS — E559 Vitamin D deficiency, unspecified: Secondary | ICD-10-CM | POA: Diagnosis not present

## 2018-04-21 IMAGING — DX DG CHEST 1V PORT
1 series · 1 of 1 positions shown · non-contrast
Comparison: PA and lateral chest x-ray August 02, 2015

CLINICAL DATA: Gastrointestinal bleeding, history of atrial
fibrillation and hypertension

EXAM:
PORTABLE CHEST 1 VIEW

[chest ap]
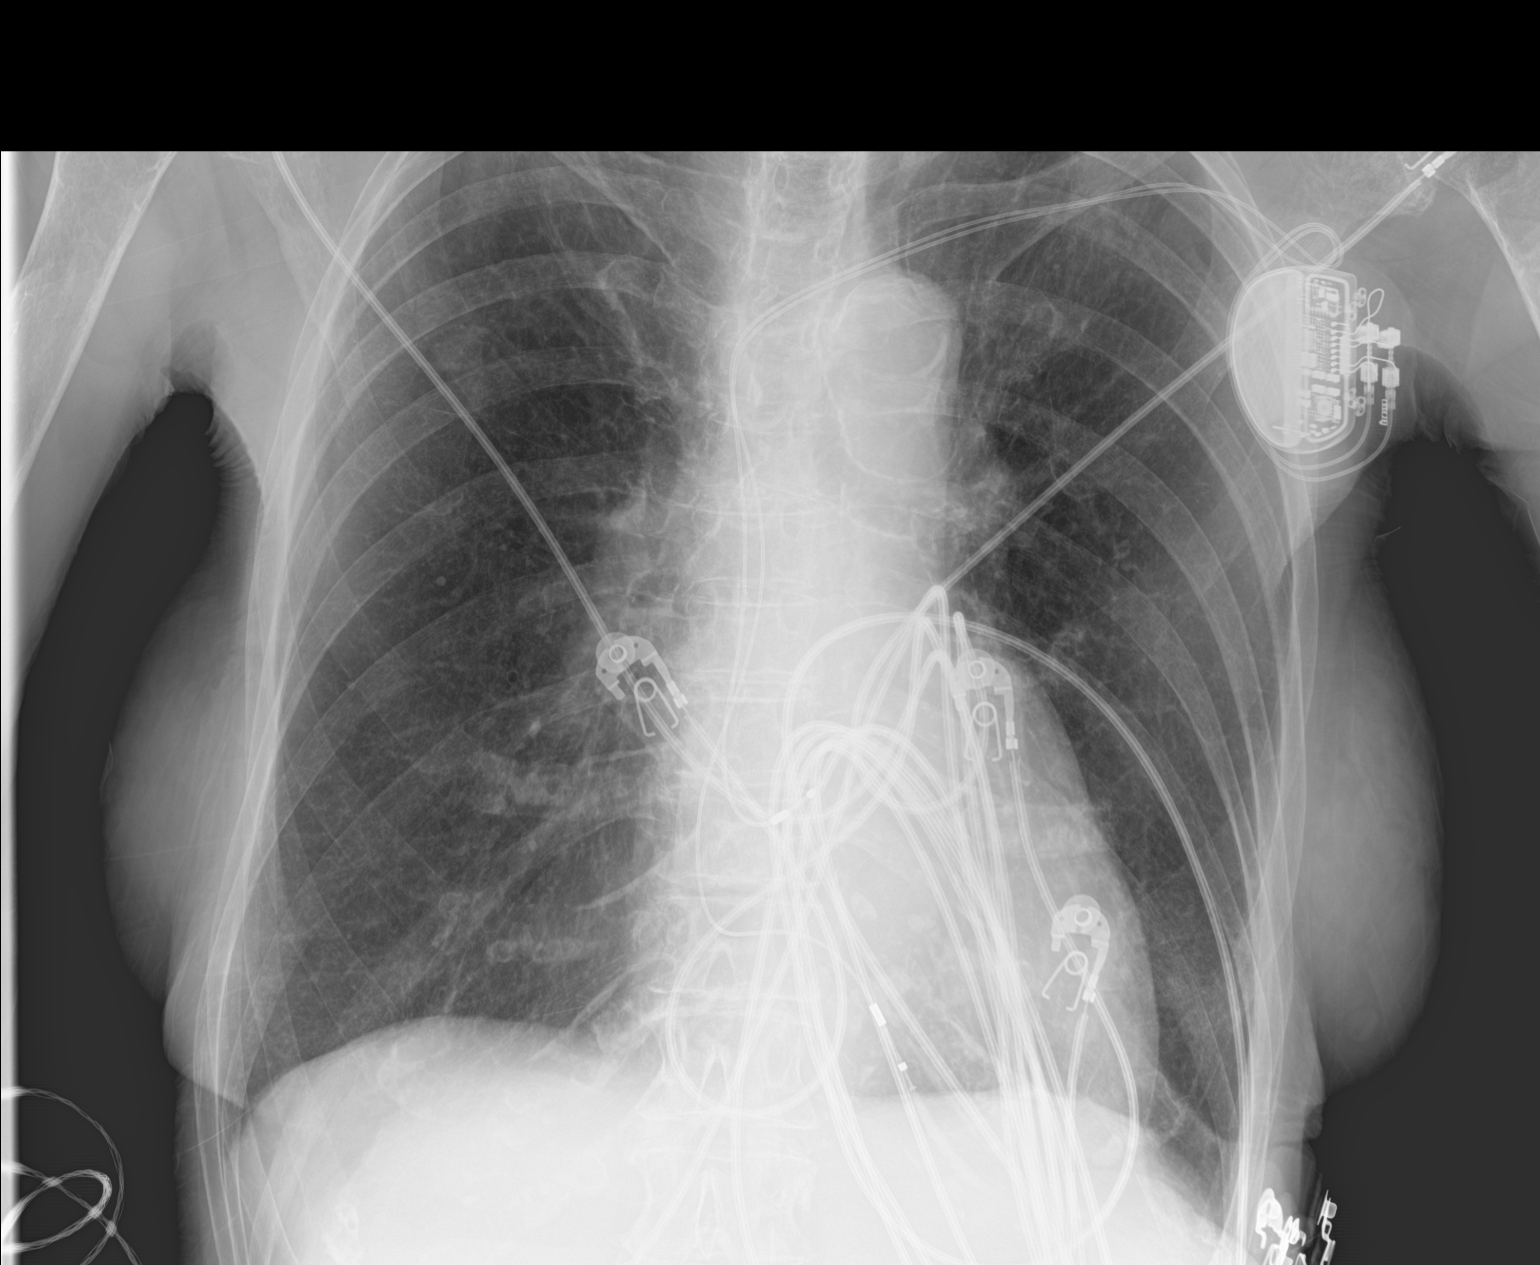

[1 of 1 positions shown; findings below may reference images not displayed]

FINDINGS: The lungs remain hyperinflated. There is stable density in the right
pulmonary apex. There is no pleural effusion or pneumothorax. The
heart and pulmonary vascularity are normal. The ICD is in stable
position. There is calcification in the wall of the aortic arch.
IMPRESSION: COPD.  Right apical scarring.  No pneumonia nor pulmonary edema.

Thoracic aortic atherosclerosis.

## 2018-05-20 DIAGNOSIS — R946 Abnormal results of thyroid function studies: Secondary | ICD-10-CM | POA: Diagnosis not present

## 2018-05-20 DIAGNOSIS — E039 Hypothyroidism, unspecified: Secondary | ICD-10-CM | POA: Diagnosis not present

## 2018-06-06 DIAGNOSIS — R54 Age-related physical debility: Secondary | ICD-10-CM | POA: Diagnosis not present

## 2018-06-06 DIAGNOSIS — F0390 Unspecified dementia without behavioral disturbance: Secondary | ICD-10-CM | POA: Diagnosis not present

## 2018-06-06 DIAGNOSIS — K5900 Constipation, unspecified: Secondary | ICD-10-CM | POA: Diagnosis not present

## 2018-06-06 DIAGNOSIS — M6281 Muscle weakness (generalized): Secondary | ICD-10-CM | POA: Diagnosis not present

## 2018-06-06 DIAGNOSIS — D649 Anemia, unspecified: Secondary | ICD-10-CM | POA: Diagnosis not present

## 2018-06-06 DIAGNOSIS — I495 Sick sinus syndrome: Secondary | ICD-10-CM | POA: Diagnosis not present

## 2018-06-06 DIAGNOSIS — E039 Hypothyroidism, unspecified: Secondary | ICD-10-CM | POA: Diagnosis not present

## 2018-06-06 DIAGNOSIS — I4891 Unspecified atrial fibrillation: Secondary | ICD-10-CM | POA: Diagnosis not present

## 2018-06-06 DIAGNOSIS — E876 Hypokalemia: Secondary | ICD-10-CM | POA: Diagnosis not present

## 2018-06-06 DIAGNOSIS — E559 Vitamin D deficiency, unspecified: Secondary | ICD-10-CM | POA: Diagnosis not present

## 2018-06-06 DIAGNOSIS — R630 Anorexia: Secondary | ICD-10-CM | POA: Diagnosis not present

## 2018-06-07 DIAGNOSIS — R4182 Altered mental status, unspecified: Secondary | ICD-10-CM | POA: Diagnosis not present

## 2018-06-08 DIAGNOSIS — R319 Hematuria, unspecified: Secondary | ICD-10-CM | POA: Diagnosis not present

## 2018-06-08 DIAGNOSIS — D649 Anemia, unspecified: Secondary | ICD-10-CM | POA: Diagnosis not present

## 2018-06-08 DIAGNOSIS — N390 Urinary tract infection, site not specified: Secondary | ICD-10-CM | POA: Diagnosis not present

## 2018-06-20 ENCOUNTER — Telehealth: Payer: Self-pay | Admitting: Cardiovascular Disease

## 2018-06-21 ENCOUNTER — Telehealth (INDEPENDENT_AMBULATORY_CARE_PROVIDER_SITE_OTHER): Payer: Medicare Other | Admitting: Cardiovascular Disease

## 2018-06-21 ENCOUNTER — Encounter: Payer: Self-pay | Admitting: Cardiovascular Disease

## 2018-06-21 ENCOUNTER — Telehealth: Payer: Self-pay

## 2018-06-21 DIAGNOSIS — E43 Unspecified severe protein-calorie malnutrition: Secondary | ICD-10-CM | POA: Diagnosis not present

## 2018-06-21 DIAGNOSIS — Z950 Presence of cardiac pacemaker: Secondary | ICD-10-CM | POA: Diagnosis not present

## 2018-06-21 DIAGNOSIS — I495 Sick sinus syndrome: Secondary | ICD-10-CM

## 2018-06-21 DIAGNOSIS — I10 Essential (primary) hypertension: Secondary | ICD-10-CM | POA: Diagnosis not present

## 2018-06-21 DIAGNOSIS — I482 Chronic atrial fibrillation, unspecified: Secondary | ICD-10-CM

## 2018-06-21 DIAGNOSIS — I951 Orthostatic hypotension: Secondary | ICD-10-CM | POA: Diagnosis not present

## 2018-06-21 DIAGNOSIS — F0390 Unspecified dementia without behavioral disturbance: Secondary | ICD-10-CM | POA: Diagnosis not present

## 2018-06-21 DIAGNOSIS — I480 Paroxysmal atrial fibrillation: Secondary | ICD-10-CM

## 2018-06-21 DIAGNOSIS — I5022 Chronic systolic (congestive) heart failure: Secondary | ICD-10-CM | POA: Diagnosis not present

## 2018-06-21 NOTE — Patient Instructions (Signed)
Medication Instructions:  Your physician recommends that you continue on your current medications as directed. Please refer to the Current Medication list given to you today.  If you need a refill on your cardiac medications before your next appointment, please call your pharmacy.   Lab work: NONE If you have labs (blood work) drawn today and your tests are completely normal, you will receive your results only by: Marland Kitchen MyChart Message (if you have MyChart) OR . A paper copy in the mail If you have any lab test that is abnormal or we need to change your treatment, we will call you to review the results.  Testing/Procedures: NONE  Follow-Up: At [facility], you and your health needs are our priority.  As part of our continuing mission to provide you with exceptional heart care, we have created designated Provider Care Teams.  These Care Teams include your primary Cardiologist (physician) and Advanced Practice Providers (APPs -  Physician Assistants and Nurse Practitioners) who all work together to provide you with the care you need, when you need it. You will need a follow up appointment in 12 months.  Please call our office 2 months in advance to schedule this appointment.  You may see Dr. Sallyanne Kuster or one of the following Advanced Practice Providers on your designated Care Team: Almyra Deforest, [place] . Fabian Sharp, PA-C

## 2018-06-21 NOTE — Telephone Encounter (Signed)
DPR on file. Left detailed message with AVS instructions  Letter including After Visit Summary and any other necessary documents to be mailed to the patient's address on file.

## 2018-06-21 NOTE — Progress Notes (Signed)
Virtual Visit via Video Note   This visit type was conducted due to national recommendations for restrictions regarding the COVID-19 Pandemic (e.g. social distancing) in an effort to limit this patient's exposure and mitigate transmission in our community.  Due to her co-morbid illnesses, this patient is at least at moderate risk for complications without adequate follow up.  This format is felt to be most appropriate for this patient at this time.  All issues noted in this document were discussed and addressed.  A limited physical exam was performed with this format.  Please refer to the patient's chart for her consent to telehealth for [facility].   The nursing supervisor, [name], helped with the visit.  We had trouble with the video link but had no trouble with the audio connection.  Date:  06/21/2018   ID:  [name], DOB Dec 02, 1918, MRN [id number]  Patient Location: [place] Provider Location: Home  PCP:  Patient, No Pcp Per  Cardiologist:  Carli Lefevers Electrophysiologist:  None   Evaluation Performed:  Follow-Up Visit  Chief Complaint:  AFib, CHF, pacemaker  History of Present Illness:    Jamie Daugherty is a 85 y.o. female with with tachycardia related cardiomyopathy and systolic heart failure related to atrial fibrillation with rapid ventricular response, also with a history of tachycardia-bradycardia syndrome for which received a dual-chamber [employer] pacemaker in 2013.   Rate control was very difficult to achieve and heart failure could not  Be compensated until we started amiodarone.  Ever since that medication was initiated she has done remarkably well she has maintained atrial paced rhythm except for brief interruptions.  She is a resident at [facility] and has slowly worsening dementia, but seems to be comfortable, happy and has good quality of life.  Her only complaint is occasional dizziness but she has not had any falls.  She is known to  have orthostatic hypotension and is not on any blood pressure medications.  She has not had palpitations or syncope.  She denies shortness of breath or chest pain.  She eats very small quantities and continues to gradually lose weight.  She is clearly underweight at this point.  She had a normal set of liver function tests performed in March and on April 4 her TSH was 6.74, mildly elevated.  Pacemaker interrogation shows normal device function. She has 97% atrial pacing and only 3% ventricular pacing. Heart rate histogram distribution reflects her sedentary lifestyle.  She has very infrequent atrial fibrillation, a grand total of 39 minutes in the last 10 months.  Her device is a Chemical engineer ingenio implanted in 2013 and has roughly 2 years of anticipated generator longevity.  The patient does not have symptoms concerning for COVID-19 infection (fever, chills, cough, or new shortness of breath).    Past Medical History:  Diagnosis Date   Anemia    Arthritis    Atrial fibrillation ([place])    History of blood transfusion    Hypertension    Hypothyroidism    Pacemaker - dual chamber [facility] implanted 2013 01/25/2013   Syncope    Past Surgical History:  Procedure Laterality Date   ABDOMINAL HYSTERECTOMY     ANTERIOR APPROACH HEMI HIP ARTHROPLASTY Left 04/17/2015   Procedure: ANTERIOR APPROACH LEFT HEMI HIP ARTHROPLASTY;  Surgeon: Leandrew Koyanagi, MD;  Location: [place];  Service: Orthopedics;  Laterality: Left;   ORIF FEMUR FRACTURE Left 08/07/2015   Procedure: OPEN REDUCTION INTERNAL FIXATION (ORIF) LEFT  PERIPROSTHETIC FEMUR  FRACTURE;  Surgeon: Leandrew Koyanagi, MD;  Location: [place];  Service: Orthopedics;  Laterality: Left;   PACEMAKER INSERTION       Current Meds  Medication Sig   amiodarone (PACERONE) 200 MG tablet TAKE 1 TABLET BY MOUTH EVERY DAY   ergocalciferol (VITAMIN D2) 1.25 MG (50000 UT) capsule Take 50,000 Units by mouth once a week.   levothyroxine  (SYNTHROID) 50 MCG tablet Take 1 tablet by mouth daily.   NUTRITIONAL SUPPLEMENT LIQD Take 1 each by mouth 2 (two) times daily.   pantoprazole (PROTONIX) 20 MG tablet Take 20 mg by mouth daily.     Allergies:   Penicillins   Social History   Tobacco Use   Smoking status: Former Smoker   Smokeless tobacco: Never Used  Substance Use Topics   Alcohol use: Yes    Comment: occ   Drug use: No     Family Hx: The patient's family history includes Heart disease in her father and mother.  ROS:   Please see the history of present illness.     All other systems reviewed and are negative.   Prior CV studies:   The following studies were reviewed today:  Comprehensive pacemaker remote checks most recent from February 2020  Labs/Other Tests and Data Reviewed:    EKG:  Intracardiac electrogram shows atrial paced, ventricular sensed rhythm  Recent Labs: No results found for requested labs within last 8760 hours.   Recent Lipid Panel No results found for: CHOL, TRIG, HDL, CHOLHDL, LDLCALC, LDLDIRECT  Wt Readings from Last 3 Encounters:  06/21/18 92 lb 9.6 oz (42 kg)  05/25/17 104 lb (47.2 kg)  05/27/16 103 lb 12.8 oz (47.1 kg)     Objective:    Vital Signs:  BP (!) 98/56    Pulse 70    Resp 16    Wt 92 lb 9.6 oz (42 kg)    BMI 14.95 kg/m    VITAL SIGNS:  reviewed Poor video connection, limited exam.  She is cachectic.  ASSESSMENT & PLAN:    1. AFib:  Exceptional response to amiodarone with long-term maintenance of atrial paced rhythm and resolution of all the signs and symptoms of tachycardia related cardiomyopathy. Embolic risk is high (CHADSVasc 5: age 55, gender, HTN, CHF), but she has never had an embolic event.  She has had previous serious falls and injuries and is severely underweight and frail.  I think the risks of anticoagulation exceed the potential benefit.  2. CHF: She is very sedentary, but there is no evidence of hypervolemia and she appears to have good  quality of life.  3. Underweight:  She is now severely malnourished with a BMI of only 15.  She is receiving nutritional supplements.  She does not have any other signs of symptoms that would suggest thyrotoxicosis and in fact her recent TSH is mildly elevated, rather than decreased. 4. SSS:  Requires virtually 100% atrial pacing, very infrequent ventricular pacing. 5. s/p PM: Normal pacemaker function, no device reprogramming was necessary today. She is not pacemaker dependent. Remote downloads every 3 months and yearly office visits. 6. Orthostatic hypotension: She used to have high blood pressure and took antihypertensive medications, which we had to gradually wean and eventually stop.  She is now hypotensive at baseline.  However, she does not have a whole lot of symptoms from this and is very sedentary.  I do not think we should initiate vasoconstrictor or sodium loading therapy at this point. 7. Dementia: Little  change.  Progressive weight loss 1. Amiodarone:  Recent normal LFTs.  Will check a free T4 with her next TSH.  COVID-19 Education: The signs and symptoms of COVID-19 were discussed with the patient and how to seek care for testing (follow up with PCP or arrange E-visit).  The importance of social distancing was discussed today.  Time:   Today, I have spent 21 minutes with the patient with telehealth technology discussing the above problems.     Medication Adjustments/Labs and Tests Ordered: Current medicines are reviewed at length with the patient today.  Concerns regarding medicines are outlined above.   Tests Ordered: No orders of the defined types were placed in this encounter.   Medication Changes: No orders of the defined types were placed in this encounter.   Disposition:  Follow up 12 months  Signed, Sanda Klein, MD  06/21/2018 9:30 AM    [place]

## 2018-06-22 DIAGNOSIS — E038 Other specified hypothyroidism: Secondary | ICD-10-CM | POA: Diagnosis not present

## 2018-06-22 DIAGNOSIS — E039 Hypothyroidism, unspecified: Secondary | ICD-10-CM | POA: Diagnosis not present

## 2018-06-28 ENCOUNTER — Ambulatory Visit (INDEPENDENT_AMBULATORY_CARE_PROVIDER_SITE_OTHER): Payer: Medicare Other | Admitting: *Deleted

## 2018-06-28 ENCOUNTER — Other Ambulatory Visit: Payer: Self-pay

## 2018-06-28 DIAGNOSIS — I482 Chronic atrial fibrillation, unspecified: Secondary | ICD-10-CM | POA: Diagnosis not present

## 2018-06-28 DIAGNOSIS — I5022 Chronic systolic (congestive) heart failure: Secondary | ICD-10-CM

## 2018-06-29 ENCOUNTER — Telehealth: Payer: Self-pay

## 2018-06-29 NOTE — Telephone Encounter (Signed)
Left message for patient to remind of missed remote transmission.  

## 2018-06-30 DIAGNOSIS — K5900 Constipation, unspecified: Secondary | ICD-10-CM | POA: Diagnosis not present

## 2018-06-30 DIAGNOSIS — E039 Hypothyroidism, unspecified: Secondary | ICD-10-CM | POA: Diagnosis not present

## 2018-06-30 DIAGNOSIS — M6281 Muscle weakness (generalized): Secondary | ICD-10-CM | POA: Diagnosis not present

## 2018-06-30 DIAGNOSIS — F0390 Unspecified dementia without behavioral disturbance: Secondary | ICD-10-CM | POA: Diagnosis not present

## 2018-06-30 DIAGNOSIS — E876 Hypokalemia: Secondary | ICD-10-CM | POA: Diagnosis not present

## 2018-06-30 DIAGNOSIS — I4891 Unspecified atrial fibrillation: Secondary | ICD-10-CM | POA: Diagnosis not present

## 2018-06-30 DIAGNOSIS — D649 Anemia, unspecified: Secondary | ICD-10-CM | POA: Diagnosis not present

## 2018-06-30 DIAGNOSIS — R630 Anorexia: Secondary | ICD-10-CM | POA: Diagnosis not present

## 2018-06-30 DIAGNOSIS — R54 Age-related physical debility: Secondary | ICD-10-CM | POA: Diagnosis not present

## 2018-06-30 DIAGNOSIS — E559 Vitamin D deficiency, unspecified: Secondary | ICD-10-CM | POA: Diagnosis not present

## 2018-06-30 DIAGNOSIS — I495 Sick sinus syndrome: Secondary | ICD-10-CM | POA: Diagnosis not present

## 2018-07-01 LAB — CUP PACEART REMOTE DEVICE CHECK
Battery Remaining Longevity: 18 mo
Battery Remaining Percentage: 31 %
Brady Statistic RA Percent Paced: 97 %
Brady Statistic RV Percent Paced: 3 %
Date Time Interrogation Session: 20200515120500
Implantable Lead Implant Date: 20130729
Implantable Lead Implant Date: 20130729
Implantable Lead Location: 753859
Implantable Lead Location: 753860
Implantable Lead Model: 4456
Implantable Lead Model: 4479
Implantable Lead Serial Number: 466820
Implantable Lead Serial Number: 522401
Implantable Pulse Generator Implant Date: 20130729
Lead Channel Impedance Value: 329 Ohm
Lead Channel Impedance Value: 646 Ohm
Lead Channel Pacing Threshold Amplitude: 0.7 V
Lead Channel Pacing Threshold Amplitude: 1.1 V
Lead Channel Pacing Threshold Pulse Width: 0.4 ms
Lead Channel Pacing Threshold Pulse Width: 0.4 ms
Lead Channel Setting Pacing Amplitude: 1.6 V
Lead Channel Setting Pacing Amplitude: 2.6 V
Lead Channel Setting Pacing Pulse Width: 0.4 ms
Lead Channel Setting Sensing Sensitivity: 1 mV
Pulse Gen Serial Number: 131216

## 2018-07-05 LAB — CUP PACEART REMOTE DEVICE CHECK
Battery Remaining Longevity: 18 mo
Battery Remaining Percentage: 31 %
Brady Statistic RA Percent Paced: 97 %
Brady Statistic RV Percent Paced: 3 %
Date Time Interrogation Session: 20200520120902
Implantable Lead Implant Date: 20130729
Implantable Lead Implant Date: 20130729
Implantable Lead Location: 753859
Implantable Lead Location: 753860
Implantable Lead Model: 4456
Implantable Lead Model: 4479
Implantable Lead Serial Number: 466820
Implantable Lead Serial Number: 522401
Implantable Pulse Generator Implant Date: 20130729
Lead Channel Impedance Value: 329 Ohm
Lead Channel Impedance Value: 637 Ohm
Lead Channel Pacing Threshold Amplitude: 0.7 V
Lead Channel Pacing Threshold Amplitude: 1 V
Lead Channel Pacing Threshold Pulse Width: 0.4 ms
Lead Channel Pacing Threshold Pulse Width: 0.4 ms
Lead Channel Setting Pacing Amplitude: 1.5 V
Lead Channel Setting Pacing Amplitude: 2.6 V
Lead Channel Setting Pacing Pulse Width: 0.4 ms
Lead Channel Setting Sensing Sensitivity: 1 mV
Pulse Gen Serial Number: 131216

## 2018-07-18 NOTE — Progress Notes (Signed)
Remote pacemaker transmission.   

## 2018-08-14 DIAGNOSIS — E559 Vitamin D deficiency, unspecified: Secondary | ICD-10-CM | POA: Diagnosis not present

## 2018-08-15 DIAGNOSIS — I4891 Unspecified atrial fibrillation: Secondary | ICD-10-CM | POA: Diagnosis not present

## 2018-08-15 DIAGNOSIS — I495 Sick sinus syndrome: Secondary | ICD-10-CM | POA: Diagnosis not present

## 2018-08-15 DIAGNOSIS — E876 Hypokalemia: Secondary | ICD-10-CM | POA: Diagnosis not present

## 2018-08-15 DIAGNOSIS — K219 Gastro-esophageal reflux disease without esophagitis: Secondary | ICD-10-CM | POA: Diagnosis not present

## 2018-08-15 DIAGNOSIS — R54 Age-related physical debility: Secondary | ICD-10-CM | POA: Diagnosis not present

## 2018-08-15 DIAGNOSIS — F0390 Unspecified dementia without behavioral disturbance: Secondary | ICD-10-CM | POA: Diagnosis not present

## 2018-08-15 DIAGNOSIS — D649 Anemia, unspecified: Secondary | ICD-10-CM | POA: Diagnosis not present

## 2018-08-15 DIAGNOSIS — E039 Hypothyroidism, unspecified: Secondary | ICD-10-CM | POA: Diagnosis not present

## 2018-08-15 DIAGNOSIS — K5900 Constipation, unspecified: Secondary | ICD-10-CM | POA: Diagnosis not present

## 2018-08-15 DIAGNOSIS — E559 Vitamin D deficiency, unspecified: Secondary | ICD-10-CM | POA: Diagnosis not present

## 2018-08-15 DIAGNOSIS — M6281 Muscle weakness (generalized): Secondary | ICD-10-CM | POA: Diagnosis not present

## 2018-08-16 NOTE — Telephone Encounter (Signed)
Open n error °

## 2018-08-24 DIAGNOSIS — D508 Other iron deficiency anemias: Secondary | ICD-10-CM | POA: Diagnosis not present

## 2018-08-24 DIAGNOSIS — D649 Anemia, unspecified: Secondary | ICD-10-CM | POA: Diagnosis not present

## 2018-08-25 DIAGNOSIS — D649 Anemia, unspecified: Secondary | ICD-10-CM | POA: Diagnosis not present

## 2018-08-25 DIAGNOSIS — S45311A Laceration of superficial vein at shoulder and upper arm level, right arm, initial encounter: Secondary | ICD-10-CM | POA: Diagnosis not present

## 2018-08-25 DIAGNOSIS — K219 Gastro-esophageal reflux disease without esophagitis: Secondary | ICD-10-CM | POA: Diagnosis not present

## 2018-08-25 DIAGNOSIS — M6281 Muscle weakness (generalized): Secondary | ICD-10-CM | POA: Diagnosis not present

## 2018-08-25 DIAGNOSIS — E039 Hypothyroidism, unspecified: Secondary | ICD-10-CM | POA: Diagnosis not present

## 2018-08-25 DIAGNOSIS — Z7409 Other reduced mobility: Secondary | ICD-10-CM | POA: Diagnosis not present

## 2018-08-25 DIAGNOSIS — K5900 Constipation, unspecified: Secondary | ICD-10-CM | POA: Diagnosis not present

## 2018-08-25 DIAGNOSIS — I4891 Unspecified atrial fibrillation: Secondary | ICD-10-CM | POA: Diagnosis not present

## 2018-08-25 DIAGNOSIS — R296 Repeated falls: Secondary | ICD-10-CM | POA: Diagnosis not present

## 2018-08-25 DIAGNOSIS — R55 Syncope and collapse: Secondary | ICD-10-CM | POA: Diagnosis not present

## 2018-08-25 DIAGNOSIS — R54 Age-related physical debility: Secondary | ICD-10-CM | POA: Diagnosis not present

## 2018-08-25 DIAGNOSIS — E559 Vitamin D deficiency, unspecified: Secondary | ICD-10-CM | POA: Diagnosis not present

## 2018-08-30 ENCOUNTER — Telehealth: Payer: Self-pay | Admitting: Cardiovascular Disease

## 2018-08-30 NOTE — Telephone Encounter (Signed)
New Message      Patient is at [facility] and has virtual appt 7/16 please call [phone] today and ask for nursing supervisor to get consent and find out what we need to do for patient appt tomorrow.

## 2018-08-30 NOTE — Telephone Encounter (Signed)
LM for supervisor to return call.   Will need to know if patient's visit will be PHONE ONLY or VIDEO and best contact for appointment.   Please review consent with supervisor when call is returned

## 2018-08-31 ENCOUNTER — Telehealth (INDEPENDENT_AMBULATORY_CARE_PROVIDER_SITE_OTHER): Payer: Medicare Other | Admitting: Cardiovascular Disease

## 2018-08-31 VITALS — BP 130/70 | HR 74 | Resp 20 | Wt 81.8 lb

## 2018-08-31 DIAGNOSIS — Z950 Presence of cardiac pacemaker: Secondary | ICD-10-CM

## 2018-08-31 DIAGNOSIS — I495 Sick sinus syndrome: Secondary | ICD-10-CM

## 2018-08-31 DIAGNOSIS — W19XXXD Unspecified fall, subsequent encounter: Secondary | ICD-10-CM | POA: Diagnosis not present

## 2018-08-31 DIAGNOSIS — I951 Orthostatic hypotension: Secondary | ICD-10-CM

## 2018-08-31 DIAGNOSIS — I480 Paroxysmal atrial fibrillation: Secondary | ICD-10-CM

## 2018-08-31 DIAGNOSIS — E43 Unspecified severe protein-calorie malnutrition: Secondary | ICD-10-CM

## 2018-08-31 NOTE — Patient Instructions (Signed)
Medication Instructions:  Your physician recommends that you continue on your current medications as directed. Please refer to the Current Medication list given to you today.  If you need a refill on your cardiac medications before your next appointment, please call your pharmacy.   Lab work: Dr. Sallyanne Kuster would like for you to have the following labs drawn: CMET and TSH. Please fax results to [phone] Attention [name]  Testing/Procedures: None ordered  Follow-Up: At [facility], you and your health needs are our priority.  As part of our continuing mission to provide you with exceptional heart care, we have created designated Provider Care Teams.  These Care Teams include your primary Cardiologist (physician) and Advanced Practice Providers (APPs -  Physician Assistants and Nurse Practitioners) who all work together to provide you with the care you need, when you need it. You will need a follow up appointment in 12 months.  Please call our office 2 months in advance to schedule this appointment.  You may see Sanda Klein, MD or one of the following Advanced Practice Providers on your designated Care Team: Almyra Deforest, PA-C Fabian Sharp, [place]

## 2018-08-31 NOTE — Progress Notes (Signed)
Virtual Visit via Telephone Note   This visit type was conducted due to national recommendations for restrictions regarding the COVID-19 Pandemic (e.g. social distancing) in an effort to limit this patient's exposure and mitigate transmission in our community.  Due to her co-morbid illnesses, this patient is at least at moderate risk for complications without adequate follow up.  This format is felt to be most appropriate for this patient at this time.  The patient did not have access to video technology/had technical difficulties with video requiring transitioning to audio format only (telephone).  All issues noted in this document were discussed and addressed.  No physical exam could be performed with this format.  Please refer to the patient's chart for her  consent to telehealth for [facility]   Date:  08/31/2018   ID:  Jamie Daugherty, DOB [birth year], MRN [id number]  Patient Location: [place] Provider Location: Home  PCP:  Patient, No Pcp Per  Cardiologist:  Arantxa Piercey Electrophysiologist:  None   Evaluation Performed:  Follow-Up Visit  Chief Complaint:  Fall, syncope, pacemaker check  History of Present Illness:    Jamie Daugherty is a 85 y.o. female with longstanding sinus node dysfunction and paroxysmal atrial fibrillation, [employer] pacemaker implanted in 2013.  A few years ago she had problems with tachycardia-related cardiomyopathy and developed severe systolic heart failure, but had complete recovery of left ventricular systolic function and resolution of congestive heart failure after rhythm control with amiodarone.  She is severely underweight/malnourished (BMI under 15) and has had previous problems with orthostatic hypotension.  I spoke with Jamie Daugherty with the assistance of the nursing staff at [facility].  I also spoke with the patient's daughter-in-law Jamie Daugherty who previously would bring her to her office appointments.  She had a  recent fall which was apparently preceded by syncope.  She did have some lacerations to her right upper extremity, but thankfully no severe injuries.  Jamie Daugherty downplays the incident saying that it was not a big deal.  She does not recall losing consciousness.  She denies problems with shortness of breath or leg edema.  She has not had issues of palpitations and denies any chest discomfort.  Her daughter-in-law reports that Jamie Daugherty is cognitive function has deteriorated in the last 3 to 4 months where she has had limited contact with her family in the outside world due to the coronavirus pandemic.  This was also apparent to me during my brief interaction on the phone with the patient.  She does not recall coming to see me in the office for pacemaker checks.  Pacemaker function is normal.  Her device generator still has roughly 18 months of longevity.  The patient is not pacemaker dependent.  She has 97% atrial pacing and has only 3% ventricular pacing.  Her device has reported occasional very brief episodes of paroxysmal atrial tachycardia and paroxysmal atrial fibrillation.  Most of these are less than 30 seconds in duration.  Back in May she had one episode a lasted for 2 minutes and 30 seconds.  The overall burden of atrial fibrillation is under 1%.  When she does go into atrial fibrillation the ventricular rate is reasonably well controlled in the 83- 106 range.  Lead parameters remain normal.  Atrial sensing is not reported to since she is almost 100% atrial paced.  Impedance values are stable.  The patient does not have symptoms concerning for COVID-19 infection (fever, chills, cough, or new shortness of breath).  Past Medical History:  Diagnosis Date  . Anemia   . Arthritis   . Atrial fibrillation ([place])   . History of blood transfusion   . Hypertension   . Hypothyroidism   . Pacemaker - dual chamber [facility] implanted 2013 01/25/2013  . Syncope    Past Surgical History:   Procedure Laterality Date  . ABDOMINAL HYSTERECTOMY    . ANTERIOR APPROACH HEMI HIP ARTHROPLASTY Left 04/17/2015   Procedure: ANTERIOR APPROACH LEFT HEMI HIP ARTHROPLASTY;  Surgeon: Leandrew Koyanagi, MD;  Location: [place];  Service: Orthopedics;  Laterality: Left;  . ORIF FEMUR FRACTURE Left 08/07/2015   Procedure: OPEN REDUCTION INTERNAL FIXATION (ORIF) LEFT  PERIPROSTHETIC FEMUR FRACTURE;  Surgeon: Leandrew Koyanagi, MD;  Location: [place];  Service: Orthopedics;  Laterality: Left;  . PACEMAKER INSERTION       Current Meds  Medication Sig  . amiodarone (PACERONE) 200 MG tablet TAKE 1 TABLET BY MOUTH EVERY DAY  . ergocalciferol (VITAMIN D2) 1.25 MG (50000 UT) capsule Take 50,000 Units by mouth 2 (two) times a week.   . levothyroxine (SYNTHROID) 50 MCG tablet Take 1 tablet by mouth daily.  . Multiple Vitamin (MULTIVITAMIN) tablet Take 1 tablet by mouth daily.  . pantoprazole (PROTONIX) 20 MG tablet Take 20 mg by mouth daily.  . sennosides-docusate sodium (SENOKOT-S) 8.6-50 MG tablet Take 1 tablet by mouth 2 (two) times a day.  . [DISCONTINUED] NUTRITIONAL SUPPLEMENT LIQD Take 1 each by mouth 2 (two) times daily.     Allergies:   Penicillins   Social History   Tobacco Use  . Smoking status: Former Research scientist (life sciences)  . Smokeless tobacco: Never Used  Substance Use Topics  . Alcohol use: Yes    Comment: occ  . Drug use: No     Family Hx: The patient's family history includes Heart disease in her father and mother.  ROS:   Please see the history of present illness.    All other systems reviewed and are negative.   Prior CV studies:   The following studies were reviewed today:  Comprehensive pacemaker download.  Labs/Other Tests and Data Reviewed:    EKG:  Intracardiac electrogram shows atrial paced, ventricular sensed rhythm  Recent Labs: No results found for requested labs within last 8760 hours.   Recent Lipid Panel No results found for: CHOL, TRIG, HDL, CHOLHDL, LDLCALC, LDLDIRECT  Wt  Readings from Last 3 Encounters:  08/31/18 81 lb 12.8 oz (37.1 kg)  06/21/18 92 lb 9.6 oz (42 kg)  05/25/17 104 lb (47.2 kg)     Objective:    Vital Signs:  BP 130/70   Pulse 74   Resp 20   Wt 81 lb 12.8 oz (37.1 kg)   SpO2 96%   BMI 13.20 kg/m    VITAL SIGNS:  reviewed Unable to examine  ASSESSMENT & PLAN:    1. AFib: The episodes of atrial fibrillation are rare, brief and associated with good ventricular rate control.  I do not think they can be incriminated in her recent falls.  Anticoagulation was discontinued years ago due to her risk of injury and falls. 2. SSS: Considering how sedentary she has her heart rate histogram distribution is appropriate.  Activity level is on the average of 0.7 hours/day.  3. PM: Normal device function.  Anticipated generator longevity 18 months.  Continue remote downloads every 3 months. 4. Amiodarone: Has been remarkably effective at controlling the atrial fibrillation and is largely responsible for the resolution of  her heart failure.  However, she has not had routine monitoring with labs since April 2019.  We will order a TSH and liver function tests.  Thyroid and liver function tests should be checked every 6 months. 5. Fall: Judging from her previous experience with Mrs. Baranek, I would anticipate that orthostatic hypotension would be the most likely reason for her falls.  I see that labs were drawn on July 10, but I do not have access to those results (BMP, CBC).  She is very elderly, has extremely low body mass and is very prone to severe hypotension especially if she becomes dehydrated. 6. Dementia: There is clear evidence of cognitive decline, which has accelerated with the current problems with social isolation.  Need however to check and make sure that thyroid hormone levels are normal, since hypothyroidism can have peculiar manifestations in an elderly patient.  COVID-19 Education: The signs and symptoms of COVID-19 were discussed with the  patient and how to seek care for testing (follow up with PCP or arrange E-visit).  The importance of social distancing was discussed today.  Time:   Today, I have spent 22 minutes with the patient with telehealth technology discussing the above problems.     Medication Adjustments/Labs and Tests Ordered: Current medicines are reviewed at length with the patient today.  Concerns regarding medicines are outlined above.   Tests Ordered: No orders of the defined types were placed in this encounter.   Medication Changes: No orders of the defined types were placed in this encounter.  Patient Instructions  Medication Instructions:  Your physician recommends that you continue on your current medications as directed. Please refer to the Current Medication list given to you today.  If you need a refill on your cardiac medications before your next appointment, please call your pharmacy.   Lab work: Dr. Sallyanne Kuster would like for you to have the following labs drawn: CMET and TSH. Please fax results to [phone] Attention [name]  Testing/Procedures: None ordered  Follow-Up: At [facility], you and your health needs are our priority.  As part of our continuing mission to provide you with exceptional heart care, we have created designated Provider Care Teams.  These Care Teams include your primary Cardiologist (physician) and Advanced Practice Providers (APPs -  Physician Assistants and Nurse Practitioners) who all work together to provide you with the care you need, when you need it. You will need a follow up appointment in 12 months.  Please call our office 2 months in advance to schedule this appointment.  You may see Sanda Klein, MD or one of the following Advanced Practice Providers on your designated Care Team: Almyra Deforest, [place] Fabian Sharp, [place]          Follow Up:  Virtual Visit 12 months  Signed, Sanda Klein, MD  08/31/2018 2:30 PM    [place]

## 2018-09-01 DIAGNOSIS — K219 Gastro-esophageal reflux disease without esophagitis: Secondary | ICD-10-CM | POA: Diagnosis not present

## 2018-09-01 DIAGNOSIS — I4891 Unspecified atrial fibrillation: Secondary | ICD-10-CM | POA: Diagnosis not present

## 2018-09-01 DIAGNOSIS — E039 Hypothyroidism, unspecified: Secondary | ICD-10-CM | POA: Diagnosis not present

## 2018-09-01 DIAGNOSIS — K5900 Constipation, unspecified: Secondary | ICD-10-CM | POA: Diagnosis not present

## 2018-09-01 DIAGNOSIS — R7989 Other specified abnormal findings of blood chemistry: Secondary | ICD-10-CM | POA: Diagnosis not present

## 2018-09-01 DIAGNOSIS — D649 Anemia, unspecified: Secondary | ICD-10-CM | POA: Diagnosis not present

## 2018-09-01 DIAGNOSIS — E559 Vitamin D deficiency, unspecified: Secondary | ICD-10-CM | POA: Diagnosis not present

## 2018-09-01 NOTE — Telephone Encounter (Signed)
Patient appointment was yesterday

## 2018-09-12 DIAGNOSIS — F0390 Unspecified dementia without behavioral disturbance: Secondary | ICD-10-CM | POA: Diagnosis not present

## 2018-09-12 DIAGNOSIS — R1312 Dysphagia, oropharyngeal phase: Secondary | ICD-10-CM | POA: Diagnosis not present

## 2018-09-12 DIAGNOSIS — R55 Syncope and collapse: Secondary | ICD-10-CM | POA: Diagnosis not present

## 2018-09-12 DIAGNOSIS — E440 Moderate protein-calorie malnutrition: Secondary | ICD-10-CM | POA: Diagnosis not present

## 2018-09-13 DIAGNOSIS — Z7409 Other reduced mobility: Secondary | ICD-10-CM | POA: Diagnosis not present

## 2018-09-13 DIAGNOSIS — R296 Repeated falls: Secondary | ICD-10-CM | POA: Diagnosis not present

## 2018-09-14 DIAGNOSIS — Z79899 Other long term (current) drug therapy: Secondary | ICD-10-CM | POA: Diagnosis not present

## 2018-09-14 DIAGNOSIS — N390 Urinary tract infection, site not specified: Secondary | ICD-10-CM | POA: Diagnosis not present

## 2018-09-14 DIAGNOSIS — R319 Hematuria, unspecified: Secondary | ICD-10-CM | POA: Diagnosis not present

## 2018-09-18 DIAGNOSIS — E559 Vitamin D deficiency, unspecified: Secondary | ICD-10-CM | POA: Diagnosis not present

## 2018-09-18 DIAGNOSIS — R296 Repeated falls: Secondary | ICD-10-CM | POA: Diagnosis not present

## 2018-09-18 DIAGNOSIS — E039 Hypothyroidism, unspecified: Secondary | ICD-10-CM | POA: Diagnosis not present

## 2018-09-18 DIAGNOSIS — K5900 Constipation, unspecified: Secondary | ICD-10-CM | POA: Diagnosis not present

## 2018-09-18 DIAGNOSIS — K219 Gastro-esophageal reflux disease without esophagitis: Secondary | ICD-10-CM | POA: Diagnosis not present

## 2018-09-18 DIAGNOSIS — D649 Anemia, unspecified: Secondary | ICD-10-CM | POA: Diagnosis not present

## 2018-09-18 DIAGNOSIS — Z7409 Other reduced mobility: Secondary | ICD-10-CM | POA: Diagnosis not present

## 2018-09-18 DIAGNOSIS — I4891 Unspecified atrial fibrillation: Secondary | ICD-10-CM | POA: Diagnosis not present

## 2018-09-18 DIAGNOSIS — I2510 Atherosclerotic heart disease of native coronary artery without angina pectoris: Secondary | ICD-10-CM | POA: Diagnosis not present

## 2018-09-22 ENCOUNTER — Telehealth: Payer: Self-pay | Admitting: Licensed Clinical Social Worker

## 2018-09-22 NOTE — Telephone Encounter (Signed)
Palliative Care SW left a vm with patient's son, Alvester Chou, to schedule a visit.

## 2018-09-26 ENCOUNTER — Non-Acute Institutional Stay: Payer: Self-pay | Admitting: Internal Medicine

## 2018-09-26 ENCOUNTER — Non-Acute Institutional Stay: Payer: Self-pay | Admitting: Licensed Clinical Social Worker

## 2018-09-26 ENCOUNTER — Other Ambulatory Visit: Payer: Self-pay

## 2018-09-26 DIAGNOSIS — Z515 Encounter for palliative care: Secondary | ICD-10-CM

## 2018-09-26 NOTE — Progress Notes (Signed)
COMMUNITY PALLIATIVE CARE SW NOTE  PATIENT NAME: Jamie Daugherty DOB: 30-Mar-1918 MRN: [id number]  PRIMARY CARE PROVIDER: Patient, No Pcp Per  RESPONSIBLE PARTY:  Acct ID - Guarantor Home Phone Work Phone Relationship Acct Type  [id number] Ovidio Hanger* [phone]  Self P/F     [address], [place], [place] [address]   Due to the COVID-19 crisis, this virtual check-in visit was done via telephone from my office and it was initiated and consent given by this patient and or family.  PLAN OF CARE and INTERVENTIONS:             1. GOALS OF CARE/ ADVANCE CARE PLANNING:  Goal of son, Alvester Chou, is for patient to remain at [facility].  Patient has a DNR. 2. SOCIAL/EMOTIONAL/SPIRITUAL ASSESSMENT/ INTERVENTIONS:  SW conducted a virtual check-in visit with patient son.  He stated patient moved from [place] eight years ago.  She has been at [facility] for three years.  Son reports her dementia has become worst since the first of the year.  She had two falls last week.  She went from 100 to 81 pounds.  Alvester Chou and his wife, Suzi Roots, call or try to facetime with her regularly.  Patient recognizes them, but not her grandchildren.  SW provided education regarding Palliative Care and Hospice.  Alvester Chou has had several rounds of chemotherapy due to autoimmune disease.  He plays golf daily for self care. 3. PATIENT/CAREGIVER EDUCATION/ COPING:  Son expresses his feelings openly. 4. PERSONAL EMERGENCY PLAN:  Per facility protocol. 5. COMMUNITY RESOURCES COORDINATION/ HEALTH CARE NAVIGATION:  None. 6. FINANCIAL/LEGAL CONCERNS/INTERVENTIONS:  None.     SOCIAL HX:  Social History   Tobacco Use  . Smoking status: Former Research scientist (life sciences)  . Smokeless tobacco: Never Used  Substance Use Topics  . Alcohol use: Yes    Comment: occ    CODE STATUS:  DNR  ADVANCED DIRECTIVES: HCPOA, POA MOST FORM COMPLETE:  N HOSPICE EDUCATION PROVIDED: Y PPS:  Patient's appetite has decreased.  Patient is bed-bound. Duration of visit and  documentation:  45 minutes.      Creola Corn Irlene Crudup, LCSW

## 2018-09-27 NOTE — Progress Notes (Signed)
Designer, [employer]ellery Palliative Care Consult Note Telephone: [phone]  Fax: [phone]  PATIENT NAME: Jamie Daugherty DOB: 05-16-1918 MRN: [id number]  PRIMARY CARE PROVIDER:   Humberto Leep, NP/Dr. [place] PROVIDER:  Humberto Leep, NP  RESPONSIBLE PARTY:  Alvester Chou  [place](son/HCPOA)   RECOMMENDATIONS and PLAN:   Palliative Care Encounter  1.  Memory loss without behaviors: Related to vascular dementia.  FAST stage 7c. Advanced. Continue to provide total comfort care.   2.  Loss of appetite with weight loss:  Related to end stage dementia and nearing end of life.  Comfort feedings and hydration.   3.  Symptomatic syncope: Advanced CV disease and current rectal bleeding.  . Activities with assistance only. Fall prevention.  Attempt oral rehydration. Addendum:Comfort measures only requested by son.  Son/POA desires no escalation of healthcare  4.  Advanced care planning:  Introduction of palliative vs hospice care to son via phone.  Pt. Is unable to make her own decisions due to cognitive deficits.  Goals of care reviewed and son opts to continue DNAR/DNI, comfort measures only without antibiotics, IV fluids or tube feedings.  Dr. Eulas Post and PCP consulted with agreement of transition to Hospice care at [facility].   Addendum: Phone conversation with pt's son who is in agreement with this plan and is appreciative of services. No desire for return to hospital or escalation of care.  Support given  Due to the COVID-crisis, this visit was performed via audio/visual techniques from my office and was initiated and consented by the patient and/or family member.  I spent 40 minutes providing this consultation,  from 1430 to 1510. More than 50% of the time in this consultation was spent coordinating communication with patient and clinical staff.  Voicemail message left for son for additional communication.Marland Kitchen   HISTORY OF PRESENT ILLNESS:  Jamie Daugherty is a 85 y.o. year  old female with multiple medical problems including vascular dementia requiring assistance with all ADLs, increasing episodes of syncope with LOC, afib with internal pacemaker. Nurse reports recurrent episode today of bright red blood noted with bowel movement.  Past hx of GI bleeding requiring transfusion.  Recent Hgb in July is 7.8.  She also is eating minimally inclusive of sips of soup, OJ and water only with medication intake. Weight has decreased from 92# in 5/20 to 83# in 8/20. She is non-ambulatory, total care, incontinent of B&B and loses consciousness with transfers to wheelchair or commode. Recurrent falls with attempts for self transfers.  Palliative Care was asked to help address goals of care.   CODE STATUS: DNAR/DNI  PPS: 20% eating bites and sips HOSPICE ELIGIBILITY/DIAGNOSIS: YES/ Cardiovascular disease, rectal bleeding, end stage vascular dementia  PAST MEDICAL HISTORY:  Past Medical History:  Diagnosis Date  . Anemia   . Arthritis   . Atrial fibrillation ([place])   . History of blood transfusion   . Hypertension   . Hypothyroidism   . Pacemaker - dual chamber [facility] implanted 2013 01/25/2013  . Syncope       PERTINENT MEDICATIONS:  Outpatient Encounter Medications as of 83/12/2018  Medication Sig  . amiodarone (PACERONE) 200 MG tablet TAKE 1 TABLET BY MOUTH EVERY DAY  . ergocalciferol (VITAMIN D2) 1.25 MG (50000 UT) capsule Take 50,000 Units by mouth 2 (two) times a week.   . levothyroxine (SYNTHROID) 50 MCG tablet Take 1 tablet by mouth daily.  . Multiple Vitamin (MULTIVITAMIN) tablet Take 1 tablet by mouth daily.  Marland Kitchen  pantoprazole (PROTONIX) 20 MG tablet Take 20 mg by mouth daily.  . sennosides-docusate sodium (SENOKOT-S) 8.6-50 MG tablet Take 1 tablet by mouth 2 (two) times a day.   No facility-administered encounter medications on file as of 09/26/2018.     PHYSICAL EXAM:   General: NAD, frail appearing, thin Cardiovascular: unable to assess  Pulmonary: no increased respiratory effort Abdomen: Unable to assess Extremities: no edema Skin: pale in color Neurological: Alert and oriented to self, confusion with minimal conversation. Cognitive deficits noted Psych:  Pleasant mood  Gonzella Lex, NP-C

## 2018-09-28 ENCOUNTER — Ambulatory Visit (INDEPENDENT_AMBULATORY_CARE_PROVIDER_SITE_OTHER): Payer: Medicare Other | Admitting: *Deleted

## 2018-09-28 DIAGNOSIS — I495 Sick sinus syndrome: Secondary | ICD-10-CM

## 2018-09-29 DIAGNOSIS — K922 Gastrointestinal hemorrhage, unspecified: Secondary | ICD-10-CM | POA: Diagnosis not present

## 2018-09-29 DIAGNOSIS — R627 Adult failure to thrive: Secondary | ICD-10-CM | POA: Diagnosis not present

## 2018-09-29 DIAGNOSIS — F0150 Vascular dementia without behavioral disturbance: Secondary | ICD-10-CM | POA: Diagnosis not present

## 2018-09-29 LAB — CUP PACEART REMOTE DEVICE CHECK
Battery Remaining Longevity: 18 mo
Battery Remaining Percentage: 27 %
Brady Statistic RA Percent Paced: 97 %
Brady Statistic RV Percent Paced: 3 %
Date Time Interrogation Session: 20200813163700
Implantable Lead Implant Date: 20130729
Implantable Lead Implant Date: 20130729
Implantable Lead Location: 753859
Implantable Lead Location: 753860
Implantable Lead Model: 4456
Implantable Lead Model: 4479
Implantable Lead Serial Number: 466820
Implantable Lead Serial Number: 522401
Implantable Pulse Generator Implant Date: 20130729
Lead Channel Impedance Value: 316 Ohm
Lead Channel Impedance Value: 663 Ohm
Lead Channel Pacing Threshold Amplitude: 0.7 V
Lead Channel Pacing Threshold Amplitude: 1.2 V
Lead Channel Pacing Threshold Pulse Width: 0.4 ms
Lead Channel Pacing Threshold Pulse Width: 0.4 ms
Lead Channel Setting Pacing Amplitude: 1.7 V
Lead Channel Setting Pacing Amplitude: 2.6 V
Lead Channel Setting Pacing Pulse Width: 0.4 ms
Lead Channel Setting Sensing Sensitivity: 1 mV
Pulse Gen Serial Number: 131216

## 2018-10-05 DIAGNOSIS — I4891 Unspecified atrial fibrillation: Secondary | ICD-10-CM | POA: Diagnosis not present

## 2018-10-05 DIAGNOSIS — E43 Unspecified severe protein-calorie malnutrition: Secondary | ICD-10-CM | POA: Diagnosis not present

## 2018-10-05 DIAGNOSIS — I5022 Chronic systolic (congestive) heart failure: Secondary | ICD-10-CM | POA: Diagnosis not present

## 2018-10-05 DIAGNOSIS — M1990 Unspecified osteoarthritis, unspecified site: Secondary | ICD-10-CM | POA: Diagnosis not present

## 2018-10-05 DIAGNOSIS — F0390 Unspecified dementia without behavioral disturbance: Secondary | ICD-10-CM | POA: Diagnosis not present

## 2018-10-05 DIAGNOSIS — D631 Anemia in chronic kidney disease: Secondary | ICD-10-CM | POA: Diagnosis not present

## 2018-10-05 DIAGNOSIS — K219 Gastro-esophageal reflux disease without esophagitis: Secondary | ICD-10-CM | POA: Diagnosis not present

## 2018-10-05 DIAGNOSIS — R634 Abnormal weight loss: Secondary | ICD-10-CM | POA: Diagnosis not present

## 2018-10-05 DIAGNOSIS — I130 Hypertensive heart and chronic kidney disease with heart failure and stage 1 through stage 4 chronic kidney disease, or unspecified chronic kidney disease: Secondary | ICD-10-CM | POA: Diagnosis not present

## 2018-10-05 DIAGNOSIS — Z681 Body mass index (BMI) 19 or less, adult: Secondary | ICD-10-CM | POA: Diagnosis not present

## 2018-10-05 DIAGNOSIS — Z8679 Personal history of other diseases of the circulatory system: Secondary | ICD-10-CM | POA: Diagnosis not present

## 2018-10-05 DIAGNOSIS — F419 Anxiety disorder, unspecified: Secondary | ICD-10-CM | POA: Diagnosis not present

## 2018-10-05 DIAGNOSIS — R159 Full incontinence of feces: Secondary | ICD-10-CM | POA: Diagnosis not present

## 2018-10-05 DIAGNOSIS — Z741 Need for assistance with personal care: Secondary | ICD-10-CM | POA: Diagnosis not present

## 2018-10-05 DIAGNOSIS — R296 Repeated falls: Secondary | ICD-10-CM | POA: Diagnosis not present

## 2018-10-05 DIAGNOSIS — R32 Unspecified urinary incontinence: Secondary | ICD-10-CM | POA: Diagnosis not present

## 2018-10-05 DIAGNOSIS — N183 Chronic kidney disease, stage 3 (moderate): Secondary | ICD-10-CM | POA: Diagnosis not present

## 2018-10-05 DIAGNOSIS — I429 Cardiomyopathy, unspecified: Secondary | ICD-10-CM | POA: Diagnosis not present

## 2018-10-05 DIAGNOSIS — E039 Hypothyroidism, unspecified: Secondary | ICD-10-CM | POA: Diagnosis not present

## 2018-10-05 DIAGNOSIS — Z950 Presence of cardiac pacemaker: Secondary | ICD-10-CM | POA: Diagnosis not present

## 2018-10-06 DIAGNOSIS — K5900 Constipation, unspecified: Secondary | ICD-10-CM | POA: Diagnosis not present

## 2018-10-06 DIAGNOSIS — R54 Age-related physical debility: Secondary | ICD-10-CM | POA: Diagnosis not present

## 2018-10-06 DIAGNOSIS — K922 Gastrointestinal hemorrhage, unspecified: Secondary | ICD-10-CM | POA: Diagnosis not present

## 2018-10-06 DIAGNOSIS — E039 Hypothyroidism, unspecified: Secondary | ICD-10-CM | POA: Diagnosis not present

## 2018-10-06 DIAGNOSIS — E43 Unspecified severe protein-calorie malnutrition: Secondary | ICD-10-CM | POA: Diagnosis not present

## 2018-10-06 DIAGNOSIS — I480 Paroxysmal atrial fibrillation: Secondary | ICD-10-CM | POA: Diagnosis not present

## 2018-10-06 DIAGNOSIS — F0390 Unspecified dementia without behavioral disturbance: Secondary | ICD-10-CM | POA: Diagnosis not present

## 2018-10-06 DIAGNOSIS — I5022 Chronic systolic (congestive) heart failure: Secondary | ICD-10-CM | POA: Diagnosis not present

## 2018-10-06 DIAGNOSIS — D649 Anemia, unspecified: Secondary | ICD-10-CM | POA: Diagnosis not present

## 2018-10-06 DIAGNOSIS — R627 Adult failure to thrive: Secondary | ICD-10-CM | POA: Diagnosis not present

## 2018-10-06 DIAGNOSIS — E559 Vitamin D deficiency, unspecified: Secondary | ICD-10-CM | POA: Diagnosis not present

## 2018-10-09 ENCOUNTER — Encounter: Payer: Self-pay | Admitting: Cardiology

## 2018-10-09 DIAGNOSIS — I495 Sick sinus syndrome: Secondary | ICD-10-CM

## 2018-10-09 NOTE — Progress Notes (Signed)
Remote pacemaker transmission.   

## 2018-10-12 DIAGNOSIS — I130 Hypertensive heart and chronic kidney disease with heart failure and stage 1 through stage 4 chronic kidney disease, or unspecified chronic kidney disease: Secondary | ICD-10-CM | POA: Diagnosis not present

## 2018-10-12 DIAGNOSIS — R634 Abnormal weight loss: Secondary | ICD-10-CM | POA: Diagnosis not present

## 2018-10-12 DIAGNOSIS — I5022 Chronic systolic (congestive) heart failure: Secondary | ICD-10-CM | POA: Diagnosis not present

## 2018-10-12 DIAGNOSIS — I4891 Unspecified atrial fibrillation: Secondary | ICD-10-CM | POA: Diagnosis not present

## 2018-10-12 DIAGNOSIS — E43 Unspecified severe protein-calorie malnutrition: Secondary | ICD-10-CM | POA: Diagnosis not present

## 2018-10-12 DIAGNOSIS — R296 Repeated falls: Secondary | ICD-10-CM | POA: Diagnosis not present

## 2018-10-16 ENCOUNTER — Emergency Department (HOSPITAL_COMMUNITY)

## 2018-10-16 ENCOUNTER — Emergency Department (HOSPITAL_COMMUNITY)
Admission: EM | Admit: 2018-10-16 | Discharge: 2018-10-16 | Disposition: A | Discharge status: Home / Self Care | Attending: Emergency Medicine | Admitting: Emergency Medicine

## 2018-10-16 ENCOUNTER — Other Ambulatory Visit: Payer: Self-pay

## 2018-10-16 ENCOUNTER — Encounter (HOSPITAL_COMMUNITY): Payer: Self-pay

## 2018-10-16 DIAGNOSIS — S81811A Laceration without foreign body, right lower leg, initial encounter: Secondary | ICD-10-CM | POA: Diagnosis not present

## 2018-10-16 DIAGNOSIS — I130 Hypertensive heart and chronic kidney disease with heart failure and stage 1 through stage 4 chronic kidney disease, or unspecified chronic kidney disease: Secondary | ICD-10-CM | POA: Diagnosis not present

## 2018-10-16 DIAGNOSIS — W1830XA Fall on same level, unspecified, initial encounter: Secondary | ICD-10-CM | POA: Insufficient documentation

## 2018-10-16 DIAGNOSIS — Z87891 Personal history of nicotine dependence: Secondary | ICD-10-CM | POA: Diagnosis not present

## 2018-10-16 DIAGNOSIS — I10 Essential (primary) hypertension: Secondary | ICD-10-CM | POA: Diagnosis not present

## 2018-10-16 DIAGNOSIS — W19XXXA Unspecified fall, initial encounter: Secondary | ICD-10-CM | POA: Diagnosis not present

## 2018-10-16 DIAGNOSIS — S098XXA Other specified injuries of head, initial encounter: Secondary | ICD-10-CM | POA: Diagnosis present

## 2018-10-16 DIAGNOSIS — Z950 Presence of cardiac pacemaker: Secondary | ICD-10-CM | POA: Diagnosis not present

## 2018-10-16 DIAGNOSIS — E43 Unspecified severe protein-calorie malnutrition: Secondary | ICD-10-CM | POA: Diagnosis not present

## 2018-10-16 DIAGNOSIS — F0390 Unspecified dementia without behavioral disturbance: Secondary | ICD-10-CM | POA: Diagnosis not present

## 2018-10-16 DIAGNOSIS — S0990XA Unspecified injury of head, initial encounter: Secondary | ICD-10-CM | POA: Diagnosis not present

## 2018-10-16 DIAGNOSIS — Y939 Activity, unspecified: Secondary | ICD-10-CM | POA: Diagnosis not present

## 2018-10-16 DIAGNOSIS — R634 Abnormal weight loss: Secondary | ICD-10-CM | POA: Diagnosis not present

## 2018-10-16 DIAGNOSIS — E039 Hypothyroidism, unspecified: Secondary | ICD-10-CM | POA: Insufficient documentation

## 2018-10-16 DIAGNOSIS — R58 Hemorrhage, not elsewhere classified: Secondary | ICD-10-CM | POA: Diagnosis not present

## 2018-10-16 DIAGNOSIS — I4891 Unspecified atrial fibrillation: Secondary | ICD-10-CM | POA: Diagnosis not present

## 2018-10-16 DIAGNOSIS — Z79899 Other long term (current) drug therapy: Secondary | ICD-10-CM | POA: Diagnosis not present

## 2018-10-16 DIAGNOSIS — S199XXA Unspecified injury of neck, initial encounter: Secondary | ICD-10-CM | POA: Diagnosis not present

## 2018-10-16 DIAGNOSIS — I5022 Chronic systolic (congestive) heart failure: Secondary | ICD-10-CM | POA: Diagnosis not present

## 2018-10-16 DIAGNOSIS — R279 Unspecified lack of coordination: Secondary | ICD-10-CM | POA: Diagnosis not present

## 2018-10-16 DIAGNOSIS — R531 Weakness: Secondary | ICD-10-CM | POA: Diagnosis not present

## 2018-10-16 DIAGNOSIS — I110 Hypertensive heart disease with heart failure: Secondary | ICD-10-CM | POA: Insufficient documentation

## 2018-10-16 DIAGNOSIS — Y999 Unspecified external cause status: Secondary | ICD-10-CM | POA: Insufficient documentation

## 2018-10-16 DIAGNOSIS — R410 Disorientation, unspecified: Secondary | ICD-10-CM | POA: Diagnosis not present

## 2018-10-16 DIAGNOSIS — Y92129 Unspecified place in nursing home as the place of occurrence of the external cause: Secondary | ICD-10-CM | POA: Diagnosis not present

## 2018-10-16 DIAGNOSIS — S0181XA Laceration without foreign body of other part of head, initial encounter: Secondary | ICD-10-CM | POA: Diagnosis not present

## 2018-10-16 DIAGNOSIS — R4182 Altered mental status, unspecified: Secondary | ICD-10-CM | POA: Diagnosis not present

## 2018-10-16 DIAGNOSIS — Z743 Need for continuous supervision: Secondary | ICD-10-CM | POA: Diagnosis not present

## 2018-10-16 DIAGNOSIS — R296 Repeated falls: Secondary | ICD-10-CM | POA: Diagnosis not present

## 2018-10-16 DIAGNOSIS — R404 Transient alteration of awareness: Secondary | ICD-10-CM | POA: Diagnosis not present

## 2018-10-16 HISTORY — DX: History of falling: Z91.81

## 2018-10-16 HISTORY — DX: Unspecified dementia, unspecified severity, without behavioral disturbance, psychotic disturbance, mood disturbance, and anxiety: F03.90

## 2018-10-16 MED ORDER — LIDOCAINE-EPINEPHRINE (PF) 2 %-1:200000 IJ SOLN
10.0000 mL | Freq: Once | INTRAMUSCULAR | Status: AC
  Administered 2018-10-16: 10 mL via INTRADERMAL
  Filled 2018-10-16: qty 10

## 2018-10-16 MED ORDER — BACITRACIN ZINC 500 UNIT/GM EX OINT
TOPICAL_OINTMENT | Freq: Two times a day (BID) | CUTANEOUS | Status: DC
  Administered 2018-10-16: 1 via TOPICAL
  Filled 2018-10-16: qty 0.9

## 2018-10-16 MED ORDER — LIDOCAINE-EPINEPHRINE-TETRACAINE (LET) SOLUTION
3.0000 mL | Freq: Once | NASAL | Status: AC
  Administered 2018-10-16: 3 mL via TOPICAL
  Filled 2018-10-16: qty 3

## 2018-10-16 NOTE — ED Notes (Signed)
Notified [facility] for transportation 

## 2018-10-16 NOTE — ED Notes (Signed)
[phone] Ria Comment

## 2018-10-16 NOTE — ED Triage Notes (Signed)
Pt arrives [facility] from [facility] for an unwitnessed fall. Pt has abrasions to the right knee, and forehead. Pt is currently in a [facility]-[place].

## 2018-10-16 NOTE — ED Provider Notes (Signed)
[facility] DEPT Provider Note   CSN: [id number] Arrival date & time: 10/16/18  [id number]     History   Chief Complaint Chief Complaint  Patient presents with  . Fall    HPI Jamie Daugherty is a 85 y.o. female.     85 year old female with history of A. fib, not anticoagulated, history of advanced dementia in hospice care, brought in by EMS from nursing facility for injuries from a fall.  Patient was found down by nursing home staff this morning around 730, laceration to the forehead and skin tear to the right knee.  Patient's granddaughter is at bedside, patient's son is on the phone at time of evaluation.  Patient seems to be at baseline per family.     Past Medical History:  Diagnosis Date  . Anemia   . Arthritis   . Atrial fibrillation ([place])   . Dementia ([place])   . History of blood transfusion   . History of falling   . Hypertension   . Hypothyroidism   . Pacemaker - dual chamber [facility] implanted 2013 01/25/2013  . Syncope     Patient Active Problem List   Diagnosis Date Noted  . Lower GI bleed 05/26/2016  . Chronic atrial fibrillation   . Orthostatic hypotension   . SSS (sick sinus syndrome) ([place]) 09/06/2015  . Hip fracture requiring operative repair ([place]) 08/07/2015  . Hip joint replacement status   . Dementia ([place])   . Hypoxia   . Orthostasis   . Hyponatremia   . Status post left hip replacement   . Hip fracture ([place]) 04/16/2015  . Hypertension 04/16/2015  . Fall 04/16/2015  . Chronic systolic heart failure ([place]) 12/25/2014  . Anemia 10/20/2014  . Anemia due to blood loss, acute 10/19/2014  . Protein-calorie malnutrition, severe ([place]) 10/16/2014  . Posterior epistaxis 10/15/2014  . Syncope 10/15/2014  . Dementia without behavioral disturbance ([place]) 10/15/2014  . Laceration of right upper arm 10/15/2014  . Epistaxis   . Persistent atrial fibrillation 09/17/2014  . Pacemaker - dual chamber [facility]  implanted 2013 01/25/2013  . PAF (paroxysmal atrial fibrillation) ([place]) 11/06/2012    Past Surgical History:  Procedure Laterality Date  . ABDOMINAL HYSTERECTOMY    . ANTERIOR APPROACH HEMI HIP ARTHROPLASTY Left 04/17/2015   Procedure: ANTERIOR APPROACH LEFT HEMI HIP ARTHROPLASTY;  Surgeon: Leandrew Koyanagi, MD;  Location: [place];  Service: Orthopedics;  Laterality: Left;  . ORIF FEMUR FRACTURE Left 08/07/2015   Procedure: OPEN REDUCTION INTERNAL FIXATION (ORIF) LEFT  PERIPROSTHETIC FEMUR FRACTURE;  Surgeon: Leandrew Koyanagi, MD;  Location: [place];  Service: Orthopedics;  Laterality: Left;  . PACEMAKER INSERTION       OB History   No obstetric history on file.      Home Medications    Prior to Admission medications   Medication Sig Start Date End Date Taking? Authorizing Provider  amiodarone (PACERONE) 200 MG tablet TAKE 1 TABLET BY MOUTH EVERY DAY 10/22/15   Croitoru, Mihai, MD  ergocalciferol (VITAMIN D2) 1.25 MG (50000 UT) capsule Take 50,000 Units by mouth 2 (two) times a week.     [provider]  levothyroxine (SYNTHROID) 50 MCG tablet Take 1 tablet by mouth daily. 03/20/18   [provider]  Multiple Vitamin (MULTIVITAMIN) tablet Take 1 tablet by mouth daily.    [provider]  pantoprazole (PROTONIX) 20 MG tablet Take 20 mg by mouth daily.    [provider]  sennosides-docusate  sodium (SENOKOT-S) 8.6-50 MG tablet Take 1 tablet by mouth 2 (two) times a day.    [provider]    Family History Family History  Problem Relation Age of Onset  . Heart disease Mother   . Heart disease Father     Social History Social History   Tobacco Use  . Smoking status: Former Research scientist (life sciences)  . Smokeless tobacco: Never Used  Substance Use Topics  . Alcohol use: Yes    Comment: occ  . Drug use: No     Allergies   Penicillins   Review of Systems Review of Systems  Unable to perform ROS: Dementia  Skin: Positive for wound.     Physical Exam  Updated Vital Signs BP (!) 156/71 (BP Location: Left Arm)   Pulse 70   Temp (!) 97.5 F (36.4 C) (Oral)   Resp 16   SpO2 100%   Physical Exam Vitals signs and nursing note reviewed.  Constitutional:        Comments: Awake, thin  HENT:     Mouth/Throat:     Mouth: Mucous membranes are dry.  Eyes:     Pupils: Pupils are equal, round, and reactive to light.  Neck:     Musculoskeletal: No muscular tenderness.  Cardiovascular:     Rate and Rhythm: Normal rate and regular rhythm.     Pulses: Normal pulses.     Heart sounds: Normal heart sounds.  Pulmonary:     Effort: Pulmonary effort is normal.     Breath sounds: Normal breath sounds.  Chest:     Chest wall: No tenderness.  Abdominal:     Palpations: Abdomen is soft.     Tenderness: There is no abdominal tenderness.  Musculoskeletal:        General: No swelling or deformity.     Comments: No pain with palpation or range of motion of the extremities. Minor skin tear to anterior right lower leg just inferior to the knee.  Skin:    General: Skin is warm and dry.     Findings: No erythema.  Neurological:     Mental Status: Mental status is at baseline.     GCS: GCS eye subscore is 3. GCS verbal subscore is 4. GCS motor subscore is 5.      ED Treatments / Results  Labs (all labs ordered are listed, but only abnormal results are displayed) Labs Reviewed - No data to display  EKG None  Radiology Ct Head Wo Contrast  Result Date: 10/16/2018 CLINICAL DATA:  Patient status post fall.  Initial encounter. EXAM: CT HEAD WITHOUT CONTRAST TECHNIQUE: Contiguous axial images were obtained from the base of the skull through the vertex without intravenous contrast. COMPARISON:  None. FINDINGS: Brain: No evidence of acute infarction, hemorrhage, hydrocephalus, extra-axial collection or mass lesion/mass effect. Atrophy and extensive chronic microvascular ischemic change are noted. Vascular: No hyperdense vessel or unexpected  calcification. Skull: Intact.  No focal lesion. Sinuses/Orbits: Status post cataract surgery.  Otherwise negative. Other: None. IMPRESSION: No acute abnormality. Atrophy and extensive chronic microvascular ischemic change. Electronically Signed   By: Inge Rise M.D.   On: 10/16/2018 09:50   Ct Cervical Spine Wo Contrast  Result Date: 10/16/2018 CLINICAL DATA:  Status post fall. EXAM: CT CERVICAL SPINE WITHOUT CONTRAST TECHNIQUE: Multidetector CT imaging of the cervical spine was performed without intravenous contrast. Multiplanar CT image reconstructions were also generated. COMPARISON:  None. FINDINGS: Alignment: Maintained. Skull base and vertebrae: No acute fracture. No primary bone  lesion or focal pathologic process. Soft tissues and spinal canal: No prevertebral fluid or swelling. No visible canal hematoma. Disc levels: Loss of disc space height is most notable at C5-6 and C6-7. Scattered facet degenerative change noted. Upper chest: Biapical scar noted. Other: None. IMPRESSION: No acute abnormality. Mild cervical spondylosis. Electronically Signed   By: Inge Rise M.D.   On: 10/16/2018 09:52    Procedures .Marland KitchenLaceration Repair  Date/Time: 10/16/2018 12:30 PM Performed by: Tacy Learn, PA-C Authorized by: Tacy Learn, PA-C   Consent:    Consent obtained:  Verbal   Consent given by:  Patient and healthcare agent   Risks discussed:  Infection, need for additional repair, pain, poor cosmetic result and poor wound healing   Alternatives discussed:  No treatment and delayed treatment Universal protocol:    Procedure explained and questions answered to patient or proxy's satisfaction: yes     Relevant documents present and verified: yes     Test results available and properly labeled: yes     Imaging studies available: yes     Required blood products, implants, devices, and special equipment available: yes     Site/side marked: yes     Immediately prior to procedure, a time  out was called: yes     Patient identity confirmed:  Arm band Anesthesia (see MAR for exact dosages):    Anesthesia method:  Local infiltration and topical application   Topical anesthetic:  LET   Local anesthetic:  Lidocaine 2% WITH epi Laceration details:    Location:  Face   Face location:  Forehead   Length (cm):  3   Depth (mm):  5 Repair type:    Repair type:  Simple Pre-procedure details:    Preparation:  Patient was prepped and draped in usual sterile fashion and imaging obtained to evaluate for foreign bodies Exploration:    Hemostasis achieved with:  LET and epinephrine   Wound exploration: wound explored through full range of motion and entire depth of wound probed and visualized     Wound extent: no foreign bodies/material noted, no muscle damage noted and no underlying fracture noted     Contaminated: no   Treatment:    Area cleansed with:  Saline   Amount of cleaning:  Standard   Irrigation solution:  Sterile saline Skin repair:    Repair method:  Sutures   Suture size:  4-0   Suture material:  Prolene   Suture technique:  Simple interrupted   Number of sutures:  5 Approximation:    Approximation:  Close Post-procedure details:    Dressing:  Antibiotic ointment and adhesive bandage   Patient tolerance of procedure:  Tolerated well, no immediate complications   (including critical care time)  Medications Ordered in ED Medications  bacitracin ointment (1 application Topical Given 10/16/18 1146)  lidocaine-EPINEPHrine (XYLOCAINE W/EPI) 2 %-1:200000 (PF) injection 10 mL (10 mLs Intradermal Given by Other 10/16/18 1146)  lidocaine-EPINEPHrine-tetracaine (LET) solution (3 mLs Topical Given 10/16/18 1003)     Initial Impression / Assessment and Plan / ED Course  I have reviewed the triage vital signs and the nursing notes.  Pertinent labs & imaging results that were available during my care of the patient were reviewed by me and considered in my medical decision  making (see chart for details).  Clinical Course as of Oct 16 1231  Mon Oct 16, 2018  0922 85yo female with advanced dementia, involved in hospice care, brought in by EMS from nursing  facility for a fall. Minor skin tear to the knee which will be dressed. Laceration to mid forehead, planned closure with sutures. Discussed plan of care with granddaughter at bedside, patient's son, Jamie Daugherty, on the phone, plan is for CT head and c-spine, wound closure and discharge back to nursing facility. Family declines further workup today regarding cause of fall (declines labs including blood and urine testing).    [LM]  1232 CT head and c-spine with out significant injury from fall today. Forehead wound closed with sutures, recommend removal in 5-7 days.   [LM]    Clinical Course User Index [LM] Tacy Learn, PA-C      Final Clinical Impressions(s) / ED Diagnoses   Final diagnoses:  Fall, initial encounter  Laceration of forehead, initial encounter  Skin tear of right lower leg without complication, initial encounter    ED Discharge Orders    None       Tacy Learn, PA-C 10/16/18 1233    Varney Biles, MD 10/17/18 1340

## 2018-10-16 NOTE — ED Notes (Signed)
Using clean technique, applied BACITRACIN to the sutured wound. Covered with a telfa pad and secured with surgical tape. Patient tolerated it well.

## 2018-10-16 NOTE — Discharge Instructions (Addendum)
Suture removal in 5-7 days

## 2018-10-16 NOTE — Progress Notes (Addendum)
Manufacturing engineer [facility]) Hospital Liaison RN note.  This is an active patient with [facility] hospice services at [facility].  Spoke with her son and he states she is a DNR and wanted to make sure the hospital staff is aware of this.    [place] will follow while in ED to assist in [place] and discharge planning.  Please call if you have any hospice related questions or concerns.   If patient is discharged back to [facility] and needs ambulance transport, please use [facility] for ambulance transport as [facility] is contracted with [facility] for all active hospice patients.  Thank you, Farrel Gordon, [facility] ( listed on AMION under Hospice and [place])  [phone]

## 2018-10-16 NOTE — ED Notes (Signed)
[facility] has already transported patient back to [facility]. This Probation officer was unware that this patient was an active hospice patient.

## 2018-10-17 DIAGNOSIS — Z950 Presence of cardiac pacemaker: Secondary | ICD-10-CM | POA: Diagnosis not present

## 2018-10-17 DIAGNOSIS — D649 Anemia, unspecified: Secondary | ICD-10-CM | POA: Diagnosis not present

## 2018-10-17 DIAGNOSIS — Z681 Body mass index (BMI) 19 or less, adult: Secondary | ICD-10-CM | POA: Diagnosis not present

## 2018-10-17 DIAGNOSIS — Z8719 Personal history of other diseases of the digestive system: Secondary | ICD-10-CM | POA: Diagnosis not present

## 2018-10-17 DIAGNOSIS — I5022 Chronic systolic (congestive) heart failure: Secondary | ICD-10-CM | POA: Diagnosis not present

## 2018-10-17 DIAGNOSIS — I429 Cardiomyopathy, unspecified: Secondary | ICD-10-CM | POA: Diagnosis not present

## 2018-10-17 DIAGNOSIS — M1990 Unspecified osteoarthritis, unspecified site: Secondary | ICD-10-CM | POA: Diagnosis not present

## 2018-10-17 DIAGNOSIS — N183 Chronic kidney disease, stage 3 (moderate): Secondary | ICD-10-CM | POA: Diagnosis not present

## 2018-10-17 DIAGNOSIS — F0390 Unspecified dementia without behavioral disturbance: Secondary | ICD-10-CM | POA: Diagnosis not present

## 2018-10-17 DIAGNOSIS — R634 Abnormal weight loss: Secondary | ICD-10-CM | POA: Diagnosis not present

## 2018-10-17 DIAGNOSIS — I130 Hypertensive heart and chronic kidney disease with heart failure and stage 1 through stage 4 chronic kidney disease, or unspecified chronic kidney disease: Secondary | ICD-10-CM | POA: Diagnosis not present

## 2018-10-17 DIAGNOSIS — D631 Anemia in chronic kidney disease: Secondary | ICD-10-CM | POA: Diagnosis not present

## 2018-10-17 DIAGNOSIS — I4891 Unspecified atrial fibrillation: Secondary | ICD-10-CM | POA: Diagnosis not present

## 2018-10-17 DIAGNOSIS — R32 Unspecified urinary incontinence: Secondary | ICD-10-CM | POA: Diagnosis not present

## 2018-10-17 DIAGNOSIS — R296 Repeated falls: Secondary | ICD-10-CM | POA: Diagnosis not present

## 2018-10-17 DIAGNOSIS — E039 Hypothyroidism, unspecified: Secondary | ICD-10-CM | POA: Diagnosis not present

## 2018-10-17 DIAGNOSIS — K219 Gastro-esophageal reflux disease without esophagitis: Secondary | ICD-10-CM | POA: Diagnosis not present

## 2018-10-17 DIAGNOSIS — R159 Full incontinence of feces: Secondary | ICD-10-CM | POA: Diagnosis not present

## 2018-10-17 DIAGNOSIS — F419 Anxiety disorder, unspecified: Secondary | ICD-10-CM | POA: Diagnosis not present

## 2018-10-17 DIAGNOSIS — E43 Unspecified severe protein-calorie malnutrition: Secondary | ICD-10-CM | POA: Diagnosis not present

## 2018-10-17 DIAGNOSIS — Z741 Need for assistance with personal care: Secondary | ICD-10-CM | POA: Diagnosis not present

## 2018-10-17 DIAGNOSIS — Z8679 Personal history of other diseases of the circulatory system: Secondary | ICD-10-CM | POA: Diagnosis not present

## 2018-10-17 DIAGNOSIS — D508 Other iron deficiency anemias: Secondary | ICD-10-CM | POA: Diagnosis not present

## 2018-10-25 DIAGNOSIS — Z03818 Encounter for observation for suspected exposure to other biological agents ruled out: Secondary | ICD-10-CM | POA: Diagnosis not present

## 2018-10-31 DIAGNOSIS — Z03818 Encounter for observation for suspected exposure to other biological agents ruled out: Secondary | ICD-10-CM | POA: Diagnosis not present

## 2018-11-06 DIAGNOSIS — I5022 Chronic systolic (congestive) heart failure: Secondary | ICD-10-CM | POA: Diagnosis not present

## 2018-11-06 DIAGNOSIS — R634 Abnormal weight loss: Secondary | ICD-10-CM | POA: Diagnosis not present

## 2018-11-06 DIAGNOSIS — R296 Repeated falls: Secondary | ICD-10-CM | POA: Diagnosis not present

## 2018-11-06 DIAGNOSIS — E43 Unspecified severe protein-calorie malnutrition: Secondary | ICD-10-CM | POA: Diagnosis not present

## 2018-11-06 DIAGNOSIS — I4891 Unspecified atrial fibrillation: Secondary | ICD-10-CM | POA: Diagnosis not present

## 2018-11-06 DIAGNOSIS — I130 Hypertensive heart and chronic kidney disease with heart failure and stage 1 through stage 4 chronic kidney disease, or unspecified chronic kidney disease: Secondary | ICD-10-CM | POA: Diagnosis not present

## 2018-11-16 DIAGNOSIS — F0390 Unspecified dementia without behavioral disturbance: Secondary | ICD-10-CM | POA: Diagnosis not present

## 2018-11-16 DIAGNOSIS — R32 Unspecified urinary incontinence: Secondary | ICD-10-CM | POA: Diagnosis not present

## 2018-11-16 DIAGNOSIS — Z950 Presence of cardiac pacemaker: Secondary | ICD-10-CM | POA: Diagnosis not present

## 2018-11-16 DIAGNOSIS — Z8679 Personal history of other diseases of the circulatory system: Secondary | ICD-10-CM | POA: Diagnosis not present

## 2018-11-16 DIAGNOSIS — R296 Repeated falls: Secondary | ICD-10-CM | POA: Diagnosis not present

## 2018-11-16 DIAGNOSIS — I5022 Chronic systolic (congestive) heart failure: Secondary | ICD-10-CM | POA: Diagnosis not present

## 2018-11-16 DIAGNOSIS — I130 Hypertensive heart and chronic kidney disease with heart failure and stage 1 through stage 4 chronic kidney disease, or unspecified chronic kidney disease: Secondary | ICD-10-CM | POA: Diagnosis not present

## 2018-11-16 DIAGNOSIS — E43 Unspecified severe protein-calorie malnutrition: Secondary | ICD-10-CM | POA: Diagnosis not present

## 2018-11-16 DIAGNOSIS — K219 Gastro-esophageal reflux disease without esophagitis: Secondary | ICD-10-CM | POA: Diagnosis not present

## 2018-11-16 DIAGNOSIS — R634 Abnormal weight loss: Secondary | ICD-10-CM | POA: Diagnosis not present

## 2018-11-16 DIAGNOSIS — Z8719 Personal history of other diseases of the digestive system: Secondary | ICD-10-CM | POA: Diagnosis not present

## 2018-11-16 DIAGNOSIS — Z741 Need for assistance with personal care: Secondary | ICD-10-CM | POA: Diagnosis not present

## 2018-11-16 DIAGNOSIS — I429 Cardiomyopathy, unspecified: Secondary | ICD-10-CM | POA: Diagnosis not present

## 2018-11-16 DIAGNOSIS — N183 Chronic kidney disease, stage 3 unspecified: Secondary | ICD-10-CM | POA: Diagnosis not present

## 2018-11-16 DIAGNOSIS — M1990 Unspecified osteoarthritis, unspecified site: Secondary | ICD-10-CM | POA: Diagnosis not present

## 2018-11-16 DIAGNOSIS — E039 Hypothyroidism, unspecified: Secondary | ICD-10-CM | POA: Diagnosis not present

## 2018-11-16 DIAGNOSIS — D631 Anemia in chronic kidney disease: Secondary | ICD-10-CM | POA: Diagnosis not present

## 2018-11-16 DIAGNOSIS — R159 Full incontinence of feces: Secondary | ICD-10-CM | POA: Diagnosis not present

## 2018-11-16 DIAGNOSIS — F419 Anxiety disorder, unspecified: Secondary | ICD-10-CM | POA: Diagnosis not present

## 2018-11-16 DIAGNOSIS — Z681 Body mass index (BMI) 19 or less, adult: Secondary | ICD-10-CM | POA: Diagnosis not present

## 2018-11-16 DIAGNOSIS — I4891 Unspecified atrial fibrillation: Secondary | ICD-10-CM | POA: Diagnosis not present

## 2018-11-17 DIAGNOSIS — R296 Repeated falls: Secondary | ICD-10-CM | POA: Diagnosis not present

## 2018-11-23 DIAGNOSIS — N390 Urinary tract infection, site not specified: Secondary | ICD-10-CM | POA: Diagnosis not present

## 2018-11-23 DIAGNOSIS — R319 Hematuria, unspecified: Secondary | ICD-10-CM | POA: Diagnosis not present

## 2018-11-23 DIAGNOSIS — Z79899 Other long term (current) drug therapy: Secondary | ICD-10-CM | POA: Diagnosis not present

## 2018-11-24 DIAGNOSIS — R634 Abnormal weight loss: Secondary | ICD-10-CM | POA: Diagnosis not present

## 2018-11-24 DIAGNOSIS — R296 Repeated falls: Secondary | ICD-10-CM | POA: Diagnosis not present

## 2018-11-24 DIAGNOSIS — F0390 Unspecified dementia without behavioral disturbance: Secondary | ICD-10-CM | POA: Diagnosis not present

## 2018-11-24 DIAGNOSIS — E43 Unspecified severe protein-calorie malnutrition: Secondary | ICD-10-CM | POA: Diagnosis not present

## 2018-11-24 DIAGNOSIS — I4891 Unspecified atrial fibrillation: Secondary | ICD-10-CM | POA: Diagnosis not present

## 2018-11-24 DIAGNOSIS — K5900 Constipation, unspecified: Secondary | ICD-10-CM | POA: Diagnosis not present

## 2018-11-24 DIAGNOSIS — I480 Paroxysmal atrial fibrillation: Secondary | ICD-10-CM | POA: Diagnosis not present

## 2018-11-24 DIAGNOSIS — I130 Hypertensive heart and chronic kidney disease with heart failure and stage 1 through stage 4 chronic kidney disease, or unspecified chronic kidney disease: Secondary | ICD-10-CM | POA: Diagnosis not present

## 2018-11-24 DIAGNOSIS — R627 Adult failure to thrive: Secondary | ICD-10-CM | POA: Diagnosis not present

## 2018-11-24 DIAGNOSIS — E559 Vitamin D deficiency, unspecified: Secondary | ICD-10-CM | POA: Diagnosis not present

## 2018-11-24 DIAGNOSIS — K922 Gastrointestinal hemorrhage, unspecified: Secondary | ICD-10-CM | POA: Diagnosis not present

## 2018-11-24 DIAGNOSIS — R54 Age-related physical debility: Secondary | ICD-10-CM | POA: Diagnosis not present

## 2018-11-24 DIAGNOSIS — I5022 Chronic systolic (congestive) heart failure: Secondary | ICD-10-CM | POA: Diagnosis not present

## 2018-11-24 DIAGNOSIS — D649 Anemia, unspecified: Secondary | ICD-10-CM | POA: Diagnosis not present

## 2018-11-24 DIAGNOSIS — E039 Hypothyroidism, unspecified: Secondary | ICD-10-CM | POA: Diagnosis not present

## 2018-11-25 DIAGNOSIS — E43 Unspecified severe protein-calorie malnutrition: Secondary | ICD-10-CM | POA: Diagnosis not present

## 2018-11-25 DIAGNOSIS — R296 Repeated falls: Secondary | ICD-10-CM | POA: Diagnosis not present

## 2018-11-25 DIAGNOSIS — I130 Hypertensive heart and chronic kidney disease with heart failure and stage 1 through stage 4 chronic kidney disease, or unspecified chronic kidney disease: Secondary | ICD-10-CM | POA: Diagnosis not present

## 2018-11-25 DIAGNOSIS — I4891 Unspecified atrial fibrillation: Secondary | ICD-10-CM | POA: Diagnosis not present

## 2018-11-25 DIAGNOSIS — I5022 Chronic systolic (congestive) heart failure: Secondary | ICD-10-CM | POA: Diagnosis not present

## 2018-11-25 DIAGNOSIS — R634 Abnormal weight loss: Secondary | ICD-10-CM | POA: Diagnosis not present

## 2018-11-27 DIAGNOSIS — R609 Edema, unspecified: Secondary | ICD-10-CM | POA: Diagnosis not present

## 2018-11-28 DIAGNOSIS — Z1159 Encounter for screening for other viral diseases: Secondary | ICD-10-CM | POA: Diagnosis not present

## 2018-12-01 DIAGNOSIS — R627 Adult failure to thrive: Secondary | ICD-10-CM | POA: Diagnosis not present

## 2018-12-08 DIAGNOSIS — I130 Hypertensive heart and chronic kidney disease with heart failure and stage 1 through stage 4 chronic kidney disease, or unspecified chronic kidney disease: Secondary | ICD-10-CM | POA: Diagnosis not present

## 2018-12-08 DIAGNOSIS — I5022 Chronic systolic (congestive) heart failure: Secondary | ICD-10-CM | POA: Diagnosis not present

## 2018-12-08 DIAGNOSIS — E43 Unspecified severe protein-calorie malnutrition: Secondary | ICD-10-CM | POA: Diagnosis not present

## 2018-12-08 DIAGNOSIS — R296 Repeated falls: Secondary | ICD-10-CM | POA: Diagnosis not present

## 2018-12-08 DIAGNOSIS — R634 Abnormal weight loss: Secondary | ICD-10-CM | POA: Diagnosis not present

## 2018-12-08 DIAGNOSIS — I4891 Unspecified atrial fibrillation: Secondary | ICD-10-CM | POA: Diagnosis not present

## 2018-12-12 DIAGNOSIS — R627 Adult failure to thrive: Secondary | ICD-10-CM | POA: Diagnosis not present

## 2018-12-12 DIAGNOSIS — K625 Hemorrhage of anus and rectum: Secondary | ICD-10-CM | POA: Diagnosis not present

## 2018-12-12 DIAGNOSIS — E43 Unspecified severe protein-calorie malnutrition: Secondary | ICD-10-CM | POA: Diagnosis not present

## 2018-12-12 DIAGNOSIS — D649 Anemia, unspecified: Secondary | ICD-10-CM | POA: Diagnosis not present

## 2018-12-12 DIAGNOSIS — I480 Paroxysmal atrial fibrillation: Secondary | ICD-10-CM | POA: Diagnosis not present

## 2018-12-27 DIAGNOSIS — Z03818 Encounter for observation for suspected exposure to other biological agents ruled out: Secondary | ICD-10-CM | POA: Diagnosis not present

## 2018-12-28 ENCOUNTER — Ambulatory Visit (INDEPENDENT_AMBULATORY_CARE_PROVIDER_SITE_OTHER): Payer: Medicare Other | Admitting: *Deleted

## 2018-12-28 DIAGNOSIS — I482 Chronic atrial fibrillation, unspecified: Secondary | ICD-10-CM

## 2018-12-28 DIAGNOSIS — I495 Sick sinus syndrome: Secondary | ICD-10-CM

## 2018-12-28 LAB — CUP PACEART REMOTE DEVICE CHECK
Battery Remaining Longevity: 12 mo
Battery Remaining Percentage: 22 %
Brady Statistic RA Percent Paced: 90 %
Brady Statistic RV Percent Paced: 3 %
Date Time Interrogation Session: 20201112092200
Implantable Lead Implant Date: 20130729
Implantable Lead Implant Date: 20130729
Implantable Lead Location: 753859
Implantable Lead Location: 753860
Implantable Lead Model: 4456
Implantable Lead Model: 4479
Implantable Lead Serial Number: 466820
Implantable Lead Serial Number: 522401
Implantable Pulse Generator Implant Date: 20130729
Lead Channel Impedance Value: 331 Ohm
Lead Channel Impedance Value: 595 Ohm
Lead Channel Pacing Threshold Amplitude: 0.7 V
Lead Channel Pacing Threshold Amplitude: 1 V
Lead Channel Pacing Threshold Pulse Width: 0.4 ms
Lead Channel Pacing Threshold Pulse Width: 0.4 ms
Lead Channel Setting Pacing Amplitude: 1.4 V
Lead Channel Setting Pacing Amplitude: 2.6 V
Lead Channel Setting Pacing Pulse Width: 0.4 ms
Lead Channel Setting Sensing Sensitivity: 1 mV
Pulse Gen Serial Number: 131216

## 2019-01-01 DIAGNOSIS — G894 Chronic pain syndrome: Secondary | ICD-10-CM | POA: Diagnosis not present

## 2019-01-01 DIAGNOSIS — L219 Seborrheic dermatitis, unspecified: Secondary | ICD-10-CM | POA: Diagnosis not present

## 2019-01-16 DEATH — deceased

## 2019-01-19 NOTE — Progress Notes (Signed)
Remote pacemaker transmission.   

## 2019-06-28 ENCOUNTER — Ambulatory Visit (INDEPENDENT_AMBULATORY_CARE_PROVIDER_SITE_OTHER): Payer: Medicare Other | Admitting: *Deleted

## 2019-06-28 DIAGNOSIS — I495 Sick sinus syndrome: Secondary | ICD-10-CM

## 2019-07-02 NOTE — Progress Notes (Signed)
Remote pacemaker transmission.
